# Patient Record
Sex: Female | Born: 1960 | Race: White | Hispanic: No | Marital: Married | Smoking: Former smoker
Health system: Southern US, Community
[De-identification: ages and names within clinical notes are randomized; demographics above are authoritative.]

## PROBLEM LIST (undated history)

## (undated) DIAGNOSIS — Z9989 Dependence on other enabling machines and devices: Secondary | ICD-10-CM

## (undated) DIAGNOSIS — Z83719 Family history of colon polyps, unspecified: Secondary | ICD-10-CM

## (undated) DIAGNOSIS — K589 Irritable bowel syndrome without diarrhea: Secondary | ICD-10-CM

## (undated) DIAGNOSIS — E785 Hyperlipidemia, unspecified: Secondary | ICD-10-CM

## (undated) DIAGNOSIS — K219 Gastro-esophageal reflux disease without esophagitis: Secondary | ICD-10-CM

## (undated) DIAGNOSIS — M549 Dorsalgia, unspecified: Secondary | ICD-10-CM

## (undated) DIAGNOSIS — Z8371 Family history of colonic polyps: Secondary | ICD-10-CM

## (undated) DIAGNOSIS — G4730 Sleep apnea, unspecified: Secondary | ICD-10-CM

## (undated) DIAGNOSIS — R7303 Prediabetes: Secondary | ICD-10-CM

## (undated) DIAGNOSIS — K5792 Diverticulitis of intestine, part unspecified, without perforation or abscess without bleeding: Secondary | ICD-10-CM

## (undated) DIAGNOSIS — M2550 Pain in unspecified joint: Secondary | ICD-10-CM

## (undated) DIAGNOSIS — G4733 Obstructive sleep apnea (adult) (pediatric): Secondary | ICD-10-CM

## (undated) DIAGNOSIS — T7840XA Allergy, unspecified, initial encounter: Secondary | ICD-10-CM

## (undated) DIAGNOSIS — K760 Fatty (change of) liver, not elsewhere classified: Secondary | ICD-10-CM

## (undated) HISTORY — DX: Irritable bowel syndrome, unspecified: K58.9

## (undated) HISTORY — DX: Dorsalgia, unspecified: M54.9

## (undated) HISTORY — DX: Obstructive sleep apnea (adult) (pediatric): G47.33

## (undated) HISTORY — DX: Diverticulitis of intestine, part unspecified, without perforation or abscess without bleeding: K57.92

## (undated) HISTORY — DX: Pain in unspecified joint: M25.50

## (undated) HISTORY — DX: Hyperlipidemia, unspecified: E78.5

## (undated) HISTORY — PX: BONE GRAFT HIP ILIAC CREST: SUR159

## (undated) HISTORY — PX: OTHER SURGICAL HISTORY: SHX169

## (undated) HISTORY — DX: Family history of colonic polyps: Z83.71

## (undated) HISTORY — DX: Allergy, unspecified, initial encounter: T78.40XA

## (undated) HISTORY — DX: Prediabetes: R73.03

## (undated) HISTORY — DX: Sleep apnea, unspecified: G47.30

## (undated) HISTORY — DX: Family history of colon polyps, unspecified: Z83.719

## (undated) HISTORY — DX: Fatty (change of) liver, not elsewhere classified: K76.0

## (undated) HISTORY — DX: Gastro-esophageal reflux disease without esophagitis: K21.9

## (undated) HISTORY — DX: Dependence on other enabling machines and devices: Z99.89

## (undated) HISTORY — PX: WRIST FRACTURE SURGERY: SHX121

---

## 2003-03-10 HISTORY — PX: CHOLECYSTECTOMY: SHX55

## 2003-03-10 HISTORY — PX: GALLBLADDER SURGERY: SHX652

## 2011-08-11 ENCOUNTER — Ambulatory Visit: Payer: Self-pay | Admitting: General Practice

## 2013-08-04 ENCOUNTER — Encounter: Payer: Self-pay | Admitting: Gastroenterology

## 2013-10-06 ENCOUNTER — Ambulatory Visit: Payer: Self-pay | Admitting: Gastroenterology

## 2014-01-02 ENCOUNTER — Ambulatory Visit: Payer: Self-pay | Admitting: General Practice

## 2014-09-28 DIAGNOSIS — M5430 Sciatica, unspecified side: Secondary | ICD-10-CM | POA: Insufficient documentation

## 2014-09-28 DIAGNOSIS — Z79890 Hormone replacement therapy (postmenopausal): Secondary | ICD-10-CM | POA: Insufficient documentation

## 2014-09-28 DIAGNOSIS — N951 Menopausal and female climacteric states: Secondary | ICD-10-CM | POA: Insufficient documentation

## 2014-09-28 DIAGNOSIS — M549 Dorsalgia, unspecified: Secondary | ICD-10-CM | POA: Insufficient documentation

## 2014-09-28 DIAGNOSIS — M7062 Trochanteric bursitis, left hip: Secondary | ICD-10-CM | POA: Insufficient documentation

## 2014-09-28 DIAGNOSIS — K219 Gastro-esophageal reflux disease without esophagitis: Secondary | ICD-10-CM | POA: Insufficient documentation

## 2014-09-28 DIAGNOSIS — M7751 Other enthesopathy of right foot: Secondary | ICD-10-CM | POA: Insufficient documentation

## 2014-10-29 ENCOUNTER — Ambulatory Visit: Payer: Self-pay

## 2014-11-15 ENCOUNTER — Ambulatory Visit: Payer: No Typology Code available for payment source | Attending: Family Medicine

## 2014-11-15 DIAGNOSIS — M25551 Pain in right hip: Secondary | ICD-10-CM | POA: Insufficient documentation

## 2014-11-15 DIAGNOSIS — M25659 Stiffness of unspecified hip, not elsewhere classified: Secondary | ICD-10-CM | POA: Insufficient documentation

## 2014-11-15 DIAGNOSIS — R29898 Other symptoms and signs involving the musculoskeletal system: Secondary | ICD-10-CM | POA: Insufficient documentation

## 2014-11-15 DIAGNOSIS — M25571 Pain in right ankle and joints of right foot: Secondary | ICD-10-CM | POA: Insufficient documentation

## 2014-11-15 NOTE — Patient Instructions (Signed)
HIP: Hamstrings - Short Sitting   Rest leg on raised surface. Keep knee straight. Lift chest. Hold _20__ seconds. _3__ reps per set, _3__ sets per day.  Copyright  VHI. All rights reserved.  Piriformis Stretch, Sitting   Sit, one ankle on opposite knee, same-side hand on crossed knee. Push down on knee, keeping spine straight. Lean torso forward, with flat back, until tension is felt in hamstrings and gluteals of crossed-leg side. Hold _20__ seconds.  Repeat 3___ times per session. Do 3___ sessions per day.  Copyright  VHI. All rights reserved.  Knee to Chest   Lying supine, bend involved knee to chest hold 20 seconds repeat _3__ times. Repeat with other leg. Do _3__ times per day.  Also, repeat with pulling toward opposite shoulder.    Copyright  VHI. All rights reserved.  IT Band: Leg Hang (Side-Lying)   Lie on side with right leg on top. Keep hip and knee straight. Move top leg behind and hang over edge. Hold _20__ seconds. Relax. Repeat _3__ times. Do _3__ times a day. Repeat on other side.    Copyright  VHI. All rights reserved.  [facility] [address], [place], [place] [address] Phone # [phone] Fax [phone]

## 2014-11-15 NOTE — Therapy (Signed)
[facility] [address] [place], [place], [address] Phone: [phone]   Fax:  [phone]  Physical Therapy Evaluation  Patient Details  Name: Sandra Lara MRN: [id number] Date of Birth: September 08, 1960 Referring Provider:  [name], MD  Encounter Date: 11/15/2014      PT End of Session - 11/15/14 1224    Visit Number 1   Date for PT Re-Evaluation 01/10/15   PT Start Time 1148   PT Stop Time 1224   PT Time Calculation (min) 36 min   Activity Tolerance Patient tolerated treatment well   Behavior During Therapy [facility] for tasks assessed/performed      Past Medical History  Diagnosis Date  . Diverticulitis     Past Surgical History  Procedure Laterality Date  . Cholecystectomy  2005  . Wrist fracture surgery Right     There were no vitals filed for this visit.  Visit Diagnosis:  Hip pain, right - Plan: PT plan of care cert/re-cert  Pain in joint, ankle and foot, right - Plan: PT plan of care cert/re-cert  Hip stiffness, unspecified laterality - Plan: PT plan of care cert/re-cert  Weakness of hip, unspecified laterality - Plan: PT plan of care cert/re-cert      Subjective Assessment - 11/15/14 1155    Subjective Pt presents to PT with complaints of Rt lateral hip pain with 7 year history.  Pt lifted her leg at that time and felt a pop.  Pt reports that pain has increaesed over the past 1 year.     Pertinent History injection into Rt bursa 2 weeks ago- this helped   Limitations Sitting   How long can you sit comfortably? 15 minutes    Diagnostic tests x-ray: hip and low back, foot.  Pt reports lumbar OA, Rt hip bursitis   Patient Stated Goals sit longer, less pain, climb steps with greater ease   Currently in Pain? Yes   Pain Score 4    Pain Location Hip   Pain Orientation Right   Pain Descriptors / Indicators Aching;Sore   Pain Type Chronic pain   Pain Onset More than a month ago   Pain  Frequency Intermittent   Aggravating Factors  sitting, climibing steps/ladder, laying on Rt side in bed   Pain Relieving Factors heat, stretching, tennis ball/theracane   Multiple Pain Sites No            [facility] PT Assessment - 11/15/14 0001    Assessment   Medical Diagnosis greater trochanteric bursitis of the Rt hip, postcalcaneal bursisit of Rt foot, back pain with sciatica   Onset Date/Surgical Date 11/14/13   Next MD Visit 12/2014   Precautions   Precautions None   Restrictions   Weight Bearing Restrictions No   Balance Screen   Has the patient fallen in the past 6 months Yes   How many times? 1  getting out of bath tub   Has the patient had a decrease in activity level because of a fear of falling?  No   Is the patient reluctant to leave their home because of a fear of falling?  No   Home Environment   Living Environment Private residence   Living Arrangements Spouse/significant other   Type of [place] One level   Prior Function   Level of Independence Independent   Vocation Full time employment   Development worker, community- desk work   Leisure none  Cognition   Overall Cognitive Status Within Functional Limits for tasks assessed   Observation/Other Assessments   Focus on Therapeutic Outcomes (FOTO)  46% limitation   Posture/Postural Control   Posture/Postural Control No significant limitations   ROM / Strength   AROM / PROM / Strength AROM;PROM;Strength   AROM   Overall AROM  Deficits   Overall AROM Comments Lumbar AROM limited by 10-20% in all directions without pain. Rt ankle AROM is full without pain.   PROM   Overall PROM  Deficits   Overall PROM Comments Bil hip flexibility limited by 25% into IR and hamstring flexibility   Strength   Overall Strength Deficits   Overall Strength Comments Bilateral knee strength 5/5, hip flexors 4/5, ankle strength 5/5 on the Rt   Palpation   Palpation comment Pt wtih palpable tenderness over Rt  ITB and greater trochanter.  No tenderness in deep gluteals.  No tenderness over Rt achilles.  Palpable bump present in posterior Rt ankle.     Ambulation/Gait   Ambulation/Gait Yes   Ambulation/Gait Assistance 7: Independent   Gait Pattern Within Functional Limits                           PT Education - 11/15/14 1224    Education provided Yes   Education Details HEP: single knee and diagonal knee to chest, seated piriformis, ITB stretch, hamstring stretchg   Person(s) Educated Patient   Methods Explanation;Demonstration;Handout   Comprehension Verbalized understanding;Returned demonstration          PT Short Term Goals - 11/15/14 1228    PT SHORT TERM GOAL #1   Title be independent in initial HEP   Time 4   Period Weeks   Status New   PT SHORT TERM GOAL #2   Title sit for 30 minutes without limitation for work   Time 4   Period Weeks   Status New   PT SHORT TERM GOAL #3   Title report a 25% reduction in Rt hip pain with activity   Time 4   Period Weeks   Status New           PT Long Term Goals - 11/15/14 1229    PT LONG TERM GOAL #1   Title be independent in advanced HEP   Time 8   Period Weeks   Status New   PT LONG TERM GOAL #2   Title reduce FOTO to < or = to 39% limitation   Time 8   Period Weeks   Status New   PT LONG TERM GOAL #3   Title sit for 45 minutes to 1 hour without limitation for work   Time 8   Period Weeks   Status New   PT LONG TERM GOAL #4   Title report a 60% reduction in Rt hip pain with activity   Time 8   Period Weeks   Status New   PT LONG TERM GOAL #5   Title ascend and descend steps with step-over-step gait at least 75% of the time   Time 8   Period Weeks   Status New   Additional Long Term Goals   Additional Long Term Goals Yes   PT LONG TERM GOAL #6   Title report a 50% reduction in Rt ankle pain with long periods of walking   Time 8   Period Weeks   Status New  Plan -  11/15/14 1225    Clinical Impression Statement Pt presents to PT with main complaint of chronic Rt hip pain and now diagnosed with trochanteric bursitis.  Pt  also with complatins of Rt ankle pain that she feels is due to altered gait secondary to hip pain. Pt demonstrates stiffness in bilateral hips, painful palpation over Rt lateral hip and thigh.  Pt is limited to sitting < 15 minutes.  FOTO score is 46% limitation.  Pt will benefit from skilled PT for hip flexibility, manual and modalities for pain, Rt ankle flexibility and strength and core stab.     Pt will benefit from skilled therapeutic intervention in order to improve on the following deficits Decreased range of motion;Pain;Decreased strength;Decreased activity tolerance;Impaired flexibility   Rehab Potential Good   PT Frequency 2x / week   PT Duration 8 weeks   PT Treatment/Interventions ADLs/Self Care Home Management;Cryotherapy;Electrical Stimulation;Moist Heat;Therapeutic exercise;Therapeutic activities;Iontophoresis 4mg /ml Dexamethasone;Functional mobility training;Stair training;Neuromuscular re-education;Patient/family education;Manual techniques;Passive range of motion   PT Next Visit Plan Rt ankle flexibility into DF, Rt hip flexibility, manual to Rt ITB, core strength.     Consulted and Agree with Plan of Care Patient         Problem List There are no active problems to display for this patient.   Terressa Evola, PT 11/15/2014, 12:32 PM   [facility] [address], [place], [place], [address] Phone: [phone]   Fax:  [phone]

## 2014-11-20 ENCOUNTER — Ambulatory Visit: Payer: No Typology Code available for payment source | Admitting: Physical Therapy

## 2014-11-20 DIAGNOSIS — M25571 Pain in right ankle and joints of right foot: Secondary | ICD-10-CM

## 2014-11-20 DIAGNOSIS — M25551 Pain in right hip: Secondary | ICD-10-CM | POA: Diagnosis not present

## 2014-11-20 DIAGNOSIS — R29898 Other symptoms and signs involving the musculoskeletal system: Secondary | ICD-10-CM

## 2014-11-20 DIAGNOSIS — M25659 Stiffness of unspecified hip, not elsewhere classified: Secondary | ICD-10-CM

## 2014-11-20 NOTE — Patient Instructions (Signed)
  Abduction: Clam (Eccentric) - Side-Lying   Lie on side with knees bent. Lift top knee, keeping feet together. Keep trunk steady. Slowly lower for 3-5 seconds. ___ reps per set, ___ sets per day, ___ days per week. Add ___ lbs when you achieve ___ repetitions.  Copyright  VHI. All rights reserved.  Abduction   Lift leg up toward ceiling. Return. Use ____ lbs on ankle. Repeat ____ times each leg. Do ____ sessions per day.  http://gt2.exer.us/386   Copyright  VHI. All rights reserved.  Bridge   Lie back, legs bent. Inhale, pressing hips up. Keeping ribs in, lengthen lower back. Exhale, rolling down along spine from top. Repeat ____ times. Do ____ sessions per day.  Copyright  VHI. All rights reserved.

## 2014-11-20 NOTE — Therapy (Signed)
[facility] [address] [place], [place], [address] Phone: [phone]   Fax:  [phone]  Physical Therapy Treatment  Patient Details  Name: Sandra Lara MRN: [id number] Date of Birth: [birth year] Referring Provider:  [name], MD  Encounter Date: 11/20/2014      PT End of Session - 11/20/14 0842    Visit Number 2   Date for PT Re-Evaluation 01/10/15   PT Start Time 0800   PT Stop Time 0850   PT Time Calculation (min) 50 min   Activity Tolerance Patient tolerated treatment well   Behavior During Therapy [facility] for tasks assessed/performed      Past Medical History  Diagnosis Date  . Diverticulitis     Past Surgical History  Procedure Laterality Date  . Cholecystectomy  2005  . Wrist fracture surgery Right     There were no vitals filed for this visit.  Visit Diagnosis:  Hip pain, right  Pain in joint, ankle and foot, right  Hip stiffness, unspecified laterality  Weakness of hip, unspecified laterality      Subjective Assessment - 11/20/14 0815    Subjective Stretches have helped, still with difficulty sleeping on Rt side, sitting for long periods  Pt states Rt ankle feels better since doing stretches                         [facility] Adult PT Treatment/Exercise - 11/20/14 0001    Lumbar Exercises: Stretches   Active Hamstring Stretch 2 reps;30 seconds   Single Knee to Chest Stretch 2 reps;30 seconds   ITB Stretch 2 reps;30 seconds   Piriformis Stretch 2 reps;30 seconds   Lumbar Exercises: Supine   Bridge 10 reps;3 seconds   Other Supine Lumbar Exercises bridge with ball squeeze 10x 3 sec   Other Supine Lumbar Exercises bridge with march 10 x   Knee/Hip Exercises: Aerobic   Stationary Bike nustep level 2 x 5 minutes   Knee/Hip Exercises: Sidelying   Hip ABduction Right;2 sets;10 reps   Clams 2 x 10   Moist Heat Therapy   Number Minutes Moist Heat 10 Minutes   Moist Heat Location Hip   Manual Therapy   Soft tissue mobilization Rt ITB, Rt piriformis                PT Education - 11/20/14 0842    Education provided Yes   Education Details HEP   Methods Explanation;Demonstration;Handout   Comprehension Returned demonstration;Verbalized understanding          PT Short Term Goals - 11/20/14 0844    PT SHORT TERM GOAL #1   Status On-going   PT SHORT TERM GOAL #2   Status On-going   PT SHORT TERM GOAL #3   Status On-going           PT Long Term Goals - 11/15/14 1229    PT LONG TERM GOAL #1   Title be independent in advanced HEP   Time 8   Period Weeks   Status New   PT LONG TERM GOAL #2   Title reduce FOTO to < or = to 39% limitation   Time 8   Period Weeks   Status New   PT LONG TERM GOAL #3   Title sit for 45 minutes to 1 hour without limitation for work   Time 8   Period Weeks   Status New   PT LONG TERM GOAL #  4   Title report a 60% reduction in Rt hip pain with activity   Time 8   Period Weeks   Status New   PT LONG TERM GOAL #5   Title ascend and descend steps with step-over-step gait at least 75% of the time   Time 8   Period Weeks   Status New   Additional Long Term Goals   Additional Long Term Goals Yes   PT LONG TERM GOAL #6   Title report a 50% reduction in Rt ankle pain with long periods of walking   Time 8   Period Weeks   Status New               Plan - 11/20/14 7341    Clinical Impression Statement Pt able to tolerate core and hip strengthening exercises easily.  Still with tightness in ITB and piriformis limited ability to sit for long periods and sleep on Rt side.   PT Frequency 2x / week   PT Duration 8 weeks   PT Treatment/Interventions ADLs/Self Care Home Management;Cryotherapy;Electrical Stimulation;Moist Heat;Therapeutic exercise;Therapeutic activities;Iontophoresis 4mg /ml Dexamethasone;Functional mobility training;Stair training;Neuromuscular re-education;Patient/family  education;Manual techniques;Passive range of motion   PT Next Visit Plan Continue to progress core and hip strengthening        Problem List There are no active problems to display for this patient.   Mehmet Scally 11/20/2014, 9:27 AM  [facility] [address] [place], [place], [address] Phone: [phone]   Fax:  [phone]

## 2014-11-22 ENCOUNTER — Ambulatory Visit: Payer: No Typology Code available for payment source

## 2014-11-22 DIAGNOSIS — R29898 Other symptoms and signs involving the musculoskeletal system: Secondary | ICD-10-CM

## 2014-11-22 DIAGNOSIS — M25571 Pain in right ankle and joints of right foot: Secondary | ICD-10-CM

## 2014-11-22 DIAGNOSIS — M25659 Stiffness of unspecified hip, not elsewhere classified: Secondary | ICD-10-CM

## 2014-11-22 DIAGNOSIS — M25551 Pain in right hip: Secondary | ICD-10-CM | POA: Diagnosis not present

## 2014-11-22 NOTE — Therapy (Signed)
[facility] [address] [place], [place], [address] Phone: [phone]   Fax:  [phone]  Physical Therapy Treatment  Patient Details  Name: Eufelia Veno MRN: [id number] Date of Birth: Jun 18, 1960 Referring Provider:  [name], MD  Encounter Date: 11/22/2014      PT End of Session - 11/22/14 0842    Visit Number 3   Date for PT Re-Evaluation 01/10/15   PT Start Time 0803   PT Stop Time 0856   PT Time Calculation (min) 53 min   Activity Tolerance Patient tolerated treatment well   Behavior During Therapy [facility] for tasks assessed/performed      Past Medical History  Diagnosis Date  . Diverticulitis     Past Surgical History  Procedure Laterality Date  . Cholecystectomy  2005  . Wrist fracture surgery Right     There were no vitals filed for this visit.  Visit Diagnosis:  Hip pain, right  Pain in joint, ankle and foot, right  Hip stiffness, unspecified laterality  Weakness of hip, unspecified laterality      Subjective Assessment - 11/22/14 0815    Subjective Pt feels that hip is feeling better.  No pain today.     Currently in Pain? No/denies                         [facility] Adult PT Treatment/Exercise - 11/22/14 0001    Exercises   Exercises Ankle   Lumbar Exercises: Stretches   Active Hamstring Stretch 2 reps;30 seconds   ITB Stretch 2 reps;30 seconds  with strap   Piriformis Stretch 2 reps;30 seconds   Lumbar Exercises: Aerobic   Stationary Bike Level 2 x 8 minutes   Lumbar Exercises: Supine   Bridge 10 reps;3 seconds   Other Supine Lumbar Exercises bridge with ball squeeze 10x 3 sec   Other Supine Lumbar Exercises bridge with march 10 x   Lumbar Exercises: Sidelying   Clam 20 reps   Knee/Hip Exercises: Standing   Rebounder weight shifting 3 ways x 1 minute each   Moist Heat Therapy   Number Minutes Moist Heat 14 Minutes   Moist Heat Location Hip   Manual Therapy   Soft tissue mobilization Rt ITB, Rt piriformis   Ankle Exercises: Stretches   Gastroc Stretch 5 reps;10 seconds  on edge of step                  PT Short Term Goals - 11/20/14 0844    PT SHORT TERM GOAL #1   Status On-going   PT SHORT TERM GOAL #2   Status On-going   PT SHORT TERM GOAL #3   Status On-going           PT Long Term Goals - 11/15/14 1229    PT LONG TERM GOAL #1   Title be independent in advanced HEP   Time 8   Period Weeks   Status New   PT LONG TERM GOAL #2   Title reduce FOTO to < or = to 39% limitation   Time 8   Period Weeks   Status New   PT LONG TERM GOAL #3   Title sit for 45 minutes to 1 hour without limitation for work   Time 8   Period Weeks   Status New   PT LONG TERM GOAL #4   Title report a 60% reduction in Rt hip pain with activity  Time 8   Period Weeks   Status New   PT LONG TERM GOAL #5   Title ascend and descend steps with step-over-step gait at least 75% of the time   Time 8   Period Weeks   Status New   Additional Long Term Goals   Additional Long Term Goals Yes   PT LONG TERM GOAL #6   Title report a 50% reduction in Rt ankle pain with long periods of walking   Time 8   Period Weeks   Status New               Plan - 11/22/14 5631    Clinical Impression Statement Pt without pain in hip or ankle today.  Pt able to demo all HEP correctly and is compliant with HEP.  Pt with continued tenderness in Rt ITB and piriformis that limits long periods and sleep on the Rt side.  Pt will benefit from skilled PT for flexibility, strength, manual and modalities.     Pt will benefit from skilled therapeutic intervention in order to improve on the following deficits Decreased range of motion;Pain;Decreased strength;Decreased activity tolerance;Impaired flexibility   PT Frequency 2x / week   PT Duration 8 weeks   PT Treatment/Interventions ADLs/Self Care Home Management;Cryotherapy;Electrical  Stimulation;Moist Heat;Therapeutic exercise;Therapeutic activities;Iontophoresis 4mg /ml Dexamethasone;Functional mobility training;Stair training;Neuromuscular re-education;Patient/family education;Manual techniques;Passive range of motion   PT Next Visit Plan Continue to progress core and hip strengthening.  Manual and modalites.   Consulted and Agree with Plan of Care Patient        Problem List There are no active problems to display for this patient.   TAKACS,KELLY, PT 11/22/2014, 8:43 AM  [facility] [address], [place], [place], [address] Phone: [phone]   Fax:  [phone]

## 2014-11-28 ENCOUNTER — Ambulatory Visit: Payer: No Typology Code available for payment source

## 2014-11-28 DIAGNOSIS — M25571 Pain in right ankle and joints of right foot: Secondary | ICD-10-CM

## 2014-11-28 DIAGNOSIS — M25659 Stiffness of unspecified hip, not elsewhere classified: Secondary | ICD-10-CM

## 2014-11-28 DIAGNOSIS — R29898 Other symptoms and signs involving the musculoskeletal system: Secondary | ICD-10-CM

## 2014-11-28 DIAGNOSIS — M25551 Pain in right hip: Secondary | ICD-10-CM | POA: Diagnosis not present

## 2014-11-28 NOTE — Patient Instructions (Signed)
  ABDUCTION: Standing (Active)   Stand, feet flat. Lift right leg out to side. Use _0__ lbs. Complete __10_ repetitions. Perform __2_ sessions per day.     EXTENSION: Standing (Active)  Stand, both feet flat. Draw right leg behind body as far as possible. Use 0___ lbs. Complete 10 repetitions. Perform __2_ sessions per day.  Copyright  VHI. All rights reserved.   [facility] [address], [address] [place], [place] [address] Phone # [phone] Fax [phone] 

## 2014-11-28 NOTE — Therapy (Signed)
[facility] [address], [place], [place], [address] Phone: [phone]   Fax:  [phone]  Physical Therapy Treatment  Patient Details  Name: Sandra Lara MRN: [id number] Date of Birth: [birth year] Referring Provider:  [name], MD  Encounter Date: 11/28/2014      PT End of Session - 11/28/14 0834    Visit Number 4   Date for PT Re-Evaluation 01/10/15   PT Start Time 0759   PT Stop Time 0852   PT Time Calculation (min) 53 min   Activity Tolerance Patient tolerated treatment well   Behavior During Therapy [facility] for tasks assessed/performed      Past Medical History  Diagnosis Date  . Diverticulitis     Past Surgical History  Procedure Laterality Date  . Cholecystectomy  2005  . Wrist fracture surgery Right     There were no vitals filed for this visit.  Visit Diagnosis:  Hip pain, right  Pain in joint, ankle and foot, right  Hip stiffness, unspecified laterality  Weakness of hip, unspecified laterality      Subjective Assessment - 11/28/14 0801    Subjective No pain.  Doing well with exercises.     Currently in Pain? No/denies                         [facility] Adult PT Treatment/Exercise - 11/28/14 0001    Lumbar Exercises: Stretches   Active Hamstring Stretch 2 reps;30 seconds  using step for stretch and strap in supine   Piriformis Stretch 2 reps;30 seconds  seated   Lumbar Exercises: Aerobic   Stationary Bike Level 2 x 8 minutes   Lumbar Exercises: Supine   Bridge 20 reps;5 seconds;Other (comment)  with ball squeeze   Knee/Hip Exercises: Standing   Heel Raises 2 sets;10 reps   Hip Abduction 2 sets;10 reps;Both   Hip Extension Both;2 sets;10 reps   Rebounder weight shifting 3 ways x 1 minute each   Knee/Hip Exercises: Sidelying   Clams 3x10 with yellow band.   issued yellow band for HEP   Moist Heat Therapy   Number Minutes Moist Heat 12 Minutes   Moist  Heat Location Hip   Manual Therapy   Soft tissue mobilization Rt ITB, Rt piriformis  using orange roller                PT Education - 11/28/14 0821    Education provided Yes   Education Details HEP: standing hip abduction and extension   Person(s) Educated Patient   Methods Explanation;Demonstration;Handout   Comprehension Verbalized understanding;Returned demonstration          PT Short Term Goals - 11/28/14 0801    PT SHORT TERM GOAL #1   Title be independent in initial HEP   Status Achieved   PT SHORT TERM GOAL #2   Title sit for 30 minutes without limitation for work   Status Achieved   PT SHORT TERM GOAL #3   Title report a 25% reduction in Rt hip pain with activity   Status Achieved           PT Long Term Goals - 11/15/14 1229    PT LONG TERM GOAL #1   Title be independent in advanced HEP   Time 8   Period Weeks   Status New   PT LONG TERM GOAL #2   Title reduce FOTO to < or = to 39% limitation  Time 8   Period Weeks   Status New   PT LONG TERM GOAL #3   Title sit for 45 minutes to 1 hour without limitation for work   Time 8   Period Weeks   Status New   PT LONG TERM GOAL #4   Title report a 60% reduction in Rt hip pain with activity   Time 8   Period Weeks   Status New   PT LONG TERM GOAL #5   Title ascend and descend steps with step-over-step gait at least 75% of the time   Time 8   Period Weeks   Status New   Additional Long Term Goals   Additional Long Term Goals Yes   PT LONG TERM GOAL #6   Title report a 50% reduction in Rt ankle pain with long periods of walking   Time 8   Period Weeks   Status New               Plan - 11/28/14 0802    Clinical Impression Statement Pt without pain x 1 week.  Pt is able to sit for 30 minutes at work without limitation.  Pt reports 30% reduciton in hip pain since the start of pain . Pain only with sidelying on the Rt.  Pt with continued tenderness in the Rt ITB and piriformis and  limites sitting for long periods and sleep on the Rt side.  Pt will benefit from skilled PT for flexibility, strength, manual and modalities.     Pt will benefit from skilled therapeutic intervention in order to improve on the following deficits Decreased range of motion;Pain;Decreased strength;Decreased activity tolerance;Impaired flexibility   Rehab Potential Good   PT Frequency 2x / week   PT Duration 8 weeks   PT Treatment/Interventions ADLs/Self Care Home Management;Cryotherapy;Electrical Stimulation;Moist Heat;Therapeutic exercise;Therapeutic activities;Iontophoresis 4mg /ml Dexamethasone;Functional mobility training;Stair training;Neuromuscular re-education;Patient/family education;Manual techniques;Passive range of motion   PT Next Visit Plan Continue to progress core and hip strengthening.  Manual and modalites.   Consulted and Agree with Plan of Care Patient        Problem List There are no active problems to display for this patient.   TAKACS,KELLY,PT 11/28/2014, 8:41 AM  [facility] [address] [place], [place], [address] Phone: [phone]   Fax:  [phone]

## 2014-12-11 ENCOUNTER — Ambulatory Visit: Payer: No Typology Code available for payment source | Attending: Family Medicine

## 2014-12-11 DIAGNOSIS — R29898 Other symptoms and signs involving the musculoskeletal system: Secondary | ICD-10-CM | POA: Insufficient documentation

## 2014-12-11 DIAGNOSIS — M25551 Pain in right hip: Secondary | ICD-10-CM | POA: Diagnosis present

## 2014-12-11 DIAGNOSIS — M25571 Pain in right ankle and joints of right foot: Secondary | ICD-10-CM

## 2014-12-11 DIAGNOSIS — M25659 Stiffness of unspecified hip, not elsewhere classified: Secondary | ICD-10-CM | POA: Diagnosis present

## 2014-12-11 NOTE — Therapy (Signed)
[facility] [address], [place], [place], [address] Phone: [phone]   Fax:  [phone]  Physical Therapy Treatment  Patient Details  Name: Sandra Lara MRN: [id number] Date of Birth: 02-Jun-1960 Referring Provider:  [name], MD  Encounter Date: 12/11/2014      PT End of Session - 12/11/14 0840    Visit Number 5   Date for PT Re-Evaluation 01/10/15   PT Start Time 0801   PT Stop Time 3888   PT Time Calculation (min) 54 min   Activity Tolerance Patient tolerated treatment well   Behavior During Therapy [facility] for tasks assessed/performed      Past Medical History  Diagnosis Date  . Diverticulitis     Past Surgical History  Procedure Laterality Date  . Cholecystectomy  2005  . Wrist fracture surgery Right     There were no vitals filed for this visit.  Visit Diagnosis:  Hip pain, right  Pain in joint, ankle and foot, right  Hip stiffness, unspecified laterality  Weakness of hip, unspecified laterality      Subjective Assessment - 12/11/14 0809    Subjective Pt was on vacation to the beach.  Walked a lot on unlevel surface at [employer].  Hasn't been stretching as much.     Currently in Pain? Yes   Pain Score 4    Pain Location Hip   Pain Orientation Right   Pain Descriptors / Indicators Aching;Sore   Pain Type Chronic pain   Pain Onset More than a month ago   Pain Frequency Intermittent   Aggravating Factors  sitting, walking on unlevel surface   Pain Relieving Factors heat, stretching                         [facility] Adult PT Treatment/Exercise - 12/11/14 0001    Lumbar Exercises: Stretches   Active Hamstring Stretch 2 reps;30 seconds  using step for stretch and strap in supine   Lumbar Exercises: Aerobic   Stationary Bike Level 2 x 8 minutes   Lumbar Exercises: Supine   Bridge 20 reps;5 seconds;Other (comment)  with ball squeeze   Other Supine Lumbar  Exercises bridge with ball squeeze 10x 3 sec   Lumbar Exercises: Sidelying   Clam 20 reps  yellow band   Knee/Hip Exercises: Standing   Heel Raises 2 sets;10 reps   Hip Abduction 2 sets;10 reps;Both   Hip Extension Both;2 sets;10 reps   Knee/Hip Exercises: Sidelying   Clams 3x10 with yellow band.   issued yellow band for HEP   Moist Heat Therapy   Number Minutes Moist Heat 12 Minutes   Moist Heat Location Hip   Manual Therapy   Soft tissue mobilization Rt ITB, Rt piriformis  using orange roller   Ankle Exercises: Stretches   Gastroc Stretch 5 reps;10 seconds  on edge of step                  PT Short Term Goals - 11/28/14 0801    PT SHORT TERM GOAL #1   Title be independent in initial HEP   Status Achieved   PT SHORT TERM GOAL #2   Title sit for 30 minutes without limitation for work   Status Achieved   PT SHORT TERM GOAL #3   Title report a 25% reduction in Rt hip pain with activity   Status Achieved  PT Long Term Goals - 12/11/14 0810    PT LONG TERM GOAL #1   Title be independent in advanced HEP   Time 8   Period Weeks   Status On-going  pt has current HEP and was not compliant while on vacation   PT LONG TERM GOAL #3   Title sit for 45 minutes to 1 hour without limitation for work   Time 8   Period Weeks   Status On-going  30 minutes with hip pain   PT LONG TERM GOAL #4   Title report a 60% reduction in Rt hip pain with activity   Time 8   Period Weeks   Status On-going  40% reduction   PT LONG TERM GOAL #5   Title ascend and descend steps with step-over-step gait at least 75% of the time   Status Achieved               Plan - 12/11/14 0813    Clinical Impression Statement Pt reports 40% reduction in Rt hip pain since the start of care.  Sitting remains limited to 30 minutes at work.  Pt has not been stretching as much due to being on vacation.  Pt with continued tenderness in the Rt ITB and piriformis and this limits  sitting for long periods of teim.  Pt will benefit from skilled PT for flexibiity and strength progression and manual for pain management.   Pt will benefit from skilled therapeutic intervention in order to improve on the following deficits Decreased range of motion;Pain;Decreased strength;Decreased activity tolerance;Impaired flexibility   Rehab Potential Good   PT Frequency 2x / week   PT Duration 8 weeks   PT Treatment/Interventions ADLs/Self Care Home Management;Cryotherapy;Electrical Stimulation;Moist Heat;Therapeutic exercise;Therapeutic activities;Iontophoresis 4mg /ml Dexamethasone;Functional mobility training;Stair training;Neuromuscular re-education;Patient/family education;Manual techniques;Passive range of motion   PT Next Visit Plan Continue to progress core and hip strengthening.  Manual and modalites.   Consulted and Agree with Plan of Care Patient        Problem List There are no active problems to display for this patient.   TAKACS,KELLY, PT 12/11/2014, 8:41 AM  [facility] [address] [place], [place], [address] Phone: [phone]   Fax:  [phone]

## 2014-12-13 ENCOUNTER — Encounter: Payer: No Typology Code available for payment source | Admitting: Physical Therapy

## 2014-12-18 ENCOUNTER — Ambulatory Visit: Payer: No Typology Code available for payment source

## 2014-12-18 DIAGNOSIS — M25551 Pain in right hip: Secondary | ICD-10-CM | POA: Diagnosis not present

## 2014-12-18 DIAGNOSIS — M25571 Pain in right ankle and joints of right foot: Secondary | ICD-10-CM

## 2014-12-18 DIAGNOSIS — M25659 Stiffness of unspecified hip, not elsewhere classified: Secondary | ICD-10-CM

## 2014-12-18 DIAGNOSIS — R29898 Other symptoms and signs involving the musculoskeletal system: Secondary | ICD-10-CM

## 2014-12-18 NOTE — Therapy (Signed)
[facility] [address] [place], [place], [address] Phone: [phone]   Fax:  [phone]  Physical Therapy Treatment  Patient Details  Name: Sandra Lara MRN: [id number] Date of Birth: September 08, 1960 Referring Provider:  [name], MD  Encounter Date: 12/18/2014      PT End of Session - 12/18/14 0828    Visit Number 6   Date for PT Re-Evaluation 01/10/15   PT Start Time 0759   PT Stop Time 0852   PT Time Calculation (min) 53 min   Activity Tolerance Patient tolerated treatment well   Behavior During Therapy [facility] for tasks assessed/performed      Past Medical History  Diagnosis Date  . Diverticulitis     Past Surgical History  Procedure Laterality Date  . Cholecystectomy  2005  . Wrist fracture surgery Right     There were no vitals filed for this visit.  Visit Diagnosis:  Hip pain, right  Hip stiffness, unspecified laterality  Pain in joint, ankle and foot, right  Weakness of hip, unspecified laterality      Subjective Assessment - 12/18/14 0808    Subjective Pt reports aching this morning due to cold weather   Currently in Pain? Yes   Pain Score 5    Pain Location Hip   Pain Orientation Right   Pain Descriptors / Indicators Aching;Sore   Pain Type Chronic pain   Pain Onset More than a month ago   Pain Frequency Intermittent   Aggravating Factors  sitting, walking on unlevel surface   Pain Relieving Factors heat, stretching                         [facility] Adult PT Treatment/Exercise - 12/18/14 0001    Lumbar Exercises: Stretches   Active Hamstring Stretch 2 reps;30 seconds  using step for stretch and strap in supine   Lumbar Exercises: Aerobic   Stationary Bike Level 2 x 8 minutes   Lumbar Exercises: Supine   Bridge 20 reps;5 seconds;Other (comment)  added hip abduction to bridge   Lumbar Exercises: Sidelying   Clam 20 reps  red band   Moist Heat Therapy    Number Minutes Moist Heat 12 Minutes   Moist Heat Location Hip   Manual Therapy   Soft tissue mobilization Rt ITB, Rt piriformis  using orange roller   Ankle Exercises: Stretches   Gastroc Stretch 5 reps;10 seconds  on edge of step                  PT Short Term Goals - 11/28/14 0801    PT SHORT TERM GOAL #1   Title be independent in initial HEP   Status Achieved   PT SHORT TERM GOAL #2   Title sit for 30 minutes without limitation for work   Status Achieved   PT SHORT TERM GOAL #3   Title report a 25% reduction in Rt hip pain with activity   Status Achieved           PT Long Term Goals - 12/18/14 0809    PT LONG TERM GOAL #1   Title be independent in advanced HEP   Time 8   Period Weeks   Status On-going   PT LONG TERM GOAL #2   Title reduce FOTO to < or = to 39% limitation   Time 8   Period Weeks   Status On-going  not tested yet  PT LONG TERM GOAL #3   Title sit for 45 minutes to 1 hour without limitation for work   Time 8   Period Weeks   Status On-going   PT LONG TERM GOAL #4   Title report a 60% reduction in Rt hip pain with activity   Time 8   Period Weeks   Status On-going  50% improvement   PT LONG TERM GOAL #5   Title ascend and descend steps with step-over-step gait at least 75% of the time   Status Achieved   PT LONG TERM GOAL #6   Title report a 50% reduction in Rt ankle pain with long periods of walking   Time 8   Period Weeks   Status Achieved  60% better               Plan - 12/18/14 0820    Clinical Impression Statement Pt reports 50% reduction in Rt hip pain and 60% reduction in Rt ankle pain since the start of care. Pt reports that sitting remains limited to 30 minutes at work.  Pt has resumed her stretching routine and HEP for strength.  PT with continued tenderness in the Rt ITB and piriformis and this limits sitting long periods of time.  Pt tolerated increased resistance with clam shells and abduction with  bridging.  Pt will benefit from skilled PT for flexibility and strength progression and manual therapy for pain management.     Pt will benefit from skilled therapeutic intervention in order to improve on the following deficits Decreased range of motion;Pain;Decreased strength;Decreased activity tolerance;Impaired flexibility   Rehab Potential Good   PT Frequency 2x / week   PT Duration 8 weeks   PT Treatment/Interventions ADLs/Self Care Home Management;Cryotherapy;Electrical Stimulation;Moist Heat;Therapeutic exercise;Therapeutic activities;Iontophoresis 4mg /ml Dexamethasone;Functional mobility training;Stair training;Neuromuscular re-education;Patient/family education;Manual techniques;Passive range of motion   PT Next Visit Plan Continue to progress core and hip strengthening.  Manual and modalites.   Consulted and Agree with Plan of Care Patient        Problem List There are no active problems to display for this patient.   TAKACS,KELLY, PT 12/18/2014, 8:38 AM  [facility] [address] [place], [place], [address] Phone: [phone]   Fax:  [phone]

## 2014-12-27 ENCOUNTER — Ambulatory Visit: Payer: No Typology Code available for payment source | Admitting: Physical Therapy

## 2014-12-27 DIAGNOSIS — M25551 Pain in right hip: Secondary | ICD-10-CM | POA: Diagnosis not present

## 2014-12-27 DIAGNOSIS — R29898 Other symptoms and signs involving the musculoskeletal system: Secondary | ICD-10-CM

## 2014-12-27 DIAGNOSIS — M25571 Pain in right ankle and joints of right foot: Secondary | ICD-10-CM

## 2014-12-27 DIAGNOSIS — M25659 Stiffness of unspecified hip, not elsewhere classified: Secondary | ICD-10-CM

## 2014-12-27 NOTE — Therapy (Addendum)
[facility] [address] [place], [place], [address] Phone: [phone]   Fax:  [phone]  Physical Therapy Treatment  Patient Details  Name: Sandra Lara MRN: [id number] Date of Birth: 1960-07-09 No Data Ernst Bowler, MD  Encounter Date: 12/27/2014      PT End of Session - 12/27/14 0842    Visit Number 7   Date for PT Re-Evaluation 01/10/15   PT Start Time 0759   PT Stop Time 0840   PT Time Calculation (min) 41 min   Activity Tolerance Patient tolerated treatment well   Behavior During Therapy [facility] for tasks assessed/performed      Past Medical History  Diagnosis Date  . Diverticulitis     Past Surgical History  Procedure Laterality Date  . Cholecystectomy  2005  . Wrist fracture surgery Right     There were no vitals filed for this visit.  Visit Diagnosis:  Hip pain, right  Hip stiffness, unspecified laterality  Pain in joint, ankle and foot, right  Weakness of hip, unspecified laterality      Subjective Assessment - 12/27/14 0804    Subjective Pt reports Lt knee hurt after last session, Rt hip feeling better   Currently in Pain? Yes   Pain Score 5    Pain Location Hip   Pain Orientation Right   Pain Descriptors / Indicators Aching   Pain Type Chronic pain   Pain Onset More than a month ago                         [facility] Adult PT Treatment/Exercise - 12/27/14 0001    Lumbar Exercises: Stretches   Active Hamstring Stretch 2 reps;30 seconds  using step to stretch   Lumbar Exercises: Aerobic   Stationary Bike level 2 x 8 minutes   Lumbar Exercises: Supine   Bridge 20 reps;5 seconds  10 reps with ball squeeze   Other Supine Lumbar Exercises bridge with march x 10   Lumbar Exercises: Sidelying   Clam 20 reps  red band, Lt sidelying only due to Rt pain   Knee/Hip Exercises: Standing   Hip ADduction Right;10 reps  red band   Hip Abduction Right;10 reps   red band   Hip Extension Right;10 reps  red band   Manual Therapy   Soft tissue mobilization R ITB and piriformis  orange roller   Ankle Exercises: Stretches   Gastroc Stretch 5 reps;10 seconds  on edge of step                  PT Short Term Goals - 12/27/14 0844    PT SHORT TERM GOAL #1   Title be independent in initial HEP   Status Achieved   PT SHORT TERM GOAL #2   Title sit for 30 minutes without limitation for work   Status Achieved   PT SHORT TERM GOAL #3   Title report a 25% reduction in Rt hip pain with activity   Status Achieved           PT Long Term Goals - 12/27/14 0844    PT LONG TERM GOAL #1   Title be independent in advanced HEP   Time 8   Period Weeks   Status On-going   PT LONG TERM GOAL #2   Title reduce FOTO to < or = to 39% limitation   Time 8   Period Weeks   Status On-going  PT LONG TERM GOAL #3   Title sit for 45 minutes to 1 hour without limitation for work   Time 8   Period Weeks   Status On-going   PT LONG TERM GOAL #4   Title report a 60% reduction in Rt hip pain with activity   Time 8   Period Weeks   Status On-going   PT LONG TERM GOAL #5   Title ascend and descend steps with step-over-step gait at least 75% of the time   PT LONG TERM GOAL #6   Title report a 50% reduction in Rt ankle pain with long periods of walking   Status Achieved               Plan - 12/27/14 0842    Clinical Impression Statement Exercises modified due to c/o Lt knee pain after last session, pt able to tolerate standing hip exercises with red T band without pain. Pt still reports being limited to 30 minutes of sitting at work.  Pt will continue to benefit from stretching, strengthening and manual therapy. Pt declined heat today due to needing to get to work   Pt will benefit from skilled therapeutic intervention in order to improve on the following deficits Decreased range of motion;Pain;Decreased strength;Decreased activity  tolerance;Impaired flexibility   Rehab Potential Good   PT Frequency 2x / week   PT Duration 8 weeks   PT Treatment/Interventions ADLs/Self Care Home Management;Cryotherapy;Electrical Stimulation;Moist Heat;Therapeutic exercise;Therapeutic activities;Iontophoresis 65m/ml Dexamethasone;Functional mobility training;Stair training;Neuromuscular re-education;Patient/family education;Manual techniques;Passive range of motion   PT Next Visit Plan assess standing hip exercises, progress core and hip strength as tolerated   Consulted and Agree with Plan of Care Patient        Problem List There are no active problems to display for this patient.   Sandra Lara PT, DPT  12/27/2014, 8:46 AM PHYSICAL THERAPY DISCHARGE SUMMARY  Visits from Start of Care: 7  Current functional level related to goals / functional outcomes: See above for most current goal status.  Pt has HEP in place for hip and ankle flexibility and strength.     Remaining deficits: Limited to 30 minutes of sitting at work.  Pt has HEP in place and will follow-up with MD as needed.     Education / Equipment: HEP Plan: Patient agrees to discharge.  Patient goals were partially met. Patient is being discharged due to being pleased with the current functional level.  ?????   Sandra Lara PT 01/10/2015 7:27 AM  [facility] [address] [place] [address]: [phone]  Fax:  [phone] Name: Sandra YarbroMRN: [id number] of Birth: [birth year]

## 2015-01-01 ENCOUNTER — Ambulatory Visit: Payer: No Typology Code available for payment source

## 2015-05-24 DIAGNOSIS — K5730 Diverticulosis of large intestine without perforation or abscess without bleeding: Secondary | ICD-10-CM | POA: Insufficient documentation

## 2015-11-22 LAB — HM COLONOSCOPY

## 2015-11-26 DIAGNOSIS — D126 Benign neoplasm of colon, unspecified: Secondary | ICD-10-CM | POA: Insufficient documentation

## 2015-12-13 ENCOUNTER — Other Ambulatory Visit: Payer: Self-pay | Admitting: Family Medicine

## 2015-12-13 DIAGNOSIS — Z1231 Encounter for screening mammogram for malignant neoplasm of breast: Secondary | ICD-10-CM

## 2015-12-30 ENCOUNTER — Encounter (HOSPITAL_COMMUNITY): Payer: Self-pay

## 2015-12-30 ENCOUNTER — Ambulatory Visit
Admission: RE | Admit: 2015-12-30 | Discharge: 2015-12-30 | Disposition: A | Discharge status: Home / Self Care | Payer: No Typology Code available for payment source | Source: Ambulatory Visit | Attending: Family Medicine | Admitting: Family Medicine

## 2015-12-30 DIAGNOSIS — Z1231 Encounter for screening mammogram for malignant neoplasm of breast: Secondary | ICD-10-CM | POA: Diagnosis not present

## 2016-01-03 ENCOUNTER — Encounter (HOSPITAL_COMMUNITY): Payer: Self-pay | Admitting: Emergency Medicine

## 2016-01-03 ENCOUNTER — Emergency Department (HOSPITAL_COMMUNITY)
Admission: EM | Admit: 2016-01-03 | Discharge: 2016-01-03 | Disposition: A | Discharge status: Home / Self Care | Payer: No Typology Code available for payment source | Attending: Emergency Medicine | Admitting: Emergency Medicine

## 2016-01-03 DIAGNOSIS — T7840XA Allergy, unspecified, initial encounter: Secondary | ICD-10-CM | POA: Insufficient documentation

## 2016-01-03 DIAGNOSIS — L03114 Cellulitis of left upper limb: Secondary | ICD-10-CM

## 2016-01-03 DIAGNOSIS — L03012 Cellulitis of left finger: Secondary | ICD-10-CM | POA: Insufficient documentation

## 2016-01-03 MED ORDER — DIPHENHYDRAMINE HCL 25 MG PO CAPS
25.0000 mg | ORAL_CAPSULE | Freq: Once | ORAL | Status: AC
  Administered 2016-01-03: 25 mg via ORAL
  Filled 2016-01-03: qty 1

## 2016-01-03 MED ORDER — DIPHENHYDRAMINE HCL 50 MG/ML IJ SOLN: 25.0000 mg | Freq: Once | INTRAMUSCULAR | Status: DC

## 2016-01-03 MED ORDER — DOXYCYCLINE HYCLATE 100 MG PO CAPS: 100.0000 mg | ORAL_CAPSULE | Freq: Two times a day (BID) | ORAL | 0 refills | Status: DC

## 2016-01-03 MED ORDER — FAMOTIDINE 20 MG PO TABS
20.0000 mg | ORAL_TABLET | Freq: Once | ORAL | Status: AC
  Administered 2016-01-03: 20 mg via ORAL
  Filled 2016-01-03: qty 1

## 2016-01-03 MED ORDER — PREDNISONE 50 MG PO TABS: ORAL_TABLET | ORAL | 0 refills | Status: DC

## 2016-01-03 MED ORDER — SODIUM CHLORIDE 0.9 % IV BOLUS (SEPSIS): 500.0000 mL | Freq: Once | INTRAVENOUS | Status: DC

## 2016-01-03 MED ORDER — FAMOTIDINE IN NACL 20-0.9 MG/50ML-% IV SOLN: 20.0000 mg | Freq: Once | INTRAVENOUS | Status: DC

## 2016-01-03 MED ORDER — METHYLPREDNISOLONE SODIUM SUCC 125 MG IJ SOLR
125.0000 mg | Freq: Once | INTRAMUSCULAR | Status: AC
  Administered 2016-01-03: 125 mg via INTRAMUSCULAR
  Filled 2016-01-03: qty 2

## 2016-01-03 MED ORDER — METHYLPREDNISOLONE SODIUM SUCC 125 MG IJ SOLR: 125.0000 mg | Freq: Once | INTRAMUSCULAR | Status: DC

## 2016-01-03 NOTE — Discharge Instructions (Signed)
Discontinue Bactrim. Start new antibiotic called doxycycline. Also prescription for prednisone. Follow-up your primary care doctor.

## 2016-01-03 NOTE — ED Provider Notes (Signed)
[facility] DEPT Provider Note   CSN: [id number] Arrival date & time: 01/03/16  0840     History   Chief Complaint Chief Complaint  Patient presents with  . Allergic Reaction    HPI Sandra Lara is a 55 y.o. female.  Swollen face and tongue, difficulty swallowing earlier this morning. Patient was recently started on Bactrim for a cellulitis of the left index finger. She is breathing normally. No obvious rash. She is taking nothing at home for the reaction.      Past Medical History:  Diagnosis Date  . Diverticulitis     There are no active problems to display for this patient.   Past Surgical History:  Procedure Laterality Date  . CHOLECYSTECTOMY  2005  . WRIST FRACTURE SURGERY Right     OB History    No data available       Home Medications    Prior to Admission medications   Medication Sig Start Date End Date Taking? Authorizing Provider  doxycycline (VIBRAMYCIN) 100 MG capsule Take 1 capsule (100 mg total) by mouth 2 (two) times daily. 01/03/16   Nat Christen, MD  omega-3 acid ethyl esters (LOVAZA) 1 G capsule Take by mouth 2 (two) times daily.    Historical Provider, MD  omeprazole (PRILOSEC) 10 MG capsule Take 10 mg by mouth daily.    Historical Provider, MD  predniSONE (DELTASONE) 50 MG tablet One tab for 4 days; one half tab for 4 days 01/03/16   Nat Christen, MD  saccharomyces boulardii (FLORASTOR) 250 MG capsule Take 250 mg by mouth 2 (two) times daily.    Historical Provider, MD  Vitamin D, Ergocalciferol, (DRISDOL) 50000 UNITS CAPS capsule Take 50,000 Units by mouth every 7 (seven) days.    Historical Provider, MD    Family History Family History  Problem Relation Age of Onset  . Breast cancer Mother 19    Social History Social History  Substance Use Topics  . Smoking status: Never Smoker  . Smokeless tobacco: Never Used  . Alcohol use Not on file     Allergies   Erythromycin   Review of Systems Review of Systems  All other  systems reviewed and are negative.    Physical Exam Updated Vital Signs BP 140/81   Pulse 68   Ht 4\' 10"  (1.473 m)   Wt 191 lb (86.6 kg)   SpO2 100%   BMI 39.92 kg/m   Physical Exam  Constitutional: She is oriented to person, place, and time. She appears well-developed and well-nourished.  HENT:  Head: Normocephalic and atraumatic.  Normal oropharyngeal area  Eyes: Conjunctivae are normal.  Neck: Neck supple.  Cardiovascular: Normal rate and regular rhythm.   Pulmonary/Chest: Effort normal and breath sounds normal.  Abdominal: Soft. Bowel sounds are normal.  Musculoskeletal: Normal range of motion.  Neurological: She is alert and oriented to person, place, and time.  Skin: Skin is warm and dry.  Left hand: Area of erythema of the left index finger proximal phalanx dorsally  Psychiatric: She has a normal mood and affect. Her behavior is normal.  Nursing note and vitals reviewed.    ED Treatments / Results  Labs (all labs ordered are listed, but only abnormal results are displayed) Labs Reviewed - No data to display  EKG  EKG Interpretation None       Radiology No results found.  Procedures Procedures (including critical care time)  Medications Ordered in ED Medications  famotidine (PEPCID) tablet 20 mg (20 mg  Oral Given 01/03/16 0950)  diphenhydrAMINE (BENADRYL) capsule 25 mg (25 mg Oral Given 01/03/16 0948)  methylPREDNISolone sodium succinate (SOLU-MEDROL) 125 mg/2 mL injection 125 mg (125 mg Intramuscular Given 01/03/16 1012)     Initial Impression / Assessment and Plan / ED Course  I have reviewed the triage vital signs and the nursing notes.  Pertinent labs & imaging results that were available during my care of the patient were reviewed by me and considered in my medical decision making (see chart for details).  Clinical Course    Suspect allergic reaction to Bactrim. Oral Benadryl, oral Pepcid, intramuscular site Medrol given. Patient feels  better. Will discontinue Bactrim and start doxycycline 100 mg bid. Also prescription for prednisone.  Final Clinical Impressions(s) / ED Diagnoses   Final diagnoses:  Allergic reaction, initial encounter  Cellulitis of left hand    New Prescriptions New Prescriptions   DOXYCYCLINE (VIBRAMYCIN) 100 MG CAPSULE    Take 1 capsule (100 mg total) by mouth 2 (two) times daily.   PREDNISONE (DELTASONE) 50 MG TABLET    One tab for 4 days; one half tab for 4 days     Nat Christen, MD 01/03/16 1103

## 2016-01-03 NOTE — ED Triage Notes (Signed)
Pt states she started taking Bactrim last night. Pt reports waking up this morning to swollen face and tongue, feels throat is a little swollen. Pt denies any itching.

## 2016-01-03 NOTE — ED Notes (Signed)
Pt hard IV stick, multiple attempts by nursing staff. IV meds changed to PO route by Dr. Lacinda Axon.

## 2016-03-19 ENCOUNTER — Ambulatory Visit: Payer: Self-pay | Admitting: *Deleted

## 2016-04-20 ENCOUNTER — Ambulatory Visit: Payer: Self-pay | Admitting: *Deleted

## 2016-04-20 VITALS — HR 87 | Temp 99.3°F

## 2016-04-20 DIAGNOSIS — R6889 Other general symptoms and signs: Secondary | ICD-10-CM

## 2016-04-20 NOTE — Progress Notes (Signed)
09:15am: Pt with generalized body aches, mild. Infrequent productive cough. General fatigue. Just not feeling well. Requests temp check. 99.29F. Has not hydrated well this am or last night. Encouraged hydration. Offered ibuprofen for body aches, pt declines at this time. Instructed to RTC if symptoms worsen today.   11:30am: Pt RTC feeling worse. Sts body aches more severe, feels more tired. Chills beginning. Has already spoke with supervisor and made decision to go home for day. Temp slightly increased. Accepted Ibuprofen at this time. Push fluids. Rest. Primary care or [facility] if symptoms worsen at home. ER precautions given. NP appt available when pt returns to work if she feels it is necessary. No further questions/concerns.

## 2016-05-21 ENCOUNTER — Encounter: Payer: Self-pay | Admitting: Registered Nurse

## 2016-05-21 ENCOUNTER — Telehealth: Payer: Self-pay | Admitting: Registered Nurse

## 2016-05-21 DIAGNOSIS — J301 Allergic rhinitis due to pollen: Secondary | ICD-10-CM

## 2016-05-21 MED ORDER — FLUTICASONE PROPIONATE 50 MCG/ACT NA SUSP: 1.0000 | Freq: Two times a day (BID) | NASAL | 0 refills | Status: DC

## 2016-05-21 NOTE — Telephone Encounter (Signed)
Patient requested refill of flonase for seasonal allergic rhinitis.  Dispensed from [facility] for patient from occupational medicine clinic onsite.

## 2016-10-08 ENCOUNTER — Ambulatory Visit: Payer: Self-pay | Admitting: Registered Nurse

## 2016-10-08 VITALS — BP 134/100 | HR 64 | Temp 97.8°F

## 2016-10-08 DIAGNOSIS — J0101 Acute recurrent maxillary sinusitis: Secondary | ICD-10-CM

## 2016-10-08 MED ORDER — SALINE SPRAY 0.65 % NA SOLN: 2.0000 | NASAL | 0 refills | Status: DC

## 2016-10-08 MED ORDER — AMOXICILLIN 875 MG PO TABS: 875.0000 mg | ORAL_TABLET | Freq: Two times a day (BID) | ORAL | 0 refills | Status: DC

## 2016-10-08 NOTE — Progress Notes (Signed)
Subjective:    Patient ID: Sandra Lara, female    DOB: [birth year], 56 y.o.   MRN: [id number]  56y/o caucasian female established patient with sinus headache, congestion, rhinitis, body aches, cough.  Patient has tried flonase and nasal saline for 9 days without any relief of symptoms.  Last sinus infection this spring resolved with amoxicillin.  Doxycycline stomach upset/rash.  Thinks she needs antibiotics.        Review of Systems  Constitutional: Negative for activity change, appetite change, chills, diaphoresis, fatigue, fever and unexpected weight change.  HENT: Positive for congestion, postnasal drip, rhinorrhea, sinus pain, sinus pressure, sneezing and sore throat. Negative for dental problem, drooling, ear discharge, ear pain, facial swelling, hearing loss, mouth sores, nosebleeds, tinnitus, trouble swallowing and voice change.   Eyes: Negative for photophobia, pain, discharge, redness, itching and visual disturbance.  Respiratory: Positive for cough. Negative for choking, chest tightness, shortness of breath, wheezing and stridor.   Cardiovascular: Negative for chest pain, palpitations and leg swelling.  Gastrointestinal: Negative for abdominal distention, abdominal pain, blood in stool, constipation, diarrhea, nausea and vomiting.  Endocrine: Negative for cold intolerance and heat intolerance.  Genitourinary: Negative for difficulty urinating, dysuria and hematuria.  Musculoskeletal: Negative for arthralgias, back pain, gait problem, joint swelling, myalgias, neck pain and neck stiffness.  Skin: Negative for color change, pallor, rash and wound.  Allergic/Immunologic: Positive for environmental allergies. Negative for food allergies.  Neurological: Positive for headaches. Negative for dizziness, tremors, seizures, syncope, facial asymmetry, speech difficulty, weakness, light-headedness and numbness.  Hematological: Negative for adenopathy. Does not bruise/bleed easily.   Psychiatric/Behavioral: Negative for agitation, behavioral problems, confusion and sleep disturbance.       Objective:   Physical Exam  Constitutional: She is oriented to person, place, and time. She appears well-developed and well-nourished. She is active and cooperative.  Non-toxic appearance. She does not have a sickly appearance. She appears ill. No distress.  HENT:  Head: Normocephalic and atraumatic.  Right Ear: Hearing, external ear and ear canal normal. A middle ear effusion is present.  Left Ear: Hearing, external ear and ear canal normal. A middle ear effusion is present.  Nose: Mucosal edema and rhinorrhea present. No nose lacerations, sinus tenderness, nasal deformity, septal deviation or nasal septal hematoma. No epistaxis.  No foreign bodies. Right sinus exhibits maxillary sinus tenderness. Right sinus exhibits no frontal sinus tenderness. Left sinus exhibits maxillary sinus tenderness. Left sinus exhibits no frontal sinus tenderness.  Mouth/Throat: Uvula is midline and mucous membranes are normal. Mucous membranes are not pale, not dry and not cyanotic. She does not have dentures. No oral lesions. No trismus in the jaw. Normal dentition. No dental abscesses, uvula swelling, lacerations or dental caries. Posterior oropharyngeal edema and posterior oropharyngeal erythema present. No oropharyngeal exudate or tonsillar abscesses.  Cobblestoning posterior pharynx; bilateral TMs air fluid level clear; bilateral allergic shiners; bilateral nasal turbinates edema/erythema clear discharge;   Eyes: Pupils are equal, round, and reactive to light. Conjunctivae, EOM and lids are normal. Right eye exhibits no chemosis, no discharge, no exudate and no hordeolum. No foreign body present in the right eye. Left eye exhibits no chemosis, no discharge, no exudate and no hordeolum. No foreign body present in the left eye. Right conjunctiva is not injected. Right conjunctiva has no hemorrhage. Left  conjunctiva is not injected. Left conjunctiva has no hemorrhage. No scleral icterus. Right eye exhibits normal extraocular motion and no nystagmus. Left eye exhibits normal extraocular motion and no nystagmus. Right pupil  is round and reactive. Left pupil is round and reactive. Pupils are equal.  Neck: Trachea normal, normal range of motion and phonation normal. Neck supple. No tracheal tenderness, no spinous process tenderness and no muscular tenderness present. No neck rigidity. No tracheal deviation, no edema, no erythema and normal range of motion present. No thyroid mass and no thyromegaly present.  Cardiovascular: Normal rate, regular rhythm, S1 normal, S2 normal, normal heart sounds and intact distal pulses.  PMI is not displaced.  Exam reveals no gallop and no friction rub.   No murmur heard. Pulmonary/Chest: Effort normal and breath sounds normal. No accessory muscle usage or stridor. No respiratory distress. She has no decreased breath sounds. She has no wheezes. She has no rhonchi. She has no rales. She exhibits no tenderness.  Rare nonproductive cough in exam room; speaks full sentences without difficulty  Abdominal: Soft. Normal appearance. She exhibits no distension, no fluid wave and no ascites. There is no rigidity and no guarding.  Musculoskeletal: Normal range of motion. She exhibits no edema or tenderness.       Right shoulder: Normal.       Left shoulder: Normal.       Right hip: Normal.       Left hip: Normal.       Right knee: Normal.       Left knee: Normal.       Cervical back: Normal.       Right hand: Normal.       Left hand: Normal.  Lymphadenopathy:       Head (right side): No submental, no submandibular, no tonsillar, no preauricular, no posterior auricular and no occipital adenopathy present.       Head (left side): No submental, no submandibular, no tonsillar, no preauricular, no posterior auricular and no occipital adenopathy present.    She has no cervical  adenopathy.       Right cervical: No superficial cervical, no deep cervical and no posterior cervical adenopathy present.      Left cervical: No superficial cervical, no deep cervical and no posterior cervical adenopathy present.  Neurological: She is alert and oriented to person, place, and time. She has normal strength. She is not disoriented. She displays no atrophy and no tremor. No cranial nerve deficit or sensory deficit. She exhibits normal muscle tone. She displays no seizure activity. Coordination and gait normal. GCS eye subscore is 4. GCS verbal subscore is 5. GCS motor subscore is 6.  On/off exam table; in/out of chair without difficulty; gait sure and steady in hall  Skin: Skin is warm, dry and intact. No abrasion, no bruising, no burn, no ecchymosis, no laceration, no lesion, no petechiae and no rash noted. She is not diaphoretic. No cyanosis or erythema. No pallor. Nails show no clubbing.  Psychiatric: She has a normal mood and affect. Her speech is normal and behavior is normal. Judgment and thought content normal. Cognition and memory are normal.  Nursing note and vitals reviewed.         Assessment & Plan:  A-sinusitis maxillary acute recurrent  P- amoxicillin 875mg  po BID x 10 days #20 RF0 dispensed from [facility].  Nasal saline 2 sprays each nostril q2hwa dispensed from clinic stock.  Cough lozenges 1 po q2h prn cough 8 UD dispensed from clinic stock.  No evidence of systemic bacterial infection, non toxic and well hydrated.  I do not see where any further testing or imaging is necessary at this time.   I  will suggest supportive care, rest, good hygiene and encourage the patient to take adequate fluids.  The patient is to return to clinic or EMERGENCY ROOM if symptoms worsen or change significantly.  Exitcare handout on sinusitis and sinus rinse given to patient.  Patient verbalized agreement and understanding of treatment plan and had no further questions at this time.   P2:  Hand  washing and cover cough   flonase 1 spray each nostril BID at home.  Patient may use normal saline nasal spray as needed.  Avoid triggers if possible.  Shower prior to bedtime if exposed to triggers.  If allergic dust/dust mites recommend mattress/pillow covers/encasements; washing linens, vacuuming, sweeping, dusting weekly.  Call or return to clinic as needed if these symptoms worsen or fail to improve as anticipated.   Exitcare handout on allergic rhinitis given to patient.  Patient verbalized understanding of instructions, agreed with plan of care and had no further questions at this time.  P2:  Avoidance and hand washing.    No evidence of invasive bacterial infection, non toxic and well hydrated.  This is most likely self limiting viral infection.  I do not see where any further testing or imaging is necessary at this time.   I will suggest supportive care, rest, good hygiene and encourage the patient to take adequate fluids.  The patient is to return to clinic or EMERGENCY ROOM if symptoms worsen or change significantly e.g. ear pain, fever, purulent discharge from ears or bleeding.  Exitcare handout on otitis media with effusion given to patient.  Patient verbalized agreement and understanding of treatment plan.

## 2016-10-08 NOTE — Patient Instructions (Signed)
Sinus Rinse What is a sinus rinse? A sinus rinse is a simple home treatment that is used to rinse your sinuses with a sterile mixture of salt and water (saline solution). Sinuses are air-filled spaces in your skull behind the bones of your face and forehead that open into your nasal cavity. You will use the following:  Saline solution.  Neti pot or spray bottle. This releases the saline solution into your nose and through your sinuses. Neti pots and spray bottles can be purchased at your local pharmacy, a health food store, or online.  When would I do a sinus rinse? A sinus rinse can help to clear mucus, dirt, dust, or pollen from the nasal cavity. You may do a sinus rinse when you have a cold, a virus, nasal allergy symptoms, a sinus infection, or stuffiness in the nose or sinuses. If you are considering a sinus rinse:  Ask your child's health care provider before performing a sinus rinse on your child.  Do not do a sinus rinse if you have had ear or nasal surgery, ear infection, or blocked ears.  How do I do a sinus rinse?  Wash your hands.  Disinfect your device according to the directions provided and then dry it.  Use the solution that comes with your device or one that is sold separately in stores. Follow the mixing directions on the package.  Fill your device with the amount of saline solution as directed by the device instructions.  Stand over a sink and tilt your head sideways over the sink.  Place the spout of the device in your upper nostril (the one closer to the ceiling).  Gently pour or squeeze the saline solution into the nasal cavity. The liquid should drain to the lower nostril if you are not overly congested.  Gently blow your nose. Blowing too hard may cause ear pain.  Repeat in the other nostril.  Clean and rinse your device with clean water and then air-dry it. Are there risks of a sinus rinse? Sinus rinse is generally very safe and effective. However,  there are a few risks, which include:  A burning sensation in the sinuses. This may happen if you do not make the saline solution as directed. Make sure to follow all directions when making the saline solution.  Infection from contaminated water. This is rare, but possible.  Nasal irritation.  This information is not intended to replace advice given to you by your health care provider. Make sure you discuss any questions you have with your health care provider. Document Released: 09/20/2013 Document Revised: 01/21/2016 Document Reviewed: 07/11/2013 Elsevier Interactive Patient Education  2017 [employer] Sinusitis, Adult Sinusitis is soreness and inflammation of your sinuses. Sinuses are hollow spaces in the bones around your face. Your sinuses are located:  Around your eyes.  In the middle of your forehead.  Behind your nose.  In your cheekbones.  Your sinuses and nasal passages are lined with a stringy fluid (mucus). Mucus normally drains out of your sinuses. When your nasal tissues become inflamed or swollen, the mucus can become trapped or blocked so air cannot flow through your sinuses. This allows bacteria, viruses, and funguses to grow, which leads to infection. Sinusitis can develop quickly and last for 7?10 days (acute) or for more than 12 weeks (chronic). Sinusitis often develops after a cold. What are the causes? This condition is caused by anything that creates swelling in the sinuses or stops mucus from draining, including:  Allergies.    Asthma.  Bacterial or viral infection.  Abnormally shaped bones between the nasal passages.  Nasal growths that contain mucus (nasal polyps).  Narrow sinus openings.  Pollutants, such as chemicals or irritants in the air.  A foreign object stuck in the nose.  A fungal infection. This is rare.  What increases the risk? The following factors may make you more likely to develop this condition:  Having allergies or  asthma.  Having had a recent cold or respiratory tract infection.  Having structural deformities or blockages in your nose or sinuses.  Having a weak immune system.  Doing a lot of swimming or diving.  Overusing nasal sprays.  Smoking.  What are the signs or symptoms? The main symptoms of this condition are pain and a feeling of pressure around the affected sinuses. Other symptoms include:  Upper toothache.  Earache.  Headache.  Bad breath.  Decreased sense of smell and taste.  A cough that may get worse at night.  Fatigue.  Fever.  Thick drainage from your nose. The drainage is often green and it may contain pus (purulent).  Stuffy nose or congestion.  Postnasal drip. This is when extra mucus collects in the throat or back of the nose.  Swelling and warmth over the affected sinuses.  Sore throat.  Sensitivity to light.  How is this diagnosed? This condition is diagnosed based on symptoms, a medical history, and a physical exam. To find out if your condition is acute or chronic, your health care provider may:  Look in your nose for signs of nasal polyps.  Tap over the affected sinus to check for signs of infection.  View the inside of your sinuses using an imaging device that has a light attached (endoscope).  If your health care provider suspects that you have chronic sinusitis, you may also:  Be tested for allergies.  Have a sample of mucus taken from your nose (nasal culture) and checked for bacteria.  Have a mucus sample examined to see if your sinusitis is related to an allergy.  If your sinusitis does not respond to treatment and it lasts longer than 8 weeks, you may have an MRI or CT scan to check your sinuses. These scans also help to determine how severe your infection is. In rare cases, a bone biopsy may be done to rule out more serious types of fungal sinus disease. How is this treated? Treatment for sinusitis depends on the cause and  whether your condition is chronic or acute. If a virus is causing your sinusitis, your symptoms will go away on their own within 10 days. You may be given medicines to relieve your symptoms, including:  Topical nasal decongestants. They shrink swollen nasal passages and let mucus drain from your sinuses.  Antihistamines. These drugs block inflammation that is triggered by allergies. This can help to ease swelling in your nose and sinuses.  Topical nasal corticosteroids. These are nasal sprays that ease inflammation and swelling in your nose and sinuses.  Nasal saline washes. These rinses can help to get rid of thick mucus in your nose.  If your condition is caused by bacteria, you will be given an antibiotic medicine. If your condition is caused by a fungus, you will be given an antifungal medicine. Surgery may be needed to correct underlying conditions, such as narrow nasal passages. Surgery may also be needed to remove polyps. Follow these instructions at home: Medicines  Take, use, or apply over-the-counter and prescription medicines only as told by   your health care provider. These may include nasal sprays.  If you were prescribed an antibiotic medicine, take it as told by your health care provider. Do not stop taking the antibiotic even if you start to feel better. Hydrate and Humidify  Drink enough water to keep your urine clear or pale yellow. Staying hydrated will help to thin your mucus.  Use a cool mist humidifier to keep the humidity level in your home above 50%.  Inhale steam for 10-15 minutes, 3-4 times a day or as told by your health care provider. You can do this in the bathroom while a hot shower is running.  Limit your exposure to cool or dry air. Rest  Rest as much as possible.  Sleep with your head raised (elevated).  Make sure to get enough sleep each night. General instructions  Apply a warm, moist washcloth to your face 3-4 times a day or as told by your  health care provider. This will help with discomfort.  Wash your hands often with soap and water to reduce your exposure to viruses and other germs. If soap and water are not available, use hand sanitizer.  Do not smoke. Avoid being around people who are smoking (secondhand smoke).  Keep all follow-up visits as told by your health care provider. This is important. Contact a health care provider if:  You have a fever.  Your symptoms get worse.  Your symptoms do not improve within 10 days. Get help right away if:  You have a severe headache.  You have persistent vomiting.  You have pain or swelling around your face or eyes.  You have vision problems.  You develop confusion.  Your neck is stiff.  You have trouble breathing. This information is not intended to replace advice given to you by your health care provider. Make sure you discuss any questions you have with your health care provider. Document Released: 02/23/2005 Document Revised: 10/20/2015 Document Reviewed: 12/19/2014 Elsevier Interactive Patient Education  2017 [employer]  

## 2016-10-29 DIAGNOSIS — Z6841 Body Mass Index (BMI) 40.0 and over, adult: Secondary | ICD-10-CM

## 2017-01-12 ENCOUNTER — Encounter: Payer: Self-pay | Admitting: Registered Nurse

## 2017-01-12 ENCOUNTER — Ambulatory Visit: Payer: Self-pay | Admitting: Registered Nurse

## 2017-01-12 VITALS — BP 117/94 | HR 83 | Temp 98.4°F

## 2017-01-12 DIAGNOSIS — J209 Acute bronchitis, unspecified: Secondary | ICD-10-CM

## 2017-01-12 DIAGNOSIS — J0101 Acute recurrent maxillary sinusitis: Secondary | ICD-10-CM

## 2017-01-12 MED ORDER — AMOXICILLIN 875 MG PO TABS: 875.0000 mg | ORAL_TABLET | Freq: Two times a day (BID) | ORAL | 0 refills | Status: AC

## 2017-01-12 NOTE — Progress Notes (Signed)
Subjective:    Patient ID: Sandra Lara, female    DOB: 04-20-1960, 56 y.o.   MRN: [id number]  56y/o caucasian female established Pt reports productive sure cough and frontal and maxillary sinus pain/pressure. Using Sudafed, Flonase and Ibuprofen at home.  Was on vacation last week of October forgot her flonase at home so bought rhinocort OTC and used until bottle ran out then restarted flonase 2 sprays once a day.  Upper teeth intermittent pain, and stabbing frontal headache intermittent.  Cough barky hasn't required her to use inhaler albuterol sometimes wheezing.  Hasn't restarted but has tessalon pearles at home.      Review of Systems  Constitutional: Negative for activity change, appetite change, chills, diaphoresis, fatigue, fever and unexpected weight change.  HENT: Positive for congestion, sinus pressure, sinus pain and sore throat. Negative for dental problem, drooling, ear discharge, ear pain, facial swelling, hearing loss, mouth sores, nosebleeds, postnasal drip, rhinorrhea, sneezing, tinnitus, trouble swallowing and voice change.   Eyes: Negative for photophobia, pain, discharge, redness, itching and visual disturbance.  Respiratory: Positive for cough and wheezing. Negative for choking, chest tightness, shortness of breath and stridor.   Cardiovascular: Negative for chest pain, palpitations and leg swelling.  Gastrointestinal: Negative for abdominal distention, abdominal pain, blood in stool, constipation, diarrhea, nausea and vomiting.  Endocrine: Negative for cold intolerance and heat intolerance.  Genitourinary: Negative for difficulty urinating, dysuria and hematuria.  Musculoskeletal: Negative for arthralgias, back pain, gait problem, joint swelling, myalgias, neck pain and neck stiffness.  Skin: Negative for color change, pallor, rash and wound.  Allergic/Immunologic: Positive for environmental allergies. Negative for food allergies.  Neurological: Positive for  headaches. Negative for dizziness, tremors, seizures, syncope, facial asymmetry, speech difficulty, weakness, light-headedness and numbness.  Hematological: Negative for adenopathy. Does not bruise/bleed easily.  Psychiatric/Behavioral: Negative for agitation, behavioral problems, confusion and sleep disturbance.       Objective:   Physical Exam  Constitutional: She is oriented to person, place, and time. She appears well-developed and well-nourished. She is active and cooperative.  Non-toxic appearance. She does not have a sickly appearance. She appears ill. No distress.  HENT:  Head: Normocephalic and atraumatic.  Right Ear: Hearing, external ear and ear canal normal. A middle ear effusion is present.  Left Ear: Hearing, external ear and ear canal normal. A middle ear effusion is present.  Nose: Mucosal edema and rhinorrhea present. No nose lacerations, sinus tenderness, nasal deformity, septal deviation or nasal septal hematoma. No epistaxis.  No foreign bodies. Right sinus exhibits maxillary sinus tenderness and frontal sinus tenderness. Left sinus exhibits maxillary sinus tenderness and frontal sinus tenderness.  Mouth/Throat: Uvula is midline and mucous membranes are normal. Mucous membranes are not pale, not dry and not cyanotic. She does not have dentures. No oral lesions. No trismus in the jaw. Normal dentition. No dental abscesses, uvula swelling, lacerations or dental caries. Posterior oropharyngeal edema and posterior oropharyngeal erythema present. No oropharyngeal exudate or tonsillar abscesses.  Cobblestoning posterior pharynx; bilateral TMs air fluid level clear; bilateral nasal turbinates with edema/erythema/clear discharge; bilateral allergic shiners; maxillary greater than frontal sinuses TTP  Eyes: Conjunctivae, EOM and lids are normal. Pupils are equal, round, and reactive to light. Right eye exhibits no chemosis, no discharge, no exudate and no hordeolum. No foreign body present  in the right eye. Left eye exhibits no chemosis, no discharge, no exudate and no hordeolum. No foreign body present in the left eye. Right conjunctiva is not injected. Right conjunctiva has  no hemorrhage. Left conjunctiva is not injected. Left conjunctiva has no hemorrhage. No scleral icterus. Right eye exhibits normal extraocular motion and no nystagmus. Left eye exhibits normal extraocular motion and no nystagmus. Right pupil is round and reactive. Left pupil is round and reactive. Pupils are equal.  Neck: Trachea normal, normal range of motion and phonation normal. Neck supple. No tracheal tenderness and no muscular tenderness present. No neck rigidity. No tracheal deviation, no edema, no erythema and normal range of motion present. No thyroid mass and no thyromegaly present.  Cardiovascular: Normal rate, regular rhythm, S1 normal, S2 normal, normal heart sounds and intact distal pulses. PMI is not displaced. Exam reveals no gallop and no friction rub.  No murmur heard. Pulses:      Radial pulses are 2+ on the right side, and 2+ on the left side.  Pulmonary/Chest: Effort normal and breath sounds normal. No accessory muscle usage or stridor. No respiratory distress. She has no decreased breath sounds. She has no wheezes. She has no rhonchi. She has no rales. She exhibits no tenderness.  Spoke full sentences without difficulty; intermittent nonproductive cough in exam room  Abdominal: Soft. Normal appearance. She exhibits no distension, no fluid wave and no ascites. There is no rigidity and no guarding.  Musculoskeletal: Normal range of motion. She exhibits no edema or tenderness.       Right shoulder: Normal.       Left shoulder: Normal.       Right elbow: Normal.      Left elbow: Normal.       Right hip: Normal.       Left hip: Normal.       Right knee: Normal.       Left knee: Normal.       Cervical back: Normal.       Thoracic back: Normal.       Lumbar back: Normal.       Right hand:  Normal.       Left hand: Normal.  Lymphadenopathy:       Head (right side): No submental, no submandibular, no tonsillar, no preauricular, no posterior auricular and no occipital adenopathy present.       Head (left side): No submental, no submandibular, no tonsillar, no preauricular, no posterior auricular and no occipital adenopathy present.    She has no cervical adenopathy.       Right cervical: No superficial cervical, no deep cervical and no posterior cervical adenopathy present.      Left cervical: No superficial cervical, no deep cervical and no posterior cervical adenopathy present.  Neurological: She is alert and oriented to person, place, and time. She has normal strength. She is not disoriented. She displays no atrophy and no tremor. No cranial nerve deficit or sensory deficit. She exhibits normal muscle tone. She displays no seizure activity. Coordination and gait normal. GCS eye subscore is 4. GCS verbal subscore is 5. GCS motor subscore is 6.  On/off exam table/in/out of chair without difficulty ;gait sure and steady in hallway  Skin: Skin is warm, dry and intact. No abrasion, no bruising, no burn, no ecchymosis, no laceration, no lesion, no petechiae and no rash noted. She is not diaphoretic. No cyanosis or erythema. No pallor. Nails show no clubbing.  Psychiatric: She has a normal mood and affect. Her speech is normal and behavior is normal. Judgment and thought content normal. Cognition and memory are normal.  Nursing note and vitals reviewed.  Assessment & Plan:  A-acute recurrent maxillary sinusitis; acute bronchitis; elevated blood pressure  P- Change flonase 1 spray each nostril BID at home, saline 2 sprays each nostril q2h wa prn congestion given 1 bottle from clinic stock.  If no improvement with 48 hours of saline and flonase use start amoxicillin 875mg  po BID x 10 days #20 RF0 dispensed from [facility] to patient.  Patient refused augmentin and allergy  doxycycline/bactrim ds and erythromycin.   Denied personal or family history of ENT cancer.  Shower BID especially prior to bed. No evidence of systemic bacterial infection, non toxic and well hydrated.  I do not see where any further testing or imaging is necessary at this time.   I will suggest supportive care, rest, good hygiene and encourage the patient to take adequate fluids.  The patient is to return to clinic or EMERGENCY ROOM if symptoms worsen or change significantly.  Exitcare handout on sinusitis and sinus rinse given to patient.  Patient verbalized agreement and understanding of treatment plan and had no further questions at this time.   P2:  Hand washing and cover cough   Has tessalon pearles at home restart 1 gel cap po TID prn cough.  Has albuterol MDI at home 1-2 puffs po q4-6h prn protracted cough/wheezing.  Cough lozenges po q2h prn cough given 8 UD from clinic stock. Consider course of prednisone oral if no improvement or worsening within next week.  Bronchitis simple, community acquired, may have started as viral (probably respiratory syncytial, parainfluenza, influenza, or adenovirus), but now evidence of acute purulent bronchitis with resultant bronchial edema and mucus formation.  Viruses are the most common cause of bronchial inflammation in otherwise healthy adults with acute bronchitis.  The appearance of sputum is not predictive of whether a bacterial infection is present.  Purulent sputum is most often caused by viral infections.  There are a small portion of those caused by non-viral agents being Mycoplama pneumonia.  Microscopic examination or C&S of sputum in the healthy adult with acute bronchitis is generally not helpful (usually negative or normal respiratory flora) other considerations being cough from upper respiratory tract infections, sinusitis or allergic syndromes (mild asthma or viral pneumonia).  Differential Diagnoses:  reactive airway disease (asthma, allergic  aspergillosis (eosinophilia), chronic bronchitis, respiratory infection (sinusitis, common cold, pneumonia), congestive heart failure, reflux esophagitis, bronchogenic tumor, aspiration syndromes and/or exposure to pulmonary irritants/smoke.  Without high fever, severe dyspnea, lack of physical findings or other risk factors, I will hold on a chest radiograph and CBC at this time.  I discussed that approximately 50% of patients with acute bronchitis have a cough that lasts up to three weeks, and 25% for over a month.  Tylenol 500mg  one to two tablets every four to six hours as needed for fever or myalgias.  No aspirin. Exitcare handout on bronchitis and inhaler use given to patient.  ER if hemopthysis, SOB, worst chest pain of life.   Patient instructed to follow up in one week or sooner if symptoms worsen.  Patient verbalized agreement and understanding of treatment plan.  P2:  hand washing and cover cough  Acute illness, elevated blood pressure and BMI.  Under care with PCM seen within last 3 months.  Encouraged weight loss.  Patient to routine follow up with PCM sooner if recurrent headaches when not ill, acute visual changes, chest pain.  Patient verbalized understanding information/instructions, agreed with plan of care and had no further questions at this time.

## 2017-01-12 NOTE — Patient Instructions (Signed)
Sinus Rinse What is a sinus rinse? A sinus rinse is a simple home treatment that is used to rinse your sinuses with a sterile mixture of salt and water (saline solution). Sinuses are air-filled spaces in your skull behind the bones of your face and forehead that open into your nasal cavity. You will use the following:  Saline solution.  Neti pot or spray bottle. This releases the saline solution into your nose and through your sinuses. Neti pots and spray bottles can be purchased at [employer], a health food store, or online.  When would I do a sinus rinse? A sinus rinse can help to clear mucus, dirt, dust, or pollen from the nasal cavity. You may do a sinus rinse when you have a cold, a virus, nasal allergy symptoms, a sinus infection, or stuffiness in the nose or sinuses. If you are considering a sinus rinse:  Ask your child's health care provider before performing a sinus rinse on your child.  Do not do a sinus rinse if you have had ear or nasal surgery, ear infection, or blocked ears.  How do I do a sinus rinse?  Wash your hands.  Disinfect your device according to the directions provided and then dry it.  Use the solution that comes with your device or one that is sold separately in stores. Follow the mixing directions on the package.  Fill your device with the amount of saline solution as directed by the device instructions.  Stand over a sink and tilt your head sideways over the sink.  Place the spout of the device in your upper nostril (the one closer to the ceiling).  Gently pour or squeeze the saline solution into the nasal cavity. The liquid should drain to the lower nostril if you are not overly congested.  Gently blow your nose. Blowing too hard may cause ear pain.  Repeat in the other nostril.  Clean and rinse your device with clean water and then air-dry it. Are there risks of a sinus rinse? Sinus rinse is generally very safe and effective. However,  there are a few risks, which include:  A burning sensation in the sinuses. This may happen if you do not make the saline solution as directed. Make sure to follow all directions when making the saline solution.  Infection from contaminated water. This is rare, but possible.  Nasal irritation.  This information is not intended to replace advice given to you by your health care provider. Make sure you discuss any questions you have with your health care provider. Document Released: 09/20/2013 Document Revised: 01/21/2016 Document Reviewed: 07/11/2013 Elsevier Interactive Patient Education  2017 [employer] Sinusitis, Adult Sinusitis is soreness and inflammation of your sinuses. Sinuses are hollow spaces in the bones around your face. They are located:  Around your eyes.  In the middle of your forehead.  Behind your nose.  In your cheekbones.  Your sinuses and nasal passages are lined with a stringy fluid (mucus). Mucus normally drains out of your sinuses. When your nasal tissues get inflamed or swollen, the mucus can get trapped or blocked so air cannot flow through your sinuses. This lets bacteria, viruses, and funguses grow, and that leads to infection. Follow these instructions at home: Medicines  Take, use, or apply over-the-counter and prescription medicines only as told by your doctor. These may include nasal sprays.  If you were prescribed an antibiotic medicine, take it as told by your doctor. Do not stop taking the antibiotic even  if you start to feel better. Hydrate and Humidify  Drink enough water to keep your pee (urine) clear or pale yellow.  Use a cool mist humidifier to keep the humidity level in your home above 50%.  Breathe in steam for 10-15 minutes, 3-4 times a day or as told by your doctor. You can do this in the bathroom while a hot shower is running.  Try not to spend time in cool or dry air. Rest  Rest as much as possible.  Sleep with your head raised  (elevated).  Make sure to get enough sleep each night. General instructions  Put a warm, moist washcloth on your face 3-4 times a day or as told by your doctor. This will help with discomfort.  Wash your hands often with soap and water. If there is no soap and water, use hand sanitizer.  Do not smoke. Avoid being around people who are smoking (secondhand smoke).  Keep all follow-up visits as told by your doctor. This is important. Contact a doctor if:  You have a fever.  Your symptoms get worse.  Your symptoms do not get better within 10 days. Get help right away if:  You have a very bad headache.  You cannot stop throwing up (vomiting).  You have pain or swelling around your face or eyes.  You have trouble seeing.  You feel confused.  Your neck is stiff.  You have trouble breathing. This information is not intended to replace advice given to you by your health care provider. Make sure you discuss any questions you have with your health care provider. Document Released: 08/12/2007 Document Revised: 10/20/2015 Document Reviewed: 12/19/2014 Elsevier Interactive Patient Education  [employer].

## 2017-03-09 LAB — HM PAP SMEAR

## 2017-03-16 ENCOUNTER — Ambulatory Visit: Payer: Self-pay | Admitting: Registered Nurse

## 2017-03-16 VITALS — BP 120/93 | HR 68 | Temp 98.2°F

## 2017-03-16 DIAGNOSIS — J209 Acute bronchitis, unspecified: Secondary | ICD-10-CM

## 2017-03-16 DIAGNOSIS — J0101 Acute recurrent maxillary sinusitis: Secondary | ICD-10-CM

## 2017-03-16 DIAGNOSIS — L2082 Flexural eczema: Secondary | ICD-10-CM

## 2017-03-16 MED ORDER — PREDNISONE 10 MG PO TABS: 20.0000 mg | ORAL_TABLET | Freq: Every day | ORAL | 0 refills | Status: AC

## 2017-03-16 MED ORDER — AMOXICILLIN 875 MG PO TABS
875.0000 mg | ORAL_TABLET | Freq: Two times a day (BID) | ORAL | 0 refills | Status: DC
Start: 2017-03-16 — End: 2017-04-22

## 2017-03-16 MED ORDER — TRIAMCINOLONE ACETONIDE 0.1 % EX CREA: 1.0000 "application " | TOPICAL_CREAM | Freq: Two times a day (BID) | CUTANEOUS | 0 refills | Status: AC

## 2017-03-16 MED ORDER — SALINE SPRAY 0.65 % NA SOLN: 2.0000 | NASAL | 0 refills | Status: DC

## 2017-03-16 NOTE — Progress Notes (Signed)
Subjective:    Patient ID: Sandra Lara, female    DOB: [birth year], 57 y.o.   MRN: [id number]  56y/o caucasian female established Pt c/o sinus pain and pressure, productive cough, sneezing off and on 3 months, this episode for past 2.5 weeks. Also points to eustachian tube area as painful. Ear pain, denies drainage. Taking Coricidan Max Strength Flu HBP, Ibuprofen, Afrin every night for couple weeks.  Last amoxicillin Sep 2018.  Still has inhaler at home but not using.  Still has flonase, tessalon pearles at home needs refill on nasal saline.  Sick contacts in past month at work.  Would like refill of her triamcinolone cream ran out that she applies to elbows when eczema flares; gold bond exzema application daily not helping and skin cracking.      Review of Systems  Constitutional: Positive for fatigue. Negative for activity change, appetite change, chills, diaphoresis, fever and unexpected weight change.  HENT: Positive for congestion, ear pain, postnasal drip, rhinorrhea, sinus pressure, sinus pain and sneezing. Negative for dental problem, drooling, ear discharge, facial swelling, hearing loss, mouth sores, nosebleeds, sore throat, tinnitus, trouble swallowing and voice change.   Eyes: Negative for photophobia, pain, discharge, redness, itching and visual disturbance.  Respiratory: Positive for cough. Negative for choking, chest tightness, shortness of breath, wheezing and stridor.   Cardiovascular: Negative for chest pain, palpitations and leg swelling.  Gastrointestinal: Negative for abdominal distention, abdominal pain, blood in stool, constipation, diarrhea, nausea and vomiting.  Endocrine: Negative for cold intolerance and heat intolerance.  Genitourinary: Negative for difficulty urinating, dysuria and hematuria.  Musculoskeletal: Negative for arthralgias, back pain, gait problem, joint swelling, myalgias, neck pain and neck stiffness.  Skin: Negative for color change, pallor,  rash and wound.  Allergic/Immunologic: Positive for environmental allergies. Negative for food allergies.  Neurological: Positive for headaches. Negative for dizziness, tremors, seizures, syncope, facial asymmetry, speech difficulty, weakness, light-headedness and numbness.  Hematological: Negative for adenopathy. Does not bruise/bleed easily.  Psychiatric/Behavioral: Positive for sleep disturbance. Negative for agitation, behavioral problems and confusion.       Objective:   Physical Exam  Constitutional: She is oriented to person, place, and time. She appears well-developed and well-nourished. She is active and cooperative.  Non-toxic appearance. She does not have a sickly appearance. She appears ill. No distress.  HENT:  Head: Normocephalic and atraumatic.  Right Ear: Hearing, external ear and ear canal normal. A middle ear effusion is present.  Left Ear: Hearing, external ear and ear canal normal. A middle ear effusion is present.  Nose: Mucosal edema and rhinorrhea present. No nose lacerations, sinus tenderness, nasal deformity, septal deviation or nasal septal hematoma. No epistaxis.  No foreign bodies. Right sinus exhibits maxillary sinus tenderness. Right sinus exhibits no frontal sinus tenderness. Left sinus exhibits maxillary sinus tenderness. Left sinus exhibits no frontal sinus tenderness.  Mouth/Throat: Uvula is midline and mucous membranes are normal. Mucous membranes are not pale, not dry and not cyanotic. She does not have dentures. No oral lesions. No trismus in the jaw. Normal dentition. No dental abscesses, uvula swelling, lacerations or dental caries. Posterior oropharyngeal edema and posterior oropharyngeal erythema present. No oropharyngeal exudate or tonsillar abscesses.  Cobblestoning posterior pharynx; bilateral TMs air fluid level clear; bilateral nasal turbinates edema/erythema/clear discharge; bilateral allergic shiners; nasally voice  Eyes: Conjunctivae, EOM and lids  are normal. Pupils are equal, round, and reactive to light. Right eye exhibits no chemosis, no discharge, no exudate and no hordeolum. No foreign body present  in the right eye. Left eye exhibits no chemosis, no discharge, no exudate and no hordeolum. No foreign body present in the left eye. Right conjunctiva is not injected. Right conjunctiva has no hemorrhage. Left conjunctiva is not injected. Left conjunctiva has no hemorrhage. No scleral icterus. Right eye exhibits normal extraocular motion and no nystagmus. Left eye exhibits normal extraocular motion and no nystagmus. Right pupil is round and reactive. Left pupil is round and reactive. Pupils are equal.  Neck: Trachea normal, normal range of motion and phonation normal. Neck supple. No tracheal tenderness and no muscular tenderness present. No neck rigidity. No tracheal deviation, no edema, no erythema and normal range of motion present. No thyroid mass and no thyromegaly present.  Cardiovascular: Normal rate, regular rhythm, S1 normal, S2 normal, normal heart sounds and intact distal pulses. PMI is not displaced. Exam reveals no gallop and no friction rub.  No murmur heard. Pulmonary/Chest: Effort normal and breath sounds normal. No accessory muscle usage or stridor. No respiratory distress. She has no decreased breath sounds. She has no wheezes. She has no rhonchi. She has no rales. She exhibits no tenderness.  Frequent nonproductive cough in exam room; spoke full sentences without difficulty  Abdominal: Soft. Normal appearance. She exhibits no distension, no fluid wave and no ascites. There is no rigidity and no guarding.  Musculoskeletal: Normal range of motion. She exhibits no edema or tenderness.       Right shoulder: Normal.       Left shoulder: Normal.       Right elbow: Normal.      Left elbow: Normal.       Right hip: Normal.       Left hip: Normal.       Right knee: Normal.       Left knee: Normal.       Cervical back: Normal.        Thoracic back: Normal.       Lumbar back: Normal.       Right hand: Normal.       Left hand: Normal.  Lymphadenopathy:       Head (right side): No submental, no submandibular, no tonsillar, no preauricular, no posterior auricular and no occipital adenopathy present.       Head (left side): No submental, no submandibular, no tonsillar, no preauricular, no posterior auricular and no occipital adenopathy present.    She has no cervical adenopathy.       Right cervical: No superficial cervical, no deep cervical and no posterior cervical adenopathy present.      Left cervical: No superficial cervical, no deep cervical and no posterior cervical adenopathy present.  Neurological: She is alert and oriented to person, place, and time. She has normal strength. She is not disoriented. She displays no atrophy and no tremor. No cranial nerve deficit or sensory deficit. She exhibits normal muscle tone. She displays no seizure activity. Coordination and gait normal. GCS eye subscore is 4. GCS verbal subscore is 5. GCS motor subscore is 6.  On/off exam table; in/out of chair without difficulty; gait sure and steady in hallway  Skin: Skin is warm, dry and intact. Rash noted. No abrasion, no bruising, no burn, no ecchymosis, no laceration, no lesion, no petechiae and no purpura noted. Rash is papular. Rash is not macular, not maculopapular, not nodular, not pustular, not vesicular and not urticarial. She is not diaphoretic. No cyanosis or erythema. No pallor. Nails show no clubbing.     Grouped  papules with scaling nonerythematous bilateral elbows 2cm diameter lichenified; and bilateral forearms scattered lesions less than 1cm diameter not symmetrical with scaling no hyper/hypopigmentation/erythema/discharge nontender to palpation  Psychiatric: She has a normal mood and affect. Her speech is normal and behavior is normal. Judgment and thought content normal. Cognition and memory are normal.  Nursing note and vitals  reviewed.         Assessment & Plan:  A-acute recurrent maxillary sinusitis, acute bronchitis, eczema  P-Continue flonase 1 spray each nostril BID, saline 2 sprays each nostril q2h wa prn congestion.  If no improvement with 48 hours of saline and flonase use start amoxicillin 875mg  po BID x 10 days #20 RF0. Filled [facility] and given to patient  Stop afrin be more aggressive with nasal saline use.  Denied personal or family history of ENT cancer.  Shower BID especially prior to bed. No evidence of systemic bacterial infection, non toxic and well hydrated.  I do not see where any further testing or imaging is necessary at this time.   I will suggest supportive care, rest, good hygiene and encourage the patient to take adequate fluids.  The patient is to return to clinic or EMERGENCY ROOM if symptoms worsen or change significantly.  Exitcare handout on sinusitis and sinus rinse given to patient.  Patient verbalized agreement and understanding of treatment plan and had no further questions at this time.   P2:  Hand washing and cover cough   Prednisone 20mg  po daily x 7 days #21 RF0 with breakfast #21 RF0 dispensed from [facility].  Discussed possible side effects increased/decreased appetite, difficulty sleeping, increased blood sugar, increased blood pressure and heart rate. Has tessalon pearles 200mg  po TID prn cough at home.   Albuterol MDI 56mcg 1-2 puffs po q4-6h prn protracted cough/wheeze #1 RF0 side effect increased heart rate. Bronchitis simple, community acquired, may have started as viral (probably respiratory syncytial, parainfluenza, influenza, or adenovirus), but now evidence of acute purulent bronchitis with resultant bronchial edema and mucus formation.  Viruses are the most common cause of bronchial inflammation in otherwise healthy adults with acute bronchitis.  The appearance of sputum is not predictive of whether a bacterial infection is present.  Purulent sputum is most often caused by viral  infections.  There are a small portion of those caused by non-viral agents being Mycoplama pneumonia.  Microscopic examination or C&S of sputum in the healthy adult with acute bronchitis is generally not helpful (usually negative or normal respiratory flora) other considerations being cough from upper respiratory tract infections, sinusitis or allergic syndromes (mild asthma or viral pneumonia).  Differential Diagnoses:  reactive airway disease (asthma, allergic aspergillosis (eosinophilia), chronic bronchitis, respiratory infection (sinusitis, common cold, pneumonia), congestive heart failure, reflux esophagitis, bronchogenic tumor, aspiration syndromes and/or exposure to pulmonary irritants/smoke. Without high fever, severe dyspnea, lack of physical findings or other risk factors, I will hold on a chest radiograph and CBC at this time.  I discussed that approximately 50% of patients with acute bronchitis have a cough that lasts up to three weeks, and 25% for over a month.  Tylenol 500mg  one to two tablets every four to six hours as needed for fever or myalgias.  No aspirin. Exitcare handout on bronchitis and inhaler use given to patient.  ER if hemopthysis, SOB, worst chest pain of life.   Patient instructed to follow up in one week or sooner if symptoms worsen.  Patient verbalized agreement and understanding of treatment plan.  P2:  hand washing and  cover cough  Consider stopping coricidin as increasing BP and starting corticosteroids oral.  ER if chest pain, dyspnea, visual changes, worst headache of life.  Patient verbalized understanding information/instructions, agreed with plan of care and had no further questions at this time.  Medication as directed. Refilled triamcinolone 0.1% apply BID x 7 days #1 RF0 then cover with emollient BID. Symptomatic therapy suggested. Warm to cool water soaks and/or oatmeal baths. Call or return to clinic as needed if these symptoms worsen or fail to improve as  anticipated. Exitcare handout on atopic dermatitis given to patient. Patient verbalized agreement and understanding of treatment plan and had no further questions at this time.  P2: Avoidance and hand washing.

## 2017-03-16 NOTE — Patient Instructions (Signed)
Sinus Rinse What is a sinus rinse? A sinus rinse is a simple home treatment that is used to rinse your sinuses with a sterile mixture of salt and water (saline solution). Sinuses are air-filled spaces in your skull behind the bones of your face and forehead that open into your nasal cavity. You will use the following:  Saline solution.  Neti pot or spray bottle. This releases the saline solution into your nose and through your sinuses. Neti pots and spray bottles can be purchased at [employer], a health food store, or online.  When would I do a sinus rinse? A sinus rinse can help to clear mucus, dirt, dust, or pollen from the nasal cavity. You may do a sinus rinse when you have a cold, a virus, nasal allergy symptoms, a sinus infection, or stuffiness in the nose or sinuses. If you are considering a sinus rinse:  Ask your child's health care provider before performing a sinus rinse on your child.  Do not do a sinus rinse if you have had ear or nasal surgery, ear infection, or blocked ears.  How do I do a sinus rinse?  Wash your hands.  Disinfect your device according to the directions provided and then dry it.  Use the solution that comes with your device or one that is sold separately in stores. Follow the mixing directions on the package.  Fill your device with the amount of saline solution as directed by the device instructions.  Stand over a sink and tilt your head sideways over the sink.  Place the spout of the device in your upper nostril (the one closer to the ceiling).  Gently pour or squeeze the saline solution into the nasal cavity. The liquid should drain to the lower nostril if you are not overly congested.  Gently blow your nose. Blowing too hard may cause ear pain.  Repeat in the other nostril.  Clean and rinse your device with clean water and then air-dry it. Are there risks of a sinus rinse? Sinus rinse is generally very safe and effective. However,  there are a few risks, which include:  A burning sensation in the sinuses. This may happen if you do not make the saline solution as directed. Make sure to follow all directions when making the saline solution.  Infection from contaminated water. This is rare, but possible.  Nasal irritation.  This information is not intended to replace advice given to you by your health care provider. Make sure you discuss any questions you have with your health care provider. Document Released: 09/20/2013 Document Revised: 01/21/2016 Document Reviewed: 07/11/2013 Elsevier Interactive Patient Education  2017 [employer] Sinusitis, Adult Sinusitis is soreness and inflammation of your sinuses. Sinuses are hollow spaces in the bones around your face. Your sinuses are located:  Around your eyes.  In the middle of your forehead.  Behind your nose.  In your cheekbones.  Your sinuses and nasal passages are lined with a stringy fluid (mucus). Mucus normally drains out of your sinuses. When your nasal tissues become inflamed or swollen, the mucus can become trapped or blocked so air cannot flow through your sinuses. This allows bacteria, viruses, and funguses to grow, which leads to infection. Sinusitis can develop quickly and last for 7?10 days (acute) or for more than 12 weeks (chronic). Sinusitis often develops after a cold. What are the causes? This condition is caused by anything that creates swelling in the sinuses or stops mucus from draining, including:  Allergies.  Asthma.  Bacterial or viral infection.  Abnormally shaped bones between the nasal passages.  Nasal growths that contain mucus (nasal polyps).  Narrow sinus openings.  Pollutants, such as chemicals or irritants in the air.  A foreign object stuck in the nose.  A fungal infection. This is rare.  What increases the risk? The following factors may make you more likely to develop this condition:  Having allergies or  asthma.  Having had a recent cold or respiratory tract infection.  Having structural deformities or blockages in your nose or sinuses.  Having a weak immune system.  Doing a lot of swimming or diving.  Overusing nasal sprays.  Smoking.  What are the signs or symptoms? The main symptoms of this condition are pain and a feeling of pressure around the affected sinuses. Other symptoms include:  Upper toothache.  Earache.  Headache.  Bad breath.  Decreased sense of smell and taste.  A cough that may get worse at night.  Fatigue.  Fever.  Thick drainage from your nose. The drainage is often green and it may contain pus (purulent).  Stuffy nose or congestion.  Postnasal drip. This is when extra mucus collects in the throat or back of the nose.  Swelling and warmth over the affected sinuses.  Sore throat.  Sensitivity to light.  How is this diagnosed? This condition is diagnosed based on symptoms, a medical history, and a physical exam. To find out if your condition is acute or chronic, your health care provider may:  Look in your nose for signs of nasal polyps.  Tap over the affected sinus to check for signs of infection.  View the inside of your sinuses using an imaging device that has a light attached (endoscope).  If your health care provider suspects that you have chronic sinusitis, you may also:  Be tested for allergies.  Have a sample of mucus taken from your nose (nasal culture) and checked for bacteria.  Have a mucus sample examined to see if your sinusitis is related to an allergy.  If your sinusitis does not respond to treatment and it lasts longer than 8 weeks, you may have an MRI or CT scan to check your sinuses. These scans also help to determine how severe your infection is. In rare cases, a bone biopsy may be done to rule out more serious types of fungal sinus disease. How is this treated? Treatment for sinusitis depends on the cause and  whether your condition is chronic or acute. If a virus is causing your sinusitis, your symptoms will go away on their own within 10 days. You may be given medicines to relieve your symptoms, including:  Topical nasal decongestants. They shrink swollen nasal passages and let mucus drain from your sinuses.  Antihistamines. These drugs block inflammation that is triggered by allergies. This can help to ease swelling in your nose and sinuses.  Topical nasal corticosteroids. These are nasal sprays that ease inflammation and swelling in your nose and sinuses.  Nasal saline washes. These rinses can help to get rid of thick mucus in your nose.  If your condition is caused by bacteria, you will be given an antibiotic medicine. If your condition is caused by a fungus, you will be given an antifungal medicine. Surgery may be needed to correct underlying conditions, such as narrow nasal passages. Surgery may also be needed to remove polyps. Follow these instructions at home: Medicines  Take, use, or apply over-the-counter and prescription medicines only as told by  your health care provider. These may include nasal sprays.  If you were prescribed an antibiotic medicine, take it as told by your health care provider. Do not stop taking the antibiotic even if you start to feel better. Hydrate and Humidify  Drink enough water to keep your urine clear or pale yellow. Staying hydrated will help to thin your mucus.  Use a cool mist humidifier to keep the humidity level in your home above 50%.  Inhale steam for 10-15 minutes, 3-4 times a day or as told by your health care provider. You can do this in the bathroom while a hot shower is running.  Limit your exposure to cool or dry air. Rest  Rest as much as possible.  Sleep with your head raised (elevated).  Make sure to get enough sleep each night. General instructions  Apply a warm, moist washcloth to your face 3-4 times a day or as told by your  health care provider. This will help with discomfort.  Wash your hands often with soap and water to reduce your exposure to viruses and other germs. If soap and water are not available, use hand sanitizer.  Do not smoke. Avoid being around people who are smoking (secondhand smoke).  Keep all follow-up visits as told by your health care provider. This is important. Contact a health care provider if:  You have a fever.  Your symptoms get worse.  Your symptoms do not improve within 10 days. Get help right away if:  You have a severe headache.  You have persistent vomiting.  You have pain or swelling around your face or eyes.  You have vision problems.  You develop confusion.  Your neck is stiff.  You have trouble breathing. This information is not intended to replace advice given to you by your health care provider. Make sure you discuss any questions you have with your health care provider. Document Released: 02/23/2005 Document Revised: 10/20/2015 Document Reviewed: 12/19/2014 Elsevier Interactive Patient Education  2018 [employer]. How to Use a Metered Dose Inhaler A metered dose inhaler is a handheld device for taking medicine that must be breathed into the lungs (inhaled). The device can be used to deliver a variety of inhaled medicines, including:  Quick relief or rescue medicines, such as bronchodilators.  Controller medicines, such as corticosteroids.  The medicine is delivered by pushing down on a metal canister to release a preset amount of spray and medicine. Each device contains the amount of medicine that is needed for a preset number of uses (inhalations). Your health care provider may recommend that you use a spacer with your inhaler to help you take the medicine more effectively. A spacer is a plastic tube with a mouthpiece on one end and an opening that connects to the inhaler on the other end. A spacer holds the medicine in a tube for a short time, which  allows you to inhale more medicine. What are the risks? If you do not use your inhaler correctly, medicine might not reach your lungs to help you breathe. Inhaler medicine can cause side effects, such as:  Mouth or throat infection.  Cough.  Hoarseness.  Headache.  Nausea and vomiting.  Lung infection (pneumonia) in people who have a lung condition called COPD.  How to use a metered dose inhaler without a spacer 1. Remove the cap from the inhaler. 2. If you are using the inhaler for the first time, shake it for 5 seconds, turn it away from your face, then  release 4 puffs into the air. This is called priming. 3. Shake the inhaler for 5 seconds. 4. Position the inhaler so the top of the canister faces up. 5. Put your index finger on the top of the medicine canister. Support the bottom of the inhaler with your thumb. 6. Breathe out normally and as completely as possible, away from the inhaler. 7. Either place the inhaler between your teeth and close your lips tightly around the mouthpiece, or hold the inhaler 1-2 inches (2.5-5 cm) away from your open mouth. Keep your tongue down out of the way. If you are unsure which technique to use, ask your health care provider. 8. Press the canister down with your index finger to release the medicine, then inhale deeply and slowly through your mouth (not your nose) until your lungs are completely filled. Inhaling should take 4-6 seconds. 9. Hold the medicine in your lungs for 5-10 seconds (10 seconds is best). This helps the medicine get into the small airways of your lungs. 10. With your lips in a tight circle (pursed), breathe out slowly. 11. Repeat steps 3-10 until you have taken the number of puffs that your health care provider directed. Wait about 1 minute between puffs or as directed. 12. Put the cap on the inhaler. 13. If you are using a steroid inhaler, rinse your mouth with water, gargle, and spit out the water. Do not swallow the  water. How to use a metered dose inhaler with a spacer 1. Remove the cap from the inhaler. 2. If you are using the inhaler for the first time, shake it for 5 seconds, turn it away from your face, then release 4 puffs into the air. This is called priming. 3. Shake the inhaler for 5 seconds. 4. Place the open end of the spacer onto the inhaler mouthpiece. 5. Position the inhaler so the top of the canister faces up and the spacer mouthpiece faces you. 6. Put your index finger on the top of the medicine canister. Support the bottom of the inhaler and the spacer with your thumb. 7. Breathe out normally and as completely as possible, away from the spacer. 8. Place the spacer between your teeth and close your lips tightly around it. Keep your tongue down out of the way. 9. Press the canister down with your index finger to release the medicine, then inhale deeply and slowly through your mouth (not your nose) until your lungs are completely filled. Inhaling should take 4-6 seconds. 10. Hold the medicine in your lungs for 5-10 seconds (10 seconds is best). This helps the medicine get into the small airways of your lungs. 11. With your lips in a tight circle (pursed), breathe out slowly. 12. Repeat steps 3-11 until you have taken the number of puffs that your health care provider directed. Wait about 1 minute between puffs or as directed. 13. Remove the spacer from the inhaler and put the cap on the inhaler. 14. If you are using a steroid inhaler, rinse your mouth with water, gargle, and spit out the water. Do not swallow the water. Follow these instructions at home:  Take your inhaled medicine only as told by your health care provider. Do not use the inhaler more than directed by your health care provider.  Keep all follow-up visits as told by your health care provider. This is important.  If your inhaler has a counter, you can check it to determine how full your inhaler is. If your inhaler does not have  a counter, ask your health care provider when you will need to refill your inhaler and write the refill date on a calendar or on your inhaler canister. Note that you cannot know when an inhaler is empty by shaking it.  Follow directions on the package insert for care and cleaning of your inhaler and spacer. Contact a health care provider if:  Symptoms are only partially relieved with your inhaler.  You are having trouble using your inhaler.  You have an increase in phlegm.  You have headaches. Get help right away if:  You feel little or no relief after using your inhaler.  You have dizziness.  You have a fast heart rate.  You have chills or a fever.  You have night sweats.  There is blood in your phlegm. Summary  A metered dose inhaler is a handheld device for taking medicine that must be breathed into the lungs (inhaled).  The medicine is delivered by pushing down on a metal canister to release a preset amount of spray and medicine.  Each device contains the amount of medicine that is needed for a preset number of uses (inhalations). This information is not intended to replace advice given to you by your health care provider. Make sure you discuss any questions you have with your health care provider. Document Released: 02/23/2005 Document Revised: 01/14/2016 Document Reviewed: 01/14/2016 Elsevier Interactive Patient Education  2017 [employer]. Acute Bronchitis, Adult Acute bronchitis is sudden (acute) swelling of the air tubes (bronchi) in the lungs. Acute bronchitis causes these tubes to fill with mucus, which can make it hard to breathe. It can also cause coughing or wheezing. In adults, acute bronchitis usually goes away within 2 weeks. A cough caused by bronchitis may last up to 3 weeks. Smoking, allergies, and asthma can make the condition worse. Repeated episodes of bronchitis may cause further lung problems, such as chronic obstructive pulmonary disease (COPD). What  are the causes? This condition can be caused by germs and by substances that irritate the lungs, including:  Cold and flu viruses. This condition is most often caused by the same virus that causes a cold.  Bacteria.  Exposure to tobacco smoke, dust, fumes, and air pollution.  What increases the risk? This condition is more likely to develop in people who:  Have close contact with someone with acute bronchitis.  Are exposed to lung irritants, such as tobacco smoke, dust, fumes, and vapors.  Have a weak immune system.  Have a respiratory condition such as asthma.  What are the signs or symptoms? Symptoms of this condition include:  A cough.  Coughing up clear, yellow, or green mucus.  Wheezing.  Chest congestion.  Shortness of breath.  A fever.  Body aches.  Chills.  A sore throat.  How is this diagnosed? This condition is usually diagnosed with a physical exam. During the exam, your health care provider may order tests, such as chest X-rays, to rule out other conditions. He or she may also:  Test a sample of your mucus for bacterial infection.  Check the level of oxygen in your blood. This is done to check for pneumonia.  Do a chest X-ray or lung function testing to rule out pneumonia and other conditions.  Perform blood tests.  Your health care provider will also ask about your symptoms and medical history. How is this treated? Most cases of acute bronchitis clear up over time without treatment. Your health care provider may recommend:  Drinking more fluids. Drinking  more makes your mucus thinner, which may make it easier to breathe.  Taking a medicine for a fever or cough.  Taking an antibiotic medicine.  Using an inhaler to help improve shortness of breath and to control a cough.  Using a cool mist vaporizer or humidifier to make it easier to breathe.  Follow these instructions at home: Medicines  Take over-the-counter and prescription medicines  only as told by your health care provider.  If you were prescribed an antibiotic, take it as told by your health care provider. Do not stop taking the antibiotic even if you start to feel better. General instructions  Get plenty of rest.  Drink enough fluids to keep your urine clear or pale yellow.  Avoid smoking and secondhand smoke. Exposure to cigarette smoke or irritating chemicals will make bronchitis worse. If you smoke and you need help quitting, ask your health care provider. Quitting smoking will help your lungs heal faster.  Use an inhaler, cool mist vaporizer, or humidifier as told by your health care provider.  Keep all follow-up visits as told by your health care provider. This is important. How is this prevented? To lower your risk of getting this condition again:  Wash your hands often with soap and water. If soap and water are not available, use hand sanitizer.  Avoid contact with people who have cold symptoms.  Try not to touch your hands to your mouth, nose, or eyes.  Make sure to get the flu shot every year.  Contact a health care provider if:  Your symptoms do not improve in 2 weeks of treatment. Get help right away if:  You cough up blood.  You have chest pain.  You have severe shortness of breath.  You become dehydrated.  You faint or keep feeling like you are going to faint.  You keep vomiting.  You have a severe headache.  Your fever or chills gets worse. This information is not intended to replace advice given to you by your health care provider. Make sure you discuss any questions you have with your health care provider. Document Released: 04/02/2004 Document Revised: 09/18/2015 Document Reviewed: 08/14/2015 Elsevier Interactive Patient Education  2018 [employer]. Eczema Eczema is a broad term for a group of skin conditions that cause skin to become rough and inflamed. Each type of eczema has different triggers, symptoms, and treatments.  Eczema of any type is usually itchy and symptoms range from mild to severe. Eczema and its symptoms are not spread from person to person (are not contagious). It can appear on different parts of the body at different times. Your eczema may not look the same as someone else's eczema. What are the types of eczema? Atopic dermatitis This is a long-term (chronic) skin disease that keeps coming back (recurring). Usual symptoms are dry skin and small, solid pimples that may swell and leak fluid (weep). Contact dermatitis This happens when something irritates the skin and causes a rash. The irritation can come from substances that you are allergic to (allergens), such as poison ivy, chemicals, or medicines that were applied to your skin. Dyshidrotic eczema This is a form of eczema on the hands and feet. It shows up as very itchy, fluid-filled blisters. It can affect people of any age, but is more common before age 64. Hand eczema This causes very itchy areas of skin on the palms and sides of the hands and fingers. This type of eczema is common in industrial jobs where you may be  exposed to many different types of irritants. Lichen simplex chronicus This type of eczema occurs when a person constantly scratches one area of the body. Repeated scratching of the area leads to thickened skin (lichenification). Lichen simplex chronicus can occur along with other types of eczema. It is more common in adults, but may be seen in children as well. Nummular eczema This is a common type of eczema. It has no known cause. It typically causes a red, circular, crusty lesion (plaque) that may be itchy. Scratching may become a habit and can cause bleeding. Nummular eczema occurs most often in people of middle-age or older. It most often affects the hands. Seborrheic dermatitis This is a common skin disease that mainly affects the scalp. It may also affect any oily areas of the body, such as the face, sides of nose, eyebrows,  ears, eyelids, and chest. It is marked by small scaling and redness of the skin (erythema). This can affect people of all ages. In infants, this condition is known as [employer] certified [employer]." Stasis dermatitis This is a common skin disease that usually appears on the legs and feet. It most often occurs in people who have a condition that prevents blood from being pumped through the veins in the legs (chronic venous insufficiency). Stasis dermatitis is a chronic condition that needs long-term management. How is eczema diagnosed? Your health care provider will examine your skin and review your medical history. He or she may also give you skin patch tests. These tests involve taking patches that contain possible allergens and placing them on your back. He or she will then check in a few days to see if an allergic reaction occurred. What are the common treatments? Treatment for eczema is based on the type of eczema you have. Hydrocortisone steroid medicine can relieve itching quickly and help reduce inflammation. This medicine may be prescribed or obtained over-the-counter, depending on the strength of the medicine that is needed. Follow these instructions at home:  Take over-the-counter and prescription medicines only as told by your health care provider.  Use creams or ointments to moisturize your skin. Do not use lotions.  Learn what triggers or irritates your symptoms. Avoid these things.  Treat symptom flare-ups quickly.  Do not itch your skin. This can make your rash worse.  Keep all follow-up visits as told by your health care provider. This is important. Where to find more information:  The American Academy of Dermatology: http://jones-macias.info/  The National Eczema Association: www.nationaleczema.org Contact a health care provider if:  You have serious itching, even with treatment.  You regularly scratch your skin until it bleeds.  Your rash looks different than usual.  Your skin is painful, swollen,  or more red than usual.  You have a fever. Summary  There are eight general types of eczema. Each type has different triggers.  Eczema of any type causes itching that may range from mild to severe.  Treatment varies based on the type of eczema you have. Hydrocortisone steroid medicine can help with itching and inflammation.  Protecting your skin is the best way to prevent eczema. Use moisturizers and lotions. Avoid triggers and irritants, and treat flare-ups quickly. This information is not intended to replace advice given to you by your health care provider. Make sure you discuss any questions you have with your health care provider. Document Released: 07/09/2016 Document Revised: 07/09/2016 Document Reviewed: 07/09/2016 Elsevier Interactive Patient Education  2018 [employer].

## 2017-03-25 ENCOUNTER — Encounter: Payer: Self-pay | Admitting: *Deleted

## 2017-03-25 ENCOUNTER — Telehealth: Payer: Self-pay | Admitting: *Deleted

## 2017-03-25 MED ORDER — FLUTICASONE PROPIONATE 50 MCG/ACT NA SUSP: 1.0000 | Freq: Two times a day (BID) | NASAL | 6 refills | Status: DC

## 2017-03-25 NOTE — Telephone Encounter (Signed)
Pt requesting Flonase Rx as no longer carried in on site dispensary. Last filled in clinic in March 2018. Reports she uses daily but has been purchasing OTC. Pharmacy confirmed and order placed with refills.

## 2017-03-25 NOTE — Telephone Encounter (Signed)
Noted and signed for patient.

## 2017-04-20 ENCOUNTER — Telehealth: Payer: Self-pay | Admitting: *Deleted

## 2017-04-20 NOTE — Telephone Encounter (Signed)
RN saw pt in clinic yesterday. She c/o URI sx. Saw her Pcp over the weekend, Dx'd with viral URI and given Prednisone, Promethazine cough syrup and Azelastine nasal spray. She reported being concerned with 60mg  daily prednisone dosing, had not started it, and wanted to know if Otila Kluver, NP suggested other dosing possibilities to avoid side effects of high dose that for her would be "crawling on the walls." Note made to discuss with NP in clinic tomorrow. Encouraged pt to continue with nasal sprays, albuterol inhaler if needed, and cough syrup. Pt emailed from home reporting her condition had worsened, was running low grade fever, stayed home from work today. Requested Amoxicillin Rx be called in to her pharmacy. Discussed with NP. Prednisone taper of 30mg , 20mg , 10mg  over 9 days suggested and relayed to pt with instructions to continue other medications and f/u in clinic if no improvement in 48 hours. Pt responded with understanding and agreement. No further questions/concerns.

## 2017-04-21 NOTE — Telephone Encounter (Signed)
Telephone message had been left for patient to call me at home this am.  She returned my call.  Stated she took 40mg  prednisone yesterday and this am.  Tolerating azelastine nasal spray and still using nasal saline but had stopped flonase.  Using the promethazine cough syrup and taking OTC elderberry liquid for cough also.  Discussed restarting her flonase 1 spray each nostril BID in addition to azelastine and nasal saline.  She can stagger dosing of each.  Continue zyrtec 10mg  po daily.  Discussed spring allergy season is starting for flowers/trees as buds on plants and bulbs blooming.   Her biggest symptoms runny nose/post nasal drip/ congestion and cough.  Ears popping and feeling better.  Denied sinus problems.  Patient spoke full sentences in between coughing.  Stated wheezing stopped after starting prednisone.  She is resting at home and staying hydrating, eating homemade chicken noodle soup.  Denied body aches, chills, fever today, nausea/vomiting.  Patient reported she needs refill on her omeprazole from [facility] formulary tomorrow.  She will follow up for re-evaluation tomorrow if worsening of symptoms, fever or wheezing returning.  Patient verbalized understanding information/instructions, agreed with plan of care and had no further questions at this time.

## 2017-04-21 NOTE — Telephone Encounter (Signed)
Noted and agreed with plan of care prednisone 30mg  x 3 days; 20mg  x 3 days and 10mg  x 3 days.  Patient has 20mg  tabs at home so 1 1/2 tabs then 1 tab then 1/2 tab take with food at breakfast.  Patient to follow up with me in 48 hours if still running a fever and not feeling better with adding azelastine, promethazine and prednisone to her plan of care and will consider antibiotic if signs of bacterial infection.  There has been adenovirus and flu activity in our community that could also be causing fever consider tamiflu.

## 2017-04-22 ENCOUNTER — Ambulatory Visit: Payer: Self-pay | Admitting: Registered Nurse

## 2017-04-22 VITALS — BP 137/82 | HR 76 | Temp 98.5°F

## 2017-04-22 DIAGNOSIS — J0101 Acute recurrent maxillary sinusitis: Secondary | ICD-10-CM

## 2017-04-22 DIAGNOSIS — J209 Acute bronchitis, unspecified: Secondary | ICD-10-CM

## 2017-04-22 MED ORDER — ALBUTEROL SULFATE HFA 108 (90 BASE) MCG/ACT IN AERS: 2.0000 | INHALATION_SPRAY | RESPIRATORY_TRACT | 0 refills | Status: DC | PRN

## 2017-04-22 MED ORDER — PROMETHAZINE-DM 6.25-15 MG/5ML PO SYRP: ORAL_SOLUTION | ORAL | 0 refills | Status: DC

## 2017-04-22 NOTE — Progress Notes (Signed)
Subjective:    Patient ID: Sandra Lara, female    DOB: July 05, 1960, 57 y.o.   MRN: [id number]  56y/o caucasian female established Pt presents for f/u from 04/20/17 clinic. Reports sinus pain/pressure improving but cough continues with chest congestion. Has taken 20mg  Prednisone on Monday, then 40mg  past 3 days. Also using Promethazine-DM syrup QID, Flonase, Azelastine. Was not using Albuterol inh as she was concerned with combining it with all of the other medications. RN Encouraged her to restart prn use patient has at home.  She is planning to go home and rest after clinic appt worked this am.  Does feel like steroids are helping.      Review of Systems Review of Systems  Constitutional: Positive for fatigue. Negative for activity change, appetite change, chills, diaphoresis, fever and unexpected weight change.  HENT: Positive for congestion, ear pain, postnasal drip, rhinorrhea, sinus pressure, sinus pain and sneezing. Negative for dental problem, drooling, ear discharge, facial swelling, hearing loss, mouth sores, nosebleeds, sore throat, tinnitus, trouble swallowing and voice change.   Eyes: Negative for photophobia, pain, discharge, redness, itching and visual disturbance.  Respiratory: Positive for cough. Negative for choking, chest tightness, shortness of breath, wheezing and stridor.   Cardiovascular: Negative for chest pain, palpitations and leg swelling.  Gastrointestinal: Negative for abdominal distention, abdominal pain, blood in stool, constipation, diarrhea, nausea and vomiting.  Endocrine: Negative for cold intolerance and heat intolerance.  Genitourinary: Negative for difficulty urinating, dysuria and hematuria.  Musculoskeletal: Negative for arthralgias, back pain, gait problem, joint swelling, myalgias, neck pain and neck stiffness.  Skin: Negative for color change, pallor, rash and wound.  Allergic/Immunologic: Positive for environmental allergies. Negative for food  allergies.  Neurological: Positive for headaches. Negative for dizziness, tremors, seizures, syncope, facial asymmetry, speech difficulty, weakness, light-headedness and numbness.  Hematological: Negative for adenopathy. Does not bruise/bleed easily.  Psychiatric/Behavioral: Positive for sleep disturbance. Negative for agitation, behavioral problems and confusion.     Objective:   Physical Exam  Constitutional: She is oriented to person, place, and time. She appears well-developed and well-nourished. She is active and cooperative.  Non-toxic appearance. She does not have a sickly appearance. She appears ill. No distress.  HENT:  Head: Normocephalic and atraumatic.  Right Ear: Hearing, external ear and ear canal normal. A middle ear effusion is present.  Left Ear: Hearing, external ear and ear canal normal. A middle ear effusion is present.  Nose: Mucosal edema and rhinorrhea present. No nose lacerations, sinus tenderness, nasal deformity, septal deviation or nasal septal hematoma. No epistaxis.  No foreign bodies. Right sinus exhibits maxillary sinus tenderness. Right sinus exhibits no frontal sinus tenderness. Left sinus exhibits maxillary sinus tenderness. Left sinus exhibits no frontal sinus tenderness.  Mouth/Throat: Uvula is midline and mucous membranes are normal. Mucous membranes are not pale, not dry and not cyanotic. She does not have dentures. No oral lesions. No trismus in the jaw. Normal dentition. No dental abscesses, uvula swelling, lacerations or dental caries. Posterior oropharyngeal edema and posterior oropharyngeal erythema present. No oropharyngeal exudate or tonsillar abscesses.  Mild maxillary pressure with palpation bilaterally; bilateral TMs air fluid level clear; cobblestoning less pronounced oropharynx; bilateral allergic shiners; bilateral nasal turbinates scant discharge clear patient blowing nose frequently carrying tissues in hand  Eyes: Conjunctivae, EOM and lids are  normal. Pupils are equal, round, and reactive to light. Right eye exhibits no chemosis, no discharge, no exudate and no hordeolum. No foreign body present in the right eye. Left eye exhibits  no chemosis, no discharge, no exudate and no hordeolum. No foreign body present in the left eye. Right conjunctiva is not injected. Right conjunctiva has no hemorrhage. Left conjunctiva is not injected. Left conjunctiva has no hemorrhage. No scleral icterus. Right eye exhibits normal extraocular motion and no nystagmus. Left eye exhibits normal extraocular motion and no nystagmus. Right pupil is round and reactive. Left pupil is round and reactive. Pupils are equal.  Neck: Trachea normal and normal range of motion. Neck supple. No tracheal tenderness, no spinous process tenderness and no muscular tenderness present. No neck rigidity. No tracheal deviation, no edema, no erythema and normal range of motion present. No thyroid mass and no thyromegaly present.  Cardiovascular: Normal rate, regular rhythm, S1 normal, S2 normal, normal heart sounds and intact distal pulses. PMI is not displaced. Exam reveals no gallop and no friction rub.  No murmur heard. Pulmonary/Chest: Effort normal. No accessory muscle usage or stridor. No respiratory distress. She has no decreased breath sounds. She has wheezes in the right upper field and the right middle field. She has no rhonchi. She has no rales. She exhibits no tenderness.  End expiratory wheeze after coughing spo2 96% with cough; spoke full sentences without difficulty  Abdominal: Soft. She exhibits no distension.  Musculoskeletal: Normal range of motion. She exhibits no edema or tenderness.       Right shoulder: Normal.       Left shoulder: Normal.       Right hip: Normal.       Left hip: Normal.       Right knee: Normal.       Left knee: Normal.       Cervical back: Normal.       Right hand: Normal.       Left hand: Normal.  Lymphadenopathy:       Head (right side): No  submental, no submandibular, no tonsillar, no preauricular, no posterior auricular and no occipital adenopathy present.       Head (left side): No submental, no submandibular, no tonsillar, no preauricular, no posterior auricular and no occipital adenopathy present.    She has no cervical adenopathy.       Right cervical: No superficial cervical, no deep cervical and no posterior cervical adenopathy present.      Left cervical: No superficial cervical, no deep cervical and no posterior cervical adenopathy present.  Neurological: She is alert and oriented to person, place, and time. She has normal strength. She is not disoriented. She displays no atrophy and no tremor. No cranial nerve deficit or sensory deficit. She exhibits normal muscle tone. She displays no seizure activity. Coordination and gait normal. GCS eye subscore is 4. GCS verbal subscore is 5. GCS motor subscore is 6.  Skin: Skin is warm, dry and intact. No abrasion, no bruising, no burn, no ecchymosis, no laceration, no lesion, no petechiae and no rash noted. She is not diaphoretic. No cyanosis or erythema. No pallor. Nails show no clubbing.  Psychiatric: She has a normal mood and affect. Her speech is normal and behavior is normal. Judgment and thought content normal. Cognition and memory are normal.  Nursing note and vitals reviewed.         Assessment & Plan:  A-BRONCHITIS ACUTE AND ACUTE MAXILLARY SINUSITIS  P-refilled promethazine DM 61ml po QID prn cough #118 RF0 and albuterol inhaler electronic Rx.  She has not used albuterol inhaler discussed with patient .  Finish all other medications as prescribed.  Agree  with patient prednisone taper plans for this weekend 20mg /10mg  po with  Breakfast. Cough lozenges po q2h prn cough Prednisone Discussed possible side effects increased/decreased appetite, difficulty sleeping, increased blood sugar, increased blood pressure and heart rate. refilled Albuterol MDI 65mcg 1-2 puffs po q4-6h prn  protracted cough/wheeze #1 RF0 side effect increased heart rate. Bronchitis simple, community acquired, may have started as viral (probably respiratory syncytial, parainfluenza, influenza, or adenovirus), but now evidence of acute purulent bronchitis with resultant bronchial edema and mucus formation.  Viruses are the most common cause of bronchial inflammation in otherwise healthy adults with acute bronchitis.  The appearance of sputum is not predictive of whether a bacterial infection is present.  Purulent sputum is most often caused by viral infections.  There are a small portion of those caused by non-viral agents being Mycoplama pneumonia.  Microscopic examination or C&S of sputum in the healthy adult with acute bronchitis is generally not helpful (usually negative or normal respiratory flora) other considerations being cough from upper respiratory tract infections, sinusitis or allergic syndromes (mild asthma or viral pneumonia).  Differential Diagnoses:  reactive airway disease (asthma, allergic aspergillosis (eosinophilia), chronic bronchitis, respiratory infection (sinusitis, common cold, pneumonia), congestive heart failure, reflux esophagitis, bronchogenic tumor, aspiration syndromes and/or exposure to pulmonary irritants/smoke.Without high fever, severe dyspnea, lack of physical findings or other risk factors, I will hold on a chest radiograph and CBC at this time.  I discussed that approximately 50% of patients with acute bronchitis have a cough that lasts up to three weeks, and 25% for over a month.  Tylenol 500mg  one to two tablets every four to six hours as needed for fever or myalgias.  No aspirin. Exitcare handout on bronchitis and inhaler use given to patient.  ER if hemopthysis, SOB, worst chest pain of life.   Patient instructed to follow up in one week or sooner if symptoms worsen.  Patient verbalized agreement and understanding of treatment plan.  P2:  hand washing and cover cough   flonase  1 spray each nostril BID, saline 2 sprays each nostril q2h wa prn congestion. Continue steroid taper.  Recently completed amoxicillin previously pressure and pain now only with palpation (improving)  Continue azelastine nasal spray. Denied personal or family history of ENT cancer.  Shower BID especially prior to bed. No evidence of systemic bacterial infection, non toxic and well hydrated.  I do not see where any further testing or imaging is necessary at this time.   I will suggest supportive care, rest, good hygiene and encourage the patient to take adequate fluids.  The patient is to return to clinic or EMERGENCY ROOM if symptoms worsen or change significantly.  Exitcare handout on sinusitis and sinus rinse given to patient.  Patient verbalized agreement and understanding of treatment plan and had no further questions at this time.   P2:  Hand washing and cover cough  Patient may use normal saline nasal spray 2 sprays each nostril q2h wa as needed given bottle from clinic stock and has at home flonase 10mcg 1 spray each nostril BID.  Patient denied personal or family history of ENT cancer. Continue azelastine nasal spray, stop afrin.  consider OTC antihistamine of choice claritin/zyrtec 10mg  po daily.  Avoid triggers if possible.  Shower prior to bedtime if exposed to triggers.  If allergic dust/dust mites recommend mattress/pillow covers/encasements; washing linens, vacuuming, sweeping, dusting weekly.  Call or return to clinic as needed if these symptoms worsen or fail to improve as anticipated.  Exitcare handout on allergic rhinitis and sinus rinse given to patient.  Patient verbalized understanding of instructions, agreed with plan of care and had no further questions at this time.  P2:  Avoidance and hand washing.

## 2017-04-23 MED ORDER — SALINE SPRAY 0.65 % NA SOLN: 2.0000 | NASAL | 0 refills | Status: DC

## 2017-04-23 NOTE — Patient Instructions (Addendum)
Acute Bronchitis, Adult Acute bronchitis is sudden (acute) swelling of the air tubes (bronchi) in the lungs. Acute bronchitis causes these tubes to fill with mucus, which can make it hard to breathe. It can also cause coughing or wheezing. In adults, acute bronchitis usually goes away within 2 weeks. A cough caused by bronchitis may last up to 3 weeks. Smoking, allergies, and asthma can make the condition worse. Repeated episodes of bronchitis may cause further lung problems, such as chronic obstructive pulmonary disease (COPD). What are the causes? This condition can be caused by germs and by substances that irritate the lungs, including:  Cold and flu viruses. This condition is most often caused by the same virus that causes a cold.  Bacteria.  Exposure to tobacco smoke, dust, fumes, and air pollution.  What increases the risk? This condition is more likely to develop in people who:  Have close contact with someone with acute bronchitis.  Are exposed to lung irritants, such as tobacco smoke, dust, fumes, and vapors.  Have a weak immune system.  Have a respiratory condition such as asthma.  What are the signs or symptoms? Symptoms of this condition include:  A cough.  Coughing up clear, yellow, or green mucus.  Wheezing.  Chest congestion.  Shortness of breath.  A fever.  Body aches.  Chills.  A sore throat.  How is this diagnosed? This condition is usually diagnosed with a physical exam. During the exam, your health care provider may order tests, such as chest X-rays, to rule out other conditions. He or she may also:  Test a sample of your mucus for bacterial infection.  Check the level of oxygen in your blood. This is done to check for pneumonia.  Do a chest X-ray or lung function testing to rule out pneumonia and other conditions.  Perform blood tests.  Your health care provider will also ask about your symptoms and medical history. How is this  treated? Most cases of acute bronchitis clear up over time without treatment. Your health care provider may recommend:  Drinking more fluids. Drinking more makes your mucus thinner, which may make it easier to breathe.  Taking a medicine for a fever or cough.  Taking an antibiotic medicine.  Using an inhaler to help improve shortness of breath and to control a cough.  Using a cool mist vaporizer or humidifier to make it easier to breathe.  Follow these instructions at home: Medicines  Take over-the-counter and prescription medicines only as told by your health care provider.  If you were prescribed an antibiotic, take it as told by your health care provider. Do not stop taking the antibiotic even if you start to feel better. General instructions  Get plenty of rest.  Drink enough fluids to keep your urine clear or pale yellow.  Avoid smoking and secondhand smoke. Exposure to cigarette smoke or irritating chemicals will make bronchitis worse. If you smoke and you need help quitting, ask your health care provider. Quitting smoking will help your lungs heal faster.  Use an inhaler, cool mist vaporizer, or humidifier as told by your health care provider.  Keep all follow-up visits as told by your health care provider. This is important. How is this prevented? To lower your risk of getting this condition again:  Wash your hands often with soap and water. If soap and water are not available, use hand sanitizer.  Avoid contact with people who have cold symptoms.  Try not to touch your hands to your   mouth, nose, or eyes.  Make sure to get the flu shot every year.  Contact a health care provider if:  Your symptoms do not improve in 2 weeks of treatment. Get help right away if:  You cough up blood.  You have chest pain.  You have severe shortness of breath.  You become dehydrated.  You faint or keep feeling like you are going to faint.  You keep vomiting.  You have a  severe headache.  Your fever or chills gets worse. This information is not intended to replace advice given to you by your health care provider. Make sure you discuss any questions you have with your health care provider. Document Released: 04/02/2004 Document Revised: 09/18/2015 Document Reviewed: 08/14/2015 Elsevier Interactive Patient Education  2018 [employer]. Sinus Rinse What is a sinus rinse? A sinus rinse is a simple home treatment that is used to rinse your sinuses with a sterile mixture of salt and water (saline solution). Sinuses are air-filled spaces in your skull behind the bones of your face and forehead that open into your nasal cavity. You will use the following:  Saline solution.  Neti pot or spray bottle. This releases the saline solution into your nose and through your sinuses. Neti pots and spray bottles can be purchased at [employer], a health food store, or online.  When would I do a sinus rinse? A sinus rinse can help to clear mucus, dirt, dust, or pollen from the nasal cavity. You may do a sinus rinse when you have a cold, a virus, nasal allergy symptoms, a sinus infection, or stuffiness in the nose or sinuses. If you are considering a sinus rinse:  Ask your child's health care provider before performing a sinus rinse on your child.  Do not do a sinus rinse if you have had ear or nasal surgery, ear infection, or blocked ears.  How do I do a sinus rinse?  Wash your hands.  Disinfect your device according to the directions provided and then dry it.  Use the solution that comes with your device or one that is sold separately in stores. Follow the mixing directions on the package.  Fill your device with the amount of saline solution as directed by the device instructions.  Stand over a sink and tilt your head sideways over the sink.  Place the spout of the device in your upper nostril (the one closer to the ceiling).  Gently pour or squeeze the  saline solution into the nasal cavity. The liquid should drain to the lower nostril if you are not overly congested.  Gently blow your nose. Blowing too hard may cause ear pain.  Repeat in the other nostril.  Clean and rinse your device with clean water and then air-dry it. Are there risks of a sinus rinse? Sinus rinse is generally very safe and effective. However, there are a few risks, which include:  A burning sensation in the sinuses. This may happen if you do not make the saline solution as directed. Make sure to follow all directions when making the saline solution.  Infection from contaminated water. This is rare, but possible.  Nasal irritation.  This information is not intended to replace advice given to you by your health care provider. Make sure you discuss any questions you have with your health care provider. Document Released: 09/20/2013 Document Revised: 01/21/2016 Document Reviewed: 07/11/2013 Elsevier Interactive Patient Education  2017 [employer].  How to Use a Metered Dose Inhaler A metered  dose inhaler is a handheld device for taking medicine that must be breathed into the lungs (inhaled). The device can be used to deliver a variety of inhaled medicines, including:  Quick relief or rescue medicines, such as bronchodilators.  Controller medicines, such as corticosteroids.  The medicine is delivered by pushing down on a metal canister to release a preset amount of spray and medicine. Each device contains the amount of medicine that is needed for a preset number of uses (inhalations). Your health care provider may recommend that you use a spacer with your inhaler to help you take the medicine more effectively. A spacer is a plastic tube with a mouthpiece on one end and an opening that connects to the inhaler on the other end. A spacer holds the medicine in a tube for a short time, which allows you to inhale more medicine. What are the risks? If you do not use your  inhaler correctly, medicine might not reach your lungs to help you breathe. Inhaler medicine can cause side effects, such as:  Mouth or throat infection.  Cough.  Hoarseness.  Headache.  Nausea and vomiting.  Lung infection (pneumonia) in people who have a lung condition called COPD.  How to use a metered dose inhaler without a spacer 1. Remove the cap from the inhaler. 2. If you are using the inhaler for the first time, shake it for 5 seconds, turn it away from your face, then release 4 puffs into the air. This is called priming. 3. Shake the inhaler for 5 seconds. 4. Position the inhaler so the top of the canister faces up. 5. Put your index finger on the top of the medicine canister. Support the bottom of the inhaler with your thumb. 6. Breathe out normally and as completely as possible, away from the inhaler. 7. Either place the inhaler between your teeth and close your lips tightly around the mouthpiece, or hold the inhaler 1-2 inches (2.5-5 cm) away from your open mouth. Keep your tongue down out of the way. If you are unsure which technique to use, ask your health care provider. 8. Press the canister down with your index finger to release the medicine, then inhale deeply and slowly through your mouth (not your nose) until your lungs are completely filled. Inhaling should take 4-6 seconds. 9. Hold the medicine in your lungs for 5-10 seconds (10 seconds is best). This helps the medicine get into the small airways of your lungs. 10. With your lips in a tight circle (pursed), breathe out slowly. 11. Repeat steps 3-10 until you have taken the number of puffs that your health care provider directed. Wait about 1 minute between puffs or as directed. 12. Put the cap on the inhaler. 13. If you are using a steroid inhaler, rinse your mouth with water, gargle, and spit out the water. Do not swallow the water. How to use a metered dose inhaler with a spacer 1. Remove the cap from the  inhaler. 2. If you are using the inhaler for the first time, shake it for 5 seconds, turn it away from your face, then release 4 puffs into the air. This is called priming. 3. Shake the inhaler for 5 seconds. 4. Place the open end of the spacer onto the inhaler mouthpiece. 5. Position the inhaler so the top of the canister faces up and the spacer mouthpiece faces you. 6. Put your index finger on the top of the medicine canister. Support the bottom of the inhaler and the  spacer with your thumb. 7. Breathe out normally and as completely as possible, away from the spacer. 8. Place the spacer between your teeth and close your lips tightly around it. Keep your tongue down out of the way. 9. Press the canister down with your index finger to release the medicine, then inhale deeply and slowly through your mouth (not your nose) until your lungs are completely filled. Inhaling should take 4-6 seconds. 10. Hold the medicine in your lungs for 5-10 seconds (10 seconds is best). This helps the medicine get into the small airways of your lungs. 11. With your lips in a tight circle (pursed), breathe out slowly. 12. Repeat steps 3-11 until you have taken the number of puffs that your health care provider directed. Wait about 1 minute between puffs or as directed. 13. Remove the spacer from the inhaler and put the cap on the inhaler. 14. If you are using a steroid inhaler, rinse your mouth with water, gargle, and spit out the water. Do not swallow the water. Follow these instructions at home:  Take your inhaled medicine only as told by your health care provider. Do not use the inhaler more than directed by your health care provider.  Keep all follow-up visits as told by your health care provider. This is important.  If your inhaler has a counter, you can check it to determine how full your inhaler is. If your inhaler does not have a counter, ask your health care provider when you will need to refill your inhaler  and write the refill date on a calendar or on your inhaler canister. Note that you cannot know when an inhaler is empty by shaking it.  Follow directions on the package insert for care and cleaning of your inhaler and spacer. Contact a health care provider if:  Symptoms are only partially relieved with your inhaler.  You are having trouble using your inhaler.  You have an increase in phlegm.  You have headaches. Get help right away if:  You feel little or no relief after using your inhaler.  You have dizziness.  You have a fast heart rate.  You have chills or a fever.  You have night sweats.  There is blood in your phlegm. Summary  A metered dose inhaler is a handheld device for taking medicine that must be breathed into the lungs (inhaled).  The medicine is delivered by pushing down on a metal canister to release a preset amount of spray and medicine.  Each device contains the amount of medicine that is needed for a preset number of uses (inhalations). This information is not intended to replace advice given to you by your health care provider. Make sure you discuss any questions you have with your health care provider. Document Released: 02/23/2005 Document Revised: 01/14/2016 Document Reviewed: 01/14/2016 Elsevier Interactive Patient Education  2017 [employer]  Sinusitis, Adult Sinusitis is soreness and inflammation of your sinuses. Sinuses are hollow spaces in the bones around your face. Your sinuses are located:  Around your eyes.  In the middle of your forehead.  Behind your nose.  In your cheekbones.  Your sinuses and nasal passages are lined with a stringy fluid (mucus). Mucus normally drains out of your sinuses. When your nasal tissues become inflamed or swollen, the mucus can become trapped or blocked so air cannot flow through your sinuses. This allows bacteria, viruses, and funguses to grow, which leads to infection. Sinusitis can develop quickly and  last for 7?10 days (acute)  or for more than 12 weeks (chronic). Sinusitis often develops after a cold. What are the causes? This condition is caused by anything that creates swelling in the sinuses or stops mucus from draining, including:  Allergies.  Asthma.  Bacterial or viral infection.  Abnormally shaped bones between the nasal passages.  Nasal growths that contain mucus (nasal polyps).  Narrow sinus openings.  Pollutants, such as chemicals or irritants in the air.  A foreign object stuck in the nose.  A fungal infection. This is rare.  What increases the risk? The following factors may make you more likely to develop this condition:  Having allergies or asthma.  Having had a recent cold or respiratory tract infection.  Having structural deformities or blockages in your nose or sinuses.  Having a weak immune system.  Doing a lot of swimming or diving.  Overusing nasal sprays.  Smoking.  What are the signs or symptoms? The main symptoms of this condition are pain and a feeling of pressure around the affected sinuses. Other symptoms include:  Upper toothache.  Earache.  Headache.  Bad breath.  Decreased sense of smell and taste.  A cough that may get worse at night.  Fatigue.  Fever.  Thick drainage from your nose. The drainage is often green and it may contain pus (purulent).  Stuffy nose or congestion.  Postnasal drip. This is when extra mucus collects in the throat or back of the nose.  Swelling and warmth over the affected sinuses.  Sore throat.  Sensitivity to light.  How is this diagnosed? This condition is diagnosed based on symptoms, a medical history, and a physical exam. To find out if your condition is acute or chronic, your health care provider may:  Look in your nose for signs of nasal polyps.  Tap over the affected sinus to check for signs of infection.  View the inside of your sinuses using an imaging device that has a  light attached (endoscope).  If your health care provider suspects that you have chronic sinusitis, you may also:  Be tested for allergies.  Have a sample of mucus taken from your nose (nasal culture) and checked for bacteria.  Have a mucus sample examined to see if your sinusitis is related to an allergy.  If your sinusitis does not respond to treatment and it lasts longer than 8 weeks, you may have an MRI or CT scan to check your sinuses. These scans also help to determine how severe your infection is. In rare cases, a bone biopsy may be done to rule out more serious types of fungal sinus disease. How is this treated? Treatment for sinusitis depends on the cause and whether your condition is chronic or acute. If a virus is causing your sinusitis, your symptoms will go away on their own within 10 days. You may be given medicines to relieve your symptoms, including:  Topical nasal decongestants. They shrink swollen nasal passages and let mucus drain from your sinuses.  Antihistamines. These drugs block inflammation that is triggered by allergies. This can help to ease swelling in your nose and sinuses.  Topical nasal corticosteroids. These are nasal sprays that ease inflammation and swelling in your nose and sinuses.  Nasal saline washes. These rinses can help to get rid of thick mucus in your nose.  If your condition is caused by bacteria, you will be given an antibiotic medicine. If your condition is caused by a fungus, you will be given an antifungal medicine. Surgery may be  needed to correct underlying conditions, such as narrow nasal passages. Surgery may also be needed to remove polyps. Follow these instructions at home: Medicines  Take, use, or apply over-the-counter and prescription medicines only as told by your health care provider. These may include nasal sprays.  If you were prescribed an antibiotic medicine, take it as told by your health care provider. Do not stop taking  the antibiotic even if you start to feel better. Hydrate and Humidify  Drink enough water to keep your urine clear or pale yellow. Staying hydrated will help to thin your mucus.  Use a cool mist humidifier to keep the humidity level in your home above 50%.  Inhale steam for 10-15 minutes, 3-4 times a day or as told by your health care provider. You can do this in the bathroom while a hot shower is running.  Limit your exposure to cool or dry air. Rest  Rest as much as possible.  Sleep with your head raised (elevated).  Make sure to get enough sleep each night. General instructions  Apply a warm, moist washcloth to your face 3-4 times a day or as told by your health care provider. This will help with discomfort.  Wash your hands often with soap and water to reduce your exposure to viruses and other germs. If soap and water are not available, use hand sanitizer.  Do not smoke. Avoid being around people who are smoking (secondhand smoke).  Keep all follow-up visits as told by your health care provider. This is important. Contact a health care provider if:  You have a fever.  Your symptoms get worse.  Your symptoms do not improve within 10 days. Get help right away if:  You have a severe headache.  You have persistent vomiting.  You have pain or swelling around your face or eyes.  You have vision problems.  You develop confusion.  Your neck is stiff.  You have trouble breathing. This information is not intended to replace advice given to you by your health care provider. Make sure you discuss any questions you have with your health care provider. Document Released: 02/23/2005 Document Revised: 10/20/2015 Document Reviewed: 12/19/2014 Elsevier Interactive Patient Education  [employer].

## 2017-08-26 ENCOUNTER — Ambulatory Visit: Payer: PRIVATE HEALTH INSURANCE | Admitting: Family Medicine

## 2017-08-26 ENCOUNTER — Other Ambulatory Visit: Payer: Self-pay

## 2017-08-26 ENCOUNTER — Encounter: Payer: Self-pay | Admitting: Family Medicine

## 2017-08-26 VITALS — BP 130/86 | HR 78 | Temp 98.0°F | Resp 16 | Ht <= 58 in | Wt 203.0 lb

## 2017-08-26 DIAGNOSIS — M4316 Spondylolisthesis, lumbar region: Secondary | ICD-10-CM

## 2017-08-26 DIAGNOSIS — M5136 Other intervertebral disc degeneration, lumbar region: Secondary | ICD-10-CM | POA: Diagnosis not present

## 2017-08-26 DIAGNOSIS — R29898 Other symptoms and signs involving the musculoskeletal system: Secondary | ICD-10-CM

## 2017-08-26 DIAGNOSIS — M4726 Other spondylosis with radiculopathy, lumbar region: Secondary | ICD-10-CM

## 2017-08-26 DIAGNOSIS — M47816 Spondylosis without myelopathy or radiculopathy, lumbar region: Secondary | ICD-10-CM | POA: Insufficient documentation

## 2017-08-26 NOTE — Patient Instructions (Signed)
It was so good seeing you again! Thank you for establishing with my new practice and allowing me to continue caring for you. It means a lot to me.   Please schedule a follow up appointment with me in November for your annual complete physical; please come fasting.  We will call you with information regarding your referral appointment. I'd like to get an MRI of your lower back.  If you do not hear from [place] within the next 2 weeks, please let me know. It can take 1-2 weeks to get appointments set up with the specialists.    Go to ER for worsening symptoms: worse weakness, back pain, or bowel/bladder dysfunction.

## 2017-08-26 NOTE — Progress Notes (Signed)
Subjective  CC:  Chief Complaint  Patient presents with  . Establish Care    Former [facility] pt, noticed right quad has lost strength    HPI: Sandra Lara is a 57 y.o. female is a former [facility] patient and is here to reestablish care with me today.  Last CPE nove 2018  She has the following concerns or needs:  57 year old former patient of mine presents for lower extremity weakness.  I reviewed recent notes from care everywhere.  She has long history of right-sided low back pain or muscle tenderness with history of bursitis.  Former work-up revealed diffuse DDD and lumbar spine.  She typically will manage the symptoms with massage therapy.  About a week ago she noted some increase in muscular tightness in that area again, then when trying to step up her garage stairs she fell due to inability to bear her full weight on her right lower extremity.  She was evaluated at her prior PCPs office.  She was started on a prednisone taper for presumed nerve impingement symptoms due to quad weakness noticed on exam.  She believes that things are improving however she has persistent weakness.  She has no bowel or bladder incontinence.  She denies low back pain at this time.  She has no knee pain or groin pain.  No history of arthritis of the knees.  She denies decreased sensation.  Chronic GERD on omeprazole, needing refill.  Well-controlled  Reviewed and updated problem list.  Health maintenance: Due for mammogram.  Assessment  1. Right leg weakness   2. Other intervertebral disc degeneration, lumbar region   3. Spondylolisthesis of lumbar region   4. Osteoarthritis of spine with radiculopathy, lumbar region      Plan   Right leg weakness: Concerning for radiculopathy.  MRI of lumbar spine ordered.  Discussed red flag symptoms.  Further management once results are back.  Refilled omeprazole   Follow up:  No follow-ups on file.  Orders Placed This Encounter  Procedures  . MR Lumbar  Spine Wo Contrast   No orders of the defined types were placed in this encounter.     We updated and reviewed the patient's past history in detail and it is documented below.  Patient Active Problem List   Diagnosis Date Noted  . DJD (degenerative joint disease), lumbar 08/26/2017    Xray 2017   . Class 3 severe obesity due to excess calories without serious comorbidity with body mass index (BMI) of 40.0 to 44.9 in adult ([place]) 10/29/2016  . Seasonal allergic rhinitis due to pollen 05/21/2016  . Tubular adenoma of colon 11/26/2015    Overview:  Repeat q 5 years. Next 2022   . Diverticulosis of large intestine 05/24/2015    Overview:  Colonoscopy - has had recurrent diverticulitis x 3 in 2016   . Gastroesophageal reflux disease without esophagitis 09/28/2014  . Menopause syndrome 09/28/2014    Overview:  Lyn Henri MD - [facility]    Health Maintenance  Topic Date Due  . Hepatitis C Screening  [birth year]  . MAMMOGRAM  12/29/2016  . HIV Screening  08/27/2018 (Originally 09/02/1975)  . INFLUENZA VACCINE  10/07/2017  . PAP SMEAR  05/23/2020  . TETANUS/TDAP  09/27/2024  . COLONOSCOPY  11/21/2025   Immunization History  Administered Date(s) Administered  . Influenza,inj,Quad PF,6+ Mos 03/19/2016  . Tdap 09/28/2014   Current Meds  Medication Sig  . albuterol (PROVENTIL HFA;VENTOLIN HFA) 108 (90 Base) MCG/ACT inhaler Inhale  2 puffs into the lungs every 4 (four) hours as needed.  . Ascorbic Acid (VITAMIN C) 100 MG tablet Take 100 mg by mouth daily.  . Azelastine HCl 137 MCG/SPRAY SOLN USE 1 SPRAY IN EACH NOSTRIL 2 TIMES DAILY AS DIRECTED  . Cholecalciferol (VITAMIN D3) 5000 units TABS Take by mouth daily.  . cyclobenzaprine (FLEXERIL) 5 MG tablet Take one tablet (5 mg dose) by mouth 3 (three) times a day as needed for Muscle spasms for up to 10 days.  Marland Kitchen omega-3 acid ethyl esters (LOVAZA) 1 G capsule Take by mouth 2 (two) times daily.  Marland Kitchen omeprazole (PRILOSEC) 20  MG capsule Take 1 capsule by mouth daily.  . predniSONE (DELTASONE) 20 MG tablet Take 60 mg by mouth daily.  . sodium chloride (OCEAN) 0.65 % SOLN nasal spray Place 2 sprays into both nostrils every 2 (two) hours while awake.    Allergies: Patient is allergic to sulfamethoxazole-trimethoprim; doxycycline; erythromycin; achromycin [tetracycline]; and macrolides and ketolides. Past Medical History Patient  has a past medical history of Allergy, Diverticulitis, Diverticulitis, Family history of polyps in the colon, GERD (gastroesophageal reflux disease), and Hyperlipidemia. Past Surgical History Patient  has a past surgical history that includes Cholecystectomy (2005); Wrist fracture surgery (Right); Gallbladder surgery (2005); Bone graft hip iliac crest; colon polyps ; and prolapse. Family History: Patient family history includes Arthritis in her mother; Breast cancer (age of onset: 38) in her mother; COPD in her mother; Cancer in her mother; Diabetes in her maternal grandfather; Hearing loss in her mother; Heart attack in her brother and maternal grandfather; Hyperlipidemia in her mother; Stroke in her maternal grandmother. Social History:  Patient  reports that she has never smoked. She has never used smokeless tobacco. She reports that she does not use drugs.  Review of Systems: Constitutional: negative for fever or malaise Ophthalmic: negative for photophobia, double vision or loss of vision Cardiovascular: negative for chest pain, dyspnea on exertion, or new LE swelling Respiratory: negative for SOB or persistent cough Gastrointestinal: negative for abdominal pain, change in bowel habits or melena Genitourinary: negative for dysuria or gross hematuria Musculoskeletal: POSITIVE for muscular weakness of right leg Integumentary: negative for new or persistent rashes Neurological: negative for TIA or stroke symptoms Psychiatric: negative for SI or delusions Allergic/Immunologic: negative  for hives  Patient Care Team    Relationship Specialty Notifications Start End  [name], MD PCP - General Family Medicine  08/26/17   [name], MD Referring Physician Gastroenterology  08/26/17   [name], MD Consulting Physician Ophthalmology  08/26/17   Dr. Juleen Starr    08/26/17    Comment: Dentist, [phone]    Objective  Vitals: BP 130/86   Pulse 78   Temp 98 F (36.7 C) (Oral)   Resp 16   Ht 4' 9.5" (1.461 m)   Wt 203 lb (92.1 kg)   SpO2 98%   BMI 43.17 kg/m  General:  Well developed, well nourished, no acute distress  Psych:  Alert and oriented,normal mood and affect Cardiovascular:  RRR without gallop, rub or murmur, nondisplaced PMI Respiratory:  Good breath sounds bilaterally, CTAB with normal respiratory effort MSK: no deformities, contusions. Joints are without erythema or swelling Skin:  Warm, no rashes or suspicious lesions noted  Neurologic:    Mental status is normal. Right LE: 4+/5 right quad and hamstring, 5/5 iliopsoas, 5/5 lower leg/foot. Left leg 5/5 throughout, +2+ bilateral symmetric LE reflexes, nl sensation, Neg SLR bilateral.   Back:  no ttp of lumbar spine, paravertebral mm, SI joint or sciatic notches, no hip ttp; pt can't step up onto table with right leg alone.    Commons side effects, risks, benefits, and alternatives for medications and treatment plan prescribed today were discussed, and the patient expressed understanding of the given instructions. Patient is instructed to call or message via MyChart if he/she has any questions or concerns regarding our treatment plan. No barriers to understanding were identified. We discussed Red Flag symptoms and signs in detail. Patient expressed understanding regarding what to do in case of urgent or emergency type symptoms.   Medication list was reconciled, printed and provided to the patient in AVS. Patient instructions and summary information was reviewed with the patient as documented  in the AVS. This note was prepared with assistance of Dragon voice recognition software. Occasional wrong-word or sound-a-like substitutions may have occurred due to the inherent limitations of voice recognition software

## 2017-08-31 ENCOUNTER — Ambulatory Visit
Admission: RE | Admit: 2017-08-31 | Discharge: 2017-08-31 | Disposition: A | Discharge status: Home / Self Care | Payer: PRIVATE HEALTH INSURANCE | Source: Ambulatory Visit | Attending: Family Medicine | Admitting: Family Medicine

## 2017-08-31 DIAGNOSIS — M47816 Spondylosis without myelopathy or radiculopathy, lumbar region: Secondary | ICD-10-CM

## 2017-08-31 DIAGNOSIS — M5136 Other intervertebral disc degeneration, lumbar region: Secondary | ICD-10-CM

## 2017-08-31 DIAGNOSIS — R29898 Other symptoms and signs involving the musculoskeletal system: Secondary | ICD-10-CM

## 2017-08-31 DIAGNOSIS — M4316 Spondylolisthesis, lumbar region: Secondary | ICD-10-CM

## 2017-08-31 NOTE — Progress Notes (Signed)
Please call patient: I have reviewed his/her lab results. MRI report shows DJD / arthritic changes but no nerve compression to explain weakness. I recommend referral to neurology for further evaluation and testing. Referral placed.

## 2017-09-07 ENCOUNTER — Ambulatory Visit: Payer: PRIVATE HEALTH INSURANCE | Admitting: Diagnostic Neuroimaging

## 2017-09-07 ENCOUNTER — Encounter: Payer: Self-pay | Admitting: Diagnostic Neuroimaging

## 2017-09-07 VITALS — BP 136/100 | HR 104 | Ht <= 58 in | Wt 204.8 lb

## 2017-09-07 DIAGNOSIS — R29898 Other symptoms and signs involving the musculoskeletal system: Secondary | ICD-10-CM

## 2017-09-07 DIAGNOSIS — M6281 Muscle weakness (generalized): Secondary | ICD-10-CM | POA: Diagnosis not present

## 2017-09-07 NOTE — Progress Notes (Signed)
[facility]  PATIENT: Sandra Lara DOB: Nov 16, 1960  REFERRING CLINICIAN: Cathlean Marseilles HISTORY FROM: patient  REASON FOR VISIT: new consult    HISTORICAL  CHIEF COMPLAINT:  Chief Complaint  Patient presents with  . NP  Dr. Billey Chang    started end of may   . R leg weakness    R thigh weakness (hard to climb stairs).    HISTORY OF PRESENT ILLNESS:   57 year old female here for evaluation of right leg weakness.  End of May 2019 patient had sudden onset of right thigh and hip weakness.  She noticed difficulty standing up onto steps with her right leg.  No significant pain or numbness.  Patient was evaluated by PCP, had MRI of the lumbar spine and was treated with prednisone Dosepak.  Since that time symptoms have improved although not returned yet to baseline.  Patient has some chronic right IT band, hip and thigh pain and tightness problems.  No problems with her left leg.  No problems with her hands or arms.  No neck pain.  No numbness in face.   REVIEW OF SYSTEMS: Full 14 system review of systems performed and negative with exception of: Snoring CPAP ringing in ears.  ALLERGIES: Allergies  Allergen Reactions  . Sulfamethoxazole-Trimethoprim Anaphylaxis  . Doxycycline Rash    sunburn type rash, sensitivity not allergy  . Erythromycin Other (See Comments)    Oral blisters Oral blisters  . Achromycin [Tetracycline]   . Macrolides And Ketolides     HOME MEDICATIONS: Outpatient Medications Prior to Visit  Medication Sig Dispense Refill  . albuterol (PROVENTIL HFA;VENTOLIN HFA) 108 (90 Base) MCG/ACT inhaler Inhale 2 puffs into the lungs every 4 (four) hours as needed. 1 Inhaler 0  . Ascorbic Acid (VITAMIN C) 100 MG tablet Take 100 mg by mouth daily.    . Azelastine HCl 137 MCG/SPRAY SOLN USE 1 SPRAY IN EACH NOSTRIL 2 TIMES DAILY AS DIRECTED  12  . Cholecalciferol (VITAMIN D3) 5000 units TABS Take by mouth daily.    . cyclobenzaprine (FLEXERIL) 5  MG tablet Take one tablet (5 mg dose) by mouth 3 (three) times a day as needed for Muscle spasms for up to 10 days.  0  . Multiple Vitamins-Minerals (THERA-M) TABS Take 1 tablet by mouth daily.    Marland Kitchen omega-3 acid ethyl esters (LOVAZA) 1 G capsule Take by mouth 2 (two) times daily.    Marland Kitchen omeprazole (PRILOSEC) 20 MG capsule Take 1 capsule by mouth daily.    Marland Kitchen SACCHAROMYCES BOULARDII PO Take by mouth.    . sodium chloride (OCEAN) 0.65 % SOLN nasal spray Place 2 sprays into both nostrils every 2 (two) hours while awake.  0  . fluticasone (FLONASE) 50 MCG/ACT nasal spray Place 1 spray into both nostrils 2 (two) times daily. (Patient not taking: Reported on 08/26/2017) 16 g 6  . oxymetazoline (AFRIN) 0.05 % nasal spray Place 1 spray into both nostrils 2 (two) times daily.    . predniSONE (DELTASONE) 20 MG tablet Take 60 mg by mouth daily.  0   No facility-administered medications prior to visit.     PAST MEDICAL HISTORY: Past Medical History:  Diagnosis Date  . Allergy   . Diverticulitis   . Diverticulitis   . Family history of polyps in the colon   . GERD (gastroesophageal reflux disease)    Heartburn  . Hyperlipidemia   . OSA on CPAP     PAST SURGICAL HISTORY: Past Surgical History:  Procedure Laterality Date  . BONE GRAFT HIP ILIAC CREST     Hip to Right ulna  . CHOLECYSTECTOMY  2005  . colon polyps      Removed in 2018  . GALLBLADDER SURGERY  2005  . prolapse     Mitral Valve diagnosed in 1981  . WRIST FRACTURE SURGERY Right     FAMILY HISTORY: Family History  Problem Relation Age of Onset  . Breast cancer Mother 14  . Arthritis Mother   . Cancer Mother   . COPD Mother        IPF  . Hearing loss Mother   . Hyperlipidemia Mother   . Heart attack Brother   . Stroke Maternal Grandmother   . Diabetes Maternal Grandfather   . Heart attack Maternal Grandfather     SOCIAL HISTORY:  Social History   Socioeconomic History  . Marital status: Married    Spouse name: Not  on file  . Number of children: Not on file  . Years of education: Not on file  . Highest education level: Not on file  Occupational History  . Not on file  Social Needs  . Financial resource strain: Not on file  . Food insecurity:    Worry: Not on file    Inability: Not on file  . Transportation needs:    Medical: Not on file    Non-medical: Not on file  Tobacco Use  . Smoking status: Former Smoker    Last attempt to quit: 03/09/2012    Years since quitting: 5.5  . Smokeless tobacco: Never Used  Substance and Sexual Activity  . Alcohol use: Yes    Alcohol/week: 4.2 oz    Types: 7 Standard drinks or equivalent per week    Comment: one daily  . Drug use: No  . Sexual activity: Not Currently  Lifestyle  . Physical activity:    Days per week: Not on file    Minutes per session: Not on file  . Stress: Not on file  Relationships  . Social connections:    Talks on phone: Not on file    Gets together: Not on file    Attends religious service: Not on file    Active member of club or organization: Not on file    Attends meetings of clubs or organizations: Not on file    Relationship status: Not on file  . Intimate partner violence:    Fear of current or ex partner: Not on file    Emotionally abused: Not on file    Physically abused: Not on file    Forced sexual activity: Not on file  Other Topics Concern  . Not on file  Social History Narrative   Lives home with spouse, Forde Radon, and 2 dosgs.  Education AD.  [employer]  No children.  Caffeine 2 cups daily.     PHYSICAL EXAM  GENERAL EXAM/CONSTITUTIONAL: Vitals:  Vitals:   09/07/17 1528 09/07/17 1536  BP: (!) 144/102 (!) 136/100  Pulse: (!) 104   SpO2: 98%   Weight: 204 lb 12.8 oz (92.9 kg)   Height: 4' 9.5" (1.461 m)      Body mass index is 43.55 kg/m.  Visual Acuity Screening   Right eye Left eye Both eyes  Without correction:     With correction: 20/20 20/30      Patient is in no distress; well  developed, nourished and groomed; neck is supple  CARDIOVASCULAR:  Examination of carotid arteries is normal;  no carotid bruits  Regular rate and rhythm, no murmurs  Examination of peripheral vascular system by observation and palpation is normal  EYES:  Ophthalmoscopic exam of optic discs and posterior segments is normal; no papilledema or hemorrhages  MUSCULOSKELETAL:  Gait, strength, tone, movements noted in Neurologic exam below  NEUROLOGIC: MENTAL STATUS:  No flowsheet data found.  awake, alert, oriented to person, place and time  recent and remote memory intact  normal attention and concentration  language fluent, comprehension intact, naming intact,   fund of knowledge appropriate  CRANIAL NERVE:   2nd - no papilledema on fundoscopic exam  2nd, 3rd, 4th, 6th - pupils equal and reactive to light, visual fields full to confrontation, extraocular muscles intact, no nystagmus  5th - facial sensation symmetric  7th - facial strength symmetric  8th - hearing intact  9th - palate elevates symmetrically, uvula midline  11th - shoulder shrug symmetric  12th - tongue protrusion midline  MOTOR:   normal bulk and tone, full strength in the BUE, BLE; EXCEPT RIGHT HIP FLEXION 4 AND RIGHT KNEE EXT 4  SENSORY:   normal and symmetric to light touch, pinprick, temperature, vibration  COORDINATION:   finger-nose-finger, fine finger movements normal  REFLEXES:   deep tendon reflexes present and symmetric; SLIGHTLY BRISK IN KNEES (BUT INCONSISTENT)  GAIT/STATION:   narrow based gait; able to walk on toes, heels    DIAGNOSTIC DATA (LABS, IMAGING, TESTING) - I reviewed patient records, labs, notes, testing and imaging myself where available.  No results found for: WBC, HGB, HCT, MCV, PLT No results found for: NA, K, CL, CO2, GLUCOSE, BUN, CREATININE, CALCIUM, PROT, ALBUMIN, AST, ALT, ALKPHOS, BILITOT, GFRNONAA, GFRAA No results found for: CHOL, HDL,  LDLCALC, LDLDIRECT, TRIG, CHOLHDL No results found for: HGBA1C No results found for: VITAMINB12 No results found for: TSH   08/31/17 MRI lumbar spine [I reviewed images myself and agree with interpretation. There is mild spinal stenosis at L5-S1. -VRP]  - S1 is described as a transitional vertebra. - Degenerative disc disease at L3-4, L4-5 and L5-S1. No compressive central canal stenosis. Mild lateral recess and foraminal narrowing more notable on the left at L3-4 and L5-S1. At no level is definite neural compression is stab list. In particular, in this patient with right leg weakness, I do not see a clear explanation for right-sided symptoms. - Shallow disc herniation at T12-L1 indents the thecal sac but does not appear to cause neural compression     ASSESSMENT AND PLAN  57 y.o. year old female here with sudden onset right thigh and leg weakness in May 2019, gradually improving.  Could represent peripheral neuropathy, lumbar radiculopathy, now improving.  However sudden onset of symptoms raises possibility of vascular event such as stroke.  Will check MRI of the brain to rule out other causes.   Ddx: stroke vs peripheral neuropathy vs myopathy  1. Right leg weakness   2. Muscle weakness (generalized)      PLAN:  - MRI brain (rule out stroke) - optimize nutrition and exercise / flexibility - follow up BP  Orders Placed This Encounter  Procedures  . MR BRAIN WO CONTRAST   Return if symptoms worsen or fail to improve. pending test results     Penni Bombard, MD 08/10/8754, 4:33 PM Certified in Neurology, Neurophysiology and Neuroimaging  [facility] [address], [place], [place] [address] [phone]

## 2017-09-07 NOTE — Patient Instructions (Signed)
-   MRI brain (rule out stroke)  - optimize nutrition and exercise / flexibility  - follow up BP at home

## 2017-09-13 ENCOUNTER — Telehealth: Payer: Self-pay | Admitting: Diagnostic Neuroimaging

## 2017-09-13 NOTE — Telephone Encounter (Signed)
[employer] order sent to GI they obtain the auth and will reach out to the pt to schedule.

## 2017-09-16 ENCOUNTER — Ambulatory Visit
Admission: RE | Admit: 2017-09-16 | Discharge: 2017-09-16 | Disposition: A | Discharge status: Home / Self Care | Payer: PRIVATE HEALTH INSURANCE | Source: Ambulatory Visit | Attending: Diagnostic Neuroimaging | Admitting: Diagnostic Neuroimaging

## 2017-09-16 DIAGNOSIS — M6281 Muscle weakness (generalized): Secondary | ICD-10-CM | POA: Diagnosis not present

## 2017-09-22 ENCOUNTER — Telehealth: Payer: Self-pay | Admitting: *Deleted

## 2017-09-22 NOTE — Telephone Encounter (Signed)
Pt returning RN's call.

## 2017-09-22 NOTE — Telephone Encounter (Signed)
-----   Message from Penni Bombard, MD sent at 09/20/2017  3:08 PM EDT ----- Unremarkable imaging results. Few non-specific spots of scar tissue. No major findings. Please call patient. Continue current plan. -VRP

## 2017-09-22 NOTE — Telephone Encounter (Signed)
I spoke to pt and relayed that MRI brain results were unremarkable.  No major findings.  Few non specific spots of scar tissue per Dr. Leta Baptist.  Pt verbalized understanding. Continue current plan.  Leg weakness improving per pt.  She will call us prn.

## 2017-09-22 NOTE — Telephone Encounter (Signed)
LMVM for pt to return call for her MRI results.

## 2017-09-28 ENCOUNTER — Ambulatory Visit: Payer: Self-pay | Admitting: Registered Nurse

## 2017-09-28 ENCOUNTER — Encounter: Payer: Self-pay | Admitting: Registered Nurse

## 2017-09-28 VITALS — BP 146/82 | HR 75 | Temp 97.8°F

## 2017-09-28 DIAGNOSIS — J0101 Acute recurrent maxillary sinusitis: Secondary | ICD-10-CM

## 2017-09-28 MED ORDER — ACETAMINOPHEN 500 MG PO TABS: 1000.0000 mg | ORAL_TABLET | Freq: Four times a day (QID) | ORAL | 0 refills | Status: AC | PRN

## 2017-09-28 MED ORDER — AMOXICILLIN 875 MG PO TABS: 875.0000 mg | ORAL_TABLET | Freq: Two times a day (BID) | ORAL | 0 refills | Status: DC

## 2017-09-28 MED ORDER — FLUTICASONE PROPIONATE 50 MCG/ACT NA SUSP: 1.0000 | Freq: Two times a day (BID) | NASAL | 6 refills | Status: DC

## 2017-09-28 NOTE — Progress Notes (Signed)
Subjective:    Patient ID: Sandra Lara, female    DOB: May 07, 1960, 57 y.o.   MRN: [id number]  57y/o Caucasian established female pt c/o R sided sore throat that she awoke with this morning. She noted a white spot on R tonsil and "holes" in it this morning. Productive cough with clear phlegm for past few days. Frequent throat clearing noted in clinic. +rhinorrhea, +R maxillary sinus pain/pressure.  Right maxillary sinus/cheek most pain pressure  Only taking Azelastine nasal spray, no OTCs. Reports only remote Hx of strep throat as a child, none as adult. Sts bronchitis usually once yearly. Feb 2019 bronchitis required prednisone, flonase, azelastine, saline and promethazine liquid.  Hasn't been using flonase and rare use nasal saline. + sick contacts at work  Doesn't tolerate sudafed or phenylephrine feels like ants crawling on skin     Review of Systems  Constitutional: Negative for activity change, appetite change, chills, diaphoresis, fatigue, fever and unexpected weight change.  HENT: Positive for congestion, postnasal drip, rhinorrhea, sinus pressure, sinus pain, sneezing and sore throat. Negative for dental problem, drooling, ear discharge, ear pain, facial swelling, hearing loss, mouth sores, nosebleeds, tinnitus, trouble swallowing and voice change.   Eyes: Negative for photophobia, pain, discharge, redness, itching and visual disturbance.  Respiratory: Positive for cough. Negative for choking, chest tightness, shortness of breath, wheezing and stridor.   Cardiovascular: Negative for chest pain, palpitations and leg swelling.  Gastrointestinal: Negative for abdominal distention, abdominal pain, blood in stool, constipation, diarrhea, nausea and vomiting.  Endocrine: Negative for cold intolerance and heat intolerance.  Genitourinary: Negative for difficulty urinating, dysuria and hematuria.  Musculoskeletal: Negative for arthralgias, back pain, gait problem, joint swelling, myalgias,  neck pain and neck stiffness.  Skin: Negative for color change, pallor, rash and wound.  Allergic/Immunologic: Positive for environmental allergies. Negative for food allergies.  Neurological: Negative for dizziness, tremors, seizures, syncope, facial asymmetry, speech difficulty, weakness, light-headedness, numbness and headaches.  Hematological: Negative for adenopathy. Does not bruise/bleed easily.  Psychiatric/Behavioral: Negative for agitation, behavioral problems, confusion and sleep disturbance.       Objective:   Physical Exam  Constitutional: She is oriented to person, place, and time. Vital signs are normal. She appears well-developed and well-nourished. She is active and cooperative.  Non-toxic appearance. She does not have a sickly appearance. She appears ill. No distress.  HENT:  Head: Normocephalic and atraumatic.  Right Ear: Hearing, external ear and ear canal normal. No drainage, swelling or tenderness. A middle ear effusion is present.  Left Ear: Hearing, external ear and ear canal normal. No drainage, swelling or tenderness. A middle ear effusion is present.  Nose: Mucosal edema and rhinorrhea present. No nose lacerations, sinus tenderness, nasal deformity, septal deviation or nasal septal hematoma. No epistaxis.  No foreign bodies. Right sinus exhibits maxillary sinus tenderness and frontal sinus tenderness. Left sinus exhibits maxillary sinus tenderness and frontal sinus tenderness.  Mouth/Throat: Uvula is midline and mucous membranes are normal. Mucous membranes are not pale, not dry and not cyanotic. She does not have dentures. No oral lesions. No trismus in the jaw. Normal dentition. No dental abscesses, uvula swelling, lacerations or dental caries. Posterior oropharyngeal edema and posterior oropharyngeal erythema present. No oropharyngeal exudate or tonsillar abscesses. No tonsillar exudate.  Bilateral allergic shiners; nasal turbinates edema/erythema clear discharge;  bilateral TMs air fluid level clear; frequent nose/throat clearing in exam room/clinic; macular erythema oropharynx; cobblestoning posterior pharynx  Eyes: Pupils are equal, round, and reactive to light. Conjunctivae, EOM  and lids are normal. Right eye exhibits no chemosis, no discharge, no exudate and no hordeolum. No foreign body present in the right eye. Left eye exhibits no chemosis, no discharge, no exudate and no hordeolum. No foreign body present in the left eye. Right conjunctiva is not injected. Right conjunctiva has no hemorrhage. Left conjunctiva is not injected. Left conjunctiva has no hemorrhage. No scleral icterus. Right eye exhibits normal extraocular motion and no nystagmus. Left eye exhibits normal extraocular motion and no nystagmus. Right pupil is round and reactive. Left pupil is round and reactive. Pupils are equal.  Neck: Trachea normal, normal range of motion and phonation normal. Neck supple. No tracheal tenderness, no spinous process tenderness and no muscular tenderness present. No neck rigidity. No tracheal deviation, no edema, no erythema and normal range of motion present. No thyroid mass and no thyromegaly present.  Cardiovascular: Normal rate, regular rhythm, S1 normal, S2 normal, normal heart sounds and intact distal pulses. PMI is not displaced. Exam reveals no gallop and no friction rub.  No murmur heard. Pulmonary/Chest: Effort normal and breath sounds normal. No accessory muscle usage or stridor. No respiratory distress. She has no decreased breath sounds. She has no wheezes. She has no rhonchi. She has no rales. She exhibits no tenderness.  Mild nonproductive cough in clinic; spoke full sentences without difficulty  Abdominal: Soft. Normal appearance. She exhibits no distension and no fluid wave. There is no rigidity and no guarding.  Musculoskeletal: Normal range of motion. She exhibits no edema or tenderness.       Right shoulder: Normal.       Left shoulder: Normal.        Right elbow: Normal.      Left elbow: Normal.       Right hip: Normal.       Left hip: Normal.       Right knee: Normal.       Left knee: Normal.       Cervical back: Normal.       Thoracic back: Normal.       Lumbar back: Normal.       Right hand: Normal.       Left hand: Normal.  Lymphadenopathy:       Head (right side): No submental, no submandibular, no tonsillar, no preauricular, no posterior auricular and no occipital adenopathy present.       Head (left side): No submental, no submandibular, no tonsillar, no preauricular, no posterior auricular and no occipital adenopathy present.    She has no cervical adenopathy.       Right cervical: No superficial cervical, no deep cervical and no posterior cervical adenopathy present.      Left cervical: No superficial cervical, no deep cervical and no posterior cervical adenopathy present.  Neurological: She is alert and oriented to person, place, and time. She has normal strength. She is not disoriented. She displays no atrophy and no tremor. No cranial nerve deficit or sensory deficit. She exhibits normal muscle tone. She displays no seizure activity. Coordination and gait normal. GCS eye subscore is 4. GCS verbal subscore is 5. GCS motor subscore is 6.  In/ouf of chair and on/off exam table without difficulty; gait sure and steady in hallway  Skin: Skin is warm, dry and intact. Capillary refill takes less than 2 seconds. No abrasion, no bruising, no burn, no ecchymosis, no laceration, no lesion, no petechiae and no rash noted. She is not diaphoretic. No cyanosis or erythema. No pallor.  Nails show no clubbing.  Psychiatric: She has a normal mood and affect. Her speech is normal and behavior is normal. Judgment and thought content normal. She is not actively hallucinating. Cognition and memory are normal. She is attentive.  Nursing note and vitals reviewed.         Assessment & Plan:  A-recurrent acute maxillary sinusitis  P-need  to dry up post nasal drip from sinusitis/rhinitis then cough should resolve.  Has albuterol inhaler at home if needed for prn use.  Given cough drop po q2h prn cough from clinic stock 8 UD.  Continue azelastine nasal saline spray as prescribed.  Use nasal saline first in shower and at work given 1 bottle from clinic stock.  Restart flonase 1 spray each nostril BID #1 RF6 electronic Rx to her pharmacy of choice, saline 2 sprays each nostril q2h wa prn congestion.  If no improvement with 48 hours of saline and flonase use start amoxicillin 875mg  po BID x 10 days #20 RF0 dispensed from [facility] to patient.  Denied personal or family history of ENT cancer.  Shower BID especially prior to bed. No evidence of systemic bacterial infection, non toxic and well hydrated.  I do not see where any further testing or imaging is necessary at this time.   I will suggest supportive care, rest, good hygiene and encourage the patient to take adequate fluids.  The patient is to return to clinic or EMERGENCY ROOM if symptoms worsen or change significantly.  Exitcare handout on sinusitis and sinus rinse .  Patient verbalized agreement and understanding of treatment plan and had no further questions at this time.   P2:  Hand washing and cover cough

## 2017-09-28 NOTE — Patient Instructions (Addendum)
Nonallergic Rhinitis  1. Nonallergic rhinitis is a term used by allergist to describe inflammation in the nose that is not due to an allergic source (i.e., pollens, mold, animal dander or dust mites). 2. Nonallergic rhinitis can mimic many of the symptoms caused by allergies.  This includes a runny nose ("rhinorrhea"), sneezing, congestion, ear fullness and post nasal drip. 3. These symptoms usually occur year round but can be made worse by many environmental factors including weather change, irritants such as cigarette smoke, fumes, perfumes, detergents and many others. These are not allergens, but are irritants, and do not cause the formation of antibodies like true allergens.  For this reason most people with nonallergic rhinitis have negative skin tests. 4. Unfortunately, we have a poor understanding of what causes nonallergic rhinitis but certain factors such as deviated septum, sinusitis and nasal polyps may contribute to the symptoms.  Due to the poor understanding of what causes nonallergic rhinitis it cannot be cured and our treatment is only symptomatic to control symptoms. 5. Important factors in the successful treatment of nonallergic rhinitis include avoidance of the irritants that cause symptoms; for example, people who continue to smoke may never improve.  Continuous therapy works better than intermittent.  This may mean taking medications once daily or 3-4 times a day. Helpful treatments include nasal saline lavage, nasal ipratropium (Atrovent), nasal steroids, and decongestants.  Pure antihistamines don't seem to work very well.  Immunotherapy (allergy shots) has no role in the treatment of nonallergic rhinitis.  Several medications may have to be tried before a good combination is found for you.  These medications, in general, are safe for long term use.  Tolerance may develop to the therapeutic effects of these medications: Frequently changing between several helpful medications can prevent  this should it occur.Allergic Rhinitis, Adult Allergic rhinitis is an allergic reaction that affects the mucous membrane inside the nose. It causes sneezing, a runny or stuffy nose, and the feeling of mucus going down the back of the throat (postnasal drip). Allergic rhinitis can be mild to severe. There are two types of allergic rhinitis:  Seasonal. This type is also called hay fever. It happens only during certain seasons.  Perennial. This type can happen at any time of the year.  What are the causes? This condition happens when the body's defense system (immune system) responds to certain harmless substances called allergens as though they were germs.  Seasonal allergic rhinitis is triggered by pollen, which can come from grasses, trees, and weeds. Perennial allergic rhinitis may be caused by:  House dust mites.  Pet dander.  Mold spores.  What are the signs or symptoms? Symptoms of this condition include:  Sneezing.  Runny or stuffy nose (nasal congestion).  Postnasal drip.  Itchy nose.  Tearing of the eyes.  Trouble sleeping.  Daytime sleepiness.  How is this diagnosed? This condition may be diagnosed based on:  Your medical history.  A physical exam.  Tests to check for related conditions, such as: ? Asthma. ? Pink eye. ? Ear infection. ? Upper respiratory infection.  Tests to find out which allergens trigger your symptoms. These may include skin or blood tests.  How is this treated? There is no cure for this condition, but treatment can help control symptoms. Treatment may include:  Taking medicines that block allergy symptoms, such as antihistamines. Medicine may be given as a shot, nasal spray, or pill.  Avoiding the allergen.  Desensitization. This treatment involves getting ongoing shots until your body  becomes less sensitive to the allergen. This treatment may be done if other treatments do not help.  If taking medicine and avoiding the allergen  does not work, new, stronger medicines may be prescribed.  Follow these instructions at home:  Find out what you are allergic to. Common allergens include smoke, dust, and pollen.  Avoid the things you are allergic to. These are some things you can do to help avoid allergens: ? Replace carpet with wood, tile, or vinyl flooring. Carpet can trap dander and dust. ? Do not smoke. Do not allow smoking in your home. ? Change your heating and air conditioning filter at least once a month. ? During allergy season:  Keep windows closed as much as possible.  Plan outdoor activities when pollen counts are lowest. This is usually during the evening hours.  When coming indoors, change clothing and shower before sitting on furniture or bedding.  Take over-the-counter and prescription medicines only as told by your health care provider.  Keep all follow-up visits as told by your health care provider. This is important. Contact a health care provider if:  You have a fever.  You develop a persistent cough.  You make whistling sounds when you breathe (you wheeze).  Your symptoms interfere with your normal daily activities. Get help right away if:  You have shortness of breath. Summary  This condition can be managed by taking medicines as directed and avoiding allergens.  Contact your health care provider if you develop a persistent cough or fever.  During allergy season, keep windows closed as much as possible. This information is not intended to replace advice given to you by your health care provider. Make sure you discuss any questions you have with your health care provider. Document Released: 11/18/2000 Document Revised: 04/02/2016 Document Reviewed: 04/02/2016 Elsevier Interactive Patient Education  2018 [employer]. Sinusitis, Adult Sinusitis is soreness and inflammation of your sinuses. Sinuses are hollow spaces in the bones around your face. Your sinuses are located:  Around  your eyes.  In the middle of your forehead.  Behind your nose.  In your cheekbones.  Your sinuses and nasal passages are lined with a stringy fluid (mucus). Mucus normally drains out of your sinuses. When your nasal tissues become inflamed or swollen, the mucus can become trapped or blocked so air cannot flow through your sinuses. This allows bacteria, viruses, and funguses to grow, which leads to infection. Sinusitis can develop quickly and last for 7?10 days (acute) or for more than 12 weeks (chronic). Sinusitis often develops after a cold. What are the causes? This condition is caused by anything that creates swelling in the sinuses or stops mucus from draining, including:  Allergies.  Asthma.  Bacterial or viral infection.  Abnormally shaped bones between the nasal passages.  Nasal growths that contain mucus (nasal polyps).  Narrow sinus openings.  Pollutants, such as chemicals or irritants in the air.  A foreign object stuck in the nose.  A fungal infection. This is rare.  What increases the risk? The following factors may make you more likely to develop this condition:  Having allergies or asthma.  Having had a recent cold or respiratory tract infection.  Having structural deformities or blockages in your nose or sinuses.  Having a weak immune system.  Doing a lot of swimming or diving.  Overusing nasal sprays.  Smoking.  What are the signs or symptoms? The main symptoms of this condition are pain and a feeling of pressure around the  affected sinuses. Other symptoms include:  Upper toothache.  Earache.  Headache.  Bad breath.  Decreased sense of smell and taste.  A cough that may get worse at night.  Fatigue.  Fever.  Thick drainage from your nose. The drainage is often green and it may contain pus (purulent).  Stuffy nose or congestion.  Postnasal drip. This is when extra mucus collects in the throat or back of the nose.  Swelling and  warmth over the affected sinuses.  Sore throat.  Sensitivity to light.  How is this diagnosed? This condition is diagnosed based on symptoms, a medical history, and a physical exam. To find out if your condition is acute or chronic, your health care provider may:  Look in your nose for signs of nasal polyps.  Tap over the affected sinus to check for signs of infection.  View the inside of your sinuses using an imaging device that has a light attached (endoscope).  If your health care provider suspects that you have chronic sinusitis, you may also:  Be tested for allergies.  Have a sample of mucus taken from your nose (nasal culture) and checked for bacteria.  Have a mucus sample examined to see if your sinusitis is related to an allergy.  If your sinusitis does not respond to treatment and it lasts longer than 8 weeks, you may have an MRI or CT scan to check your sinuses. These scans also help to determine how severe your infection is. In rare cases, a bone biopsy may be done to rule out more serious types of fungal sinus disease. How is this treated? Treatment for sinusitis depends on the cause and whether your condition is chronic or acute. If a virus is causing your sinusitis, your symptoms will go away on their own within 10 days. You may be given medicines to relieve your symptoms, including:  Topical nasal decongestants. They shrink swollen nasal passages and let mucus drain from your sinuses.  Antihistamines. These drugs block inflammation that is triggered by allergies. This can help to ease swelling in your nose and sinuses.  Topical nasal corticosteroids. These are nasal sprays that ease inflammation and swelling in your nose and sinuses.  Nasal saline washes. These rinses can help to get rid of thick mucus in your nose.  If your condition is caused by bacteria, you will be given an antibiotic medicine. If your condition is caused by a fungus, you will be given an  antifungal medicine. Surgery may be needed to correct underlying conditions, such as narrow nasal passages. Surgery may also be needed to remove polyps. Follow these instructions at home: Medicines  Take, use, or apply over-the-counter and prescription medicines only as told by your health care provider. These may include nasal sprays.  If you were prescribed an antibiotic medicine, take it as told by your health care provider. Do not stop taking the antibiotic even if you start to feel better. Hydrate and Humidify  Drink enough water to keep your urine clear or pale yellow. Staying hydrated will help to thin your mucus.  Use a cool mist humidifier to keep the humidity level in your home above 50%.  Inhale steam for 10-15 minutes, 3-4 times a day or as told by your health care provider. You can do this in the bathroom while a hot shower is running.  Limit your exposure to cool or dry air. Rest  Rest as much as possible.  Sleep with your head raised (elevated).  Make sure to  get enough sleep each night. General instructions  Apply a warm, moist washcloth to your face 3-4 times a day or as told by your health care provider. This will help with discomfort.  Wash your hands often with soap and water to reduce your exposure to viruses and other germs. If soap and water are not available, use hand sanitizer.  Do not smoke. Avoid being around people who are smoking (secondhand smoke).  Keep all follow-up visits as told by your health care provider. This is important. Contact a health care provider if:  You have a fever.  Your symptoms get worse.  Your symptoms do not improve within 10 days. Get help right away if:  You have a severe headache.  You have persistent vomiting.  You have pain or swelling around your face or eyes.  You have vision problems.  You develop confusion.  Your neck is stiff.  You have trouble breathing. This information is not intended to replace  advice given to you by your health care provider. Make sure you discuss any questions you have with your health care provider. Document Released: 02/23/2005 Document Revised: 10/20/2015 Document Reviewed: 12/19/2014 Elsevier Interactive Patient Education  2018 [employer]. Sinus Rinse What is a sinus rinse? A sinus rinse is a simple home treatment that is used to rinse your sinuses with a sterile mixture of salt and water (saline solution). Sinuses are air-filled spaces in your skull behind the bones of your face and forehead that open into your nasal cavity. You will use the following:  Saline solution.  Neti pot or spray bottle. This releases the saline solution into your nose and through your sinuses. Neti pots and spray bottles can be purchased at [employer], a health food store, or online.  When would I do a sinus rinse? A sinus rinse can help to clear mucus, dirt, dust, or pollen from the nasal cavity. You may do a sinus rinse when you have a cold, a virus, nasal allergy symptoms, a sinus infection, or stuffiness in the nose or sinuses. If you are considering a sinus rinse:  Ask your child's health care provider before performing a sinus rinse on your child.  Do not do a sinus rinse if you have had ear or nasal surgery, ear infection, or blocked ears.  How do I do a sinus rinse?  Wash your hands.  Disinfect your device according to the directions provided and then dry it.  Use the solution that comes with your device or one that is sold separately in stores. Follow the mixing directions on the package.  Fill your device with the amount of saline solution as directed by the device instructions.  Stand over a sink and tilt your head sideways over the sink.  Place the spout of the device in your upper nostril (the one closer to the ceiling).  Gently pour or squeeze the saline solution into the nasal cavity. The liquid should drain to the lower nostril if you are not  overly congested.  Gently blow your nose. Blowing too hard may cause ear pain.  Repeat in the other nostril.  Clean and rinse your device with clean water and then air-dry it. Are there risks of a sinus rinse? Sinus rinse is generally very safe and effective. However, there are a few risks, which include:  A burning sensation in the sinuses. This may happen if you do not make the saline solution as directed. Make sure to follow all directions when making  the saline solution.  Infection from contaminated water. This is rare, but possible.  Nasal irritation.  This information is not intended to replace advice given to you by your health care provider. Make sure you discuss any questions you have with your health care provider. Document Released: 09/20/2013 Document Revised: 01/21/2016 Document Reviewed: 07/11/2013 Elsevier Interactive Patient Education  2017 [employer]  Pharyngitis Pharyngitis is redness, pain, and swelling (inflammation) of the throat (pharynx). It is a very common cause of sore throat. Pharyngitis can be caused by a bacteria, but it is usually caused by a virus. Most cases of pharyngitis get better on their own without treatment. What are the causes? This condition may be caused by:  Infection by viruses (viral). Viral pharyngitis spreads from person to person (is contagious) through coughing, sneezing, and sharing of personal items or utensils such as cups, forks, spoons, and toothbrushes.  Infection by bacteria (bacterial). Bacterial pharyngitis may be spread by touching the nose or face after coming in contact with the bacteria, or through more intimate contact, such as kissing.  Allergies. Allergies can cause buildup of mucus in the throat (post-nasal drip), leading to inflammation and irritation. Allergies can also cause blocked nasal passages, forcing breathing through the mouth, which dries and irritates the throat.  What increases the risk? You are more  likely to develop this condition if:  You are 74-36 years old.  You are exposed to crowded environments such as daycare, school, or dormitory living.  You live in a cold climate.  You have a weakened disease-fighting (immune) system.  What are the signs or symptoms? Symptoms of this condition vary by the cause (viral, bacterial, or allergies) and can include:  Sore throat.  Fatigue.  Low-grade fever.  Headache.  Joint pain and muscle aches.  Skin rashes.  Swollen glands in the throat (lymph nodes).  Plaque-like film on the throat or tonsils. This is often a symptom of bacterial pharyngitis.  Vomiting.  Stuffy nose (nasal congestion).  Cough.  Red, itchy eyes (conjunctivitis).  Loss of appetite.  How is this diagnosed? This condition is often diagnosed based on your medical history and a physical exam. Your health care provider will ask you questions about your illness and your symptoms. A swab of your throat may be done to check for bacteria (rapid strep test). Other lab tests may also be done, depending on the suspected cause, but these are rare. How is this treated? This condition usually gets better in 3-4 days without medicine. Bacterial pharyngitis may be treated with antibiotic medicines. Follow these instructions at home:  Take over-the-counter and prescription medicines only as told by your health care provider. ? If you were prescribed an antibiotic medicine, take it as told by your health care provider. Do not stop taking the antibiotic even if you start to feel better. ? Do not give children aspirin because of the association with Reye syndrome.  Drink enough water and fluids to keep your urine clear or pale yellow.  Get a lot of rest.  Gargle with a salt-water mixture 3-4 times a day or as needed. To make a salt-water mixture, completely dissolve -1 tsp of salt in 1 cup of warm water.  If your health care provider approves, you may use throat  lozenges or sprays to soothe your throat. Contact a health care provider if:  You have large, tender lumps in your neck.  You have a rash.  You cough up green, yellow-brown, or bloody spit. Get help  right away if:  Your neck becomes stiff.  You drool or are unable to swallow liquids.  You cannot drink or take medicines without vomiting.  You have severe pain that does not go away, even after you take medicine.  You have trouble breathing, and it is not caused by a stuffy nose.  You have new pain and swelling in your joints such as the knees, ankles, wrists, or elbows. Summary  Pharyngitis is redness, pain, and swelling (inflammation) of the throat (pharynx).  While pharyngitis can be caused by a bacteria, the most common causes are viral.  Most cases of pharyngitis get better on their own without treatment.  Bacterial pharyngitis is treated with antibiotic medicines. This information is not intended to replace advice given to you by your health care provider. Make sure you discuss any questions you have with your health care provider. Document Released: 02/23/2005 Document Revised: 03/31/2016 Document Reviewed: 03/31/2016 Elsevier Interactive Patient Education  [employer].

## 2017-10-06 ENCOUNTER — Other Ambulatory Visit: Payer: Self-pay

## 2017-10-06 ENCOUNTER — Ambulatory Visit: Payer: PRIVATE HEALTH INSURANCE | Admitting: Family Medicine

## 2017-10-06 ENCOUNTER — Encounter: Payer: Self-pay | Admitting: Family Medicine

## 2017-10-06 VITALS — BP 138/82 | HR 83 | Temp 98.3°F | Ht <= 58 in | Wt 205.4 lb

## 2017-10-06 DIAGNOSIS — M654 Radial styloid tenosynovitis [de Quervain]: Secondary | ICD-10-CM | POA: Diagnosis not present

## 2017-10-06 MED ORDER — DICLOFENAC SODIUM 75 MG PO TBEC: 75.0000 mg | DELAYED_RELEASE_TABLET | Freq: Two times a day (BID) | ORAL | 0 refills | Status: DC

## 2017-10-06 NOTE — Progress Notes (Signed)
Subjective  CC:  Chief Complaint  Patient presents with  . Wrist Pain    x 3 months, not getting better, Radiates from Thumb    HPI: Sandra Lara is a 57 y.o. female who presents to the office today to address the problems listed above in the chief complaint.   Left wrist pain worse with movement of thumb. Can't move wrist laterally without pain. No injury or obvious overuse. Has been icing. Was treated with nsaids and pred for radiculopathy a few months ago and reports that helped her wrist pain. No swelling or redness.  Assessment  1. De Quervain's tenosynovitis, left      Plan   educated:  RICE, diclofenac, thumb spica x 2 weeks. F/u if not improved.   Follow up: Return if symptoms worsen or fail to improve.   Orders Placed This Encounter  Procedures  . Thumb spica   Meds ordered this encounter  Medications  . diclofenac (VOLTAREN) 75 MG EC tablet    Sig: Take 1 tablet (75 mg total) by mouth 2 (two) times daily.    Dispense:  30 tablet    Refill:  0      I reviewed the patients updated PMH, FH, and SocHx.    Patient Active Problem List   Diagnosis Date Noted  . DJD (degenerative joint disease), lumbar 08/26/2017  . Class 3 severe obesity due to excess calories without serious comorbidity with body mass index (BMI) of 40.0 to 44.9 in adult ([place]) 10/29/2016  . Seasonal allergic rhinitis due to pollen 05/21/2016  . Tubular adenoma of colon 11/26/2015  . Diverticulosis of large intestine 05/24/2015  . Gastroesophageal reflux disease without esophagitis 09/28/2014  . Menopause syndrome 09/28/2014   Current Meds  Medication Sig  . Ascorbic Acid (VITAMIN C) 100 MG tablet Take 100 mg by mouth daily.  . Azelastine HCl 137 MCG/SPRAY SOLN USE 1 SPRAY IN EACH NOSTRIL 2 TIMES DAILY AS DIRECTED  . Cholecalciferol (VITAMIN D3) 5000 units TABS Take by mouth daily.  . Multiple Vitamins-Minerals (THERA-M) TABS Take 1 tablet by mouth daily.  Marland Kitchen omega-3 acid ethyl esters  (LOVAZA) 1 G capsule Take by mouth 2 (two) times daily.  Marland Kitchen omeprazole (PRILOSEC) 20 MG capsule Take 1 capsule by mouth daily.  Marland Kitchen SACCHAROMYCES BOULARDII PO Take by mouth.    Allergies: Patient is allergic to sulfamethoxazole-trimethoprim; doxycycline; erythromycin; achromycin [tetracycline]; and macrolides and ketolides. Family History: Patient family history includes Arthritis in her mother; Breast cancer (age of onset: 54) in her mother; COPD in her mother; Cancer in her mother; Diabetes in her maternal grandfather; Hearing loss in her mother; Heart attack in her brother and maternal grandfather; Hyperlipidemia in her mother; Stroke in her maternal grandmother. Social History:  Patient  reports that she quit smoking about 5 years ago. She has never used smokeless tobacco. She reports that she drinks about 4.2 oz of alcohol per week. She reports that she does not use drugs.  Review of Systems: Constitutional: Negative for fever malaise or anorexia Cardiovascular: negative for chest pain Respiratory: negative for SOB or persistent cough Gastrointestinal: negative for abdominal pain  Objective  Vitals: BP 138/82   Pulse 83   Temp 98.3 F (36.8 C)   Ht 4' 9.5" (1.461 m)   Wt 205 lb 6.4 oz (93.2 kg)   SpO2 98%   BMI 43.68 kg/m  General: no acute distress , A&Ox3 Left wrist: + finklesteins, no bony tenderness, nl mcp and ligaments  Commons side effects, risks, benefits, and alternatives for medications and treatment plan prescribed today were discussed, and the patient expressed understanding of the given instructions. Patient is instructed to call or message via MyChart if he/she has any questions or concerns regarding our treatment plan. No barriers to understanding were identified. We discussed Red Flag symptoms and signs in detail. Patient expressed understanding regarding what to do in case of urgent or emergency type symptoms.   Medication list was reconciled, printed and  provided to the patient in AVS. Patient instructions and summary information was reviewed with the patient as documented in the AVS. This note was prepared with assistance of Dragon voice recognition software. Occasional wrong-word or sound-a-like substitutions may have occurred due to the inherent limitations of voice recognition software

## 2017-10-06 NOTE — Patient Instructions (Signed)
Follow up in November as scheduled. Sooner if needed for wrist.   Wear the immobilizer for the next 2 weeks.  Complete a 2 week course of diclofenac.   F/u if not improving.  De Quervain Tenosynovitis Tendons attach muscles to bones. They also help with joint movements. When tendons become irritated or swollen, it is called tendinitis. The extensor pollicis brevis (EPB) tendon connects the EPB muscle to a bone that is near the base of the thumb. The EPB muscle helps to straighten and extend the thumb. De Quervain tenosynovitis is a condition in which the EPB tendon lining (sheath) becomes irritated, thickened, and swollen. This condition is sometimes called stenosing tenosynovitis. This condition causes pain on the thumb side of the back of the wrist. What are the causes? Causes of this condition include:  Activities that repeatedly cause your thumb and wrist to extend.  A sudden increase in activity or change in activity that affects your wrist.  What increases the risk? This condition is more likely to develop in:  Females.  People who have diabetes.  Women who have recently given birth.  People who are over 74 years of age.  People who do activities that involve repeated hand and wrist motions, such as tennis, racquetball, volleyball, gardening, and taking care of children.  People who do heavy labor.  People who have poor wrist strength and flexibility.  People who do not warm up properly before activities.  What are the signs or symptoms? Symptoms of this condition include:  Pain or tenderness over the thumb side of the back of the wrist when your thumb and wrist are not moving.  Pain that gets worse when you straighten your thumb or extend your thumb or wrist.  Pain when the injured area is touched.  Locking or catching of the thumb joint while you bend and straighten your thumb.  Decreased thumb motion due to pain.  Swelling over the affected area.  How is  this diagnosed? This condition is diagnosed with a medical history and physical exam. Your health care provider will ask for details about your injury and ask about your symptoms. How is this treated? Treatment may include the use of icing and medicines to reduce pain and swelling. You may also be advised to wear a splint or brace to limit your thumb and wrist motion. In less severe cases, treatment may also include working with a physical therapist to strengthen your wrist and calm the irritation around your EPB tendon sheath. In severe cases, surgery may be needed. Follow these instructions at home: If you have a splint or brace:  Wear it as told by your health care provider. Remove it only as told by your health care provider.  Loosen the splint or brace if your fingers become numb and tingle, or if they turn cold and blue.  Keep the splint or brace clean and dry. Managing pain, stiffness, and swelling  If directed, apply ice to the injured area. ? Put ice in a plastic bag. ? Place a towel between your skin and the bag. ? Leave the ice on for 20 minutes, 2-3 times per day.  Move your fingers often to avoid stiffness and to lessen swelling.  Raise (elevate) the injured area above the level of your heart while you are sitting or lying down. General instructions  Return to your normal activities as told by your health care provider. Ask your health care provider what activities are safe for you.  Take over-the-counter  and prescription medicines only as told by your health care provider.  Keep all follow-up visits as told by your health care provider. This is important.  Do not drive or operate heavy machinery while taking prescription pain medicine. Contact a health care provider if:  Your pain, tenderness, or swelling gets worse, even if you have had treatment.  You have numbness or tingling in your wrist, hand, or fingers on the injured side. This information is not intended to  replace advice given to you by your health care provider. Make sure you discuss any questions you have with your health care provider. Document Released: 02/23/2005 Document Revised: 08/01/2015 Document Reviewed: 05/01/2014 Elsevier Interactive Patient Education  [employer].

## 2017-10-21 ENCOUNTER — Encounter: Payer: Self-pay | Admitting: Family Medicine

## 2017-10-22 ENCOUNTER — Ambulatory Visit: Payer: PRIVATE HEALTH INSURANCE | Admitting: Family Medicine

## 2017-10-22 ENCOUNTER — Other Ambulatory Visit: Payer: Self-pay

## 2017-10-22 ENCOUNTER — Encounter: Payer: Self-pay | Admitting: Family Medicine

## 2017-10-22 VITALS — BP 130/82 | HR 76 | Temp 97.9°F | Resp 15 | Ht <= 58 in | Wt 204.6 lb

## 2017-10-22 DIAGNOSIS — M654 Radial styloid tenosynovitis [de Quervain]: Secondary | ICD-10-CM | POA: Diagnosis not present

## 2017-10-24 NOTE — Progress Notes (Signed)
Subjective  CC:  Chief Complaint  Patient presents with  . Wrist Pain    Left wrist pain, worse over the past week, no better, pain radiating into left elbow    HPI: Sandra Lara is a 57 y.o. female who presents to the office today to address the problems listed above in the chief complaint.  Follow up for thumb tenosynovitis: has used splint consistently x 2 weeks but pain remains. Mildly worsening. Has used ice and nsaids w/o relief. No new sxs. No weakness.    Assessment  1. De Quervain's tenosynovitis, left      Plan   S/p steroid injection:  Routine post procedure care instructions were given to patient in detail.  Follow up: Return if symptoms worsen or fail to improve.   No orders of the defined types were placed in this encounter.  No orders of the defined types were placed in this encounter.     I reviewed the patients updated PMH, FH, and SocHx.    Patient Active Problem List   Diagnosis Date Noted  . DJD (degenerative joint disease), lumbar 08/26/2017  . Class 3 severe obesity due to excess calories without serious comorbidity with body mass index (BMI) of 40.0 to 44.9 in adult ([place]) 10/29/2016  . Seasonal allergic rhinitis due to pollen 05/21/2016  . Tubular adenoma of colon 11/26/2015  . Diverticulosis of large intestine 05/24/2015  . Gastroesophageal reflux disease without esophagitis 09/28/2014  . Menopause syndrome 09/28/2014   Current Meds  Medication Sig  . albuterol (PROVENTIL HFA;VENTOLIN HFA) 108 (90 Base) MCG/ACT inhaler Inhale 2 puffs into the lungs every 4 (four) hours as needed.  . Ascorbic Acid (VITAMIN C) 100 MG tablet Take 100 mg by mouth daily.  . Azelastine HCl 137 MCG/SPRAY SOLN USE 1 SPRAY IN EACH NOSTRIL 2 TIMES DAILY AS DIRECTED  . Cholecalciferol (VITAMIN D3) 5000 units TABS Take by mouth daily.  . diclofenac (VOLTAREN) 75 MG EC tablet Take 1 tablet (75 mg total) by mouth 2 (two) times daily.  . fluticasone (FLONASE) 50  MCG/ACT nasal spray Place 1 spray into both nostrils 2 (two) times daily.  . Multiple Vitamins-Minerals (THERA-M) TABS Take 1 tablet by mouth daily.  Marland Kitchen omega-3 acid ethyl esters (LOVAZA) 1 G capsule Take by mouth 2 (two) times daily.  Marland Kitchen omeprazole (PRILOSEC) 20 MG capsule Take 1 capsule by mouth daily.  Marland Kitchen SACCHAROMYCES BOULARDII PO Take by mouth.    Allergies: Patient is allergic to sulfamethoxazole-trimethoprim; doxycycline; erythromycin; achromycin [tetracycline]; and macrolides and ketolides. Family History: Patient family history includes Arthritis in her mother; Breast cancer (age of onset: 60) in her mother; COPD in her mother; Cancer in her mother; Diabetes in her maternal grandfather; Hearing loss in her mother; Heart attack in her brother and maternal grandfather; Hyperlipidemia in her mother; Stroke in her maternal grandmother. Social History:  Patient  reports that she quit smoking about 5 years ago. She has never used smokeless tobacco. She reports that she drinks about 7.0 standard drinks of alcohol per week. She reports that she does not use drugs.  Review of Systems: Constitutional: Negative for fever malaise or anorexia Cardiovascular: negative for chest pain Respiratory: negative for SOB or persistent cough Gastrointestinal: negative for abdominal pain  Objective  Vitals: BP 130/82   Pulse 76   Temp 97.9 F (36.6 C) (Oral)   Resp 15   Ht 4' 9.5" (1.461 m)   Wt 204 lb 9.6 oz (92.8 kg)   SpO2  99%   BMI 43.51 kg/m  General: no acute distress , A&Ox3 Left wrist exam: + finklesteins and tenderness over 1st extensor tendon w/ mild swelling. No redness or warmth. Decreased wrist medial and lateral flexio due to pain but full extension flexion Normal elbow exam  DeQuervains tenosynovitis steroid Injection   Pre-operative diagnosis: Dequervains left Post-operative diagnosis: same  After risks and benefits were explained including bleeding, infection, worsening of the  pain, damage to the area being injected, allergic reaction to medications,   All questions were answered.  Verbal consent obtained. Universal time out done.   The area of the right extensor tendon was identified. point and the skin prepped three times with alcohol and the alcohol allowed to dry.   25 gauge 0.5 inch needle was placed in the area of the tenderness.  The steroid was injected along the tenosynovial sheath.  The patient did tolerate the procedure well and there were not complications.    Medication used: 10 mg Kenalog; 0.75 mL 1% lidocaine.     Commons side effects, risks, benefits, and alternatives for medications and treatment plan prescribed today were discussed, and the patient expressed understanding of the given instructions. Patient is instructed to call or message via MyChart if he/she has any questions or concerns regarding our treatment plan. No barriers to understanding were identified. We discussed Red Flag symptoms and signs in detail. Patient expressed understanding regarding what to do in case of urgent or emergency type symptoms.   Medication list was reconciled, printed and provided to the patient in AVS. Patient instructions and summary information was reviewed with the patient as documented in the AVS. This note was prepared with assistance of Dragon voice recognition software. Occasional wrong-word or sound-a-like substitutions may have occurred due to the inherent limitations of voice recognition software

## 2017-12-14 ENCOUNTER — Ambulatory Visit: Payer: PRIVATE HEALTH INSURANCE | Admitting: Family Medicine

## 2017-12-14 ENCOUNTER — Encounter: Payer: Self-pay | Admitting: Family Medicine

## 2017-12-14 VITALS — BP 130/88 | HR 60 | Ht <= 58 in | Wt 205.6 lb

## 2017-12-14 DIAGNOSIS — M545 Low back pain, unspecified: Secondary | ICD-10-CM

## 2017-12-14 DIAGNOSIS — M7062 Trochanteric bursitis, left hip: Secondary | ICD-10-CM | POA: Diagnosis not present

## 2017-12-14 DIAGNOSIS — Z23 Encounter for immunization: Secondary | ICD-10-CM

## 2017-12-14 MED ORDER — TRIAMCINOLONE ACETONIDE 40 MG/ML IJ SUSP
20.0000 mg | Freq: Once | INTRAMUSCULAR | Status: AC
  Administered 2017-12-14: 20 mg via INTRA_ARTICULAR

## 2017-12-14 NOTE — Addendum Note (Signed)
Addended bySigurd Sos on: 12/14/2017 04:28 PM   Modules accepted: Orders

## 2017-12-14 NOTE — Progress Notes (Signed)
Subjective  CC:  Chief Complaint  Patient presents with  . Hip Pain    Left hip pain x 5 days    HPI: Sandra Lara is a 57 y.o. female who presents to the office today to address the problems listed above in the chief complaint.  Sandra Lara presents due to worsening left hip pain.  This started mildly and intermittently about 2 months ago.  But over the last 5 days or so, it is really ramped up.  She is going on vacation this weekend and would like to be able to walk without pain she does describe pain in the sciatic area, left low back, and left hip.  She has pain when lying on that side.  She has experienced these symptoms in the past but mostly on the right.  Those symptoms in the right have resolved.  She denies weakness urinary or bowel bladder incontinence, or radicular symptoms.  She is in no trauma.  She has had hip bursitis on the right and did well with a steroid injection.  She has used Advil with only minimal relief.  She does do some stretching activities.  She does not do much core glutes strengthening. Assessment  1. Trochanteric bursitis of left hip   2. Acute left-sided low back pain without sciatica      Plan   Left hip bursitis: Status post steroid injection.  Routine postprocedure care instructions given including icing and stretching.  Back exercises given as well.  Low back pain, musculoskeletal.  Recommend core strengthening.  We discussed these options.  Advil as needed no red flags identified  Follow up: Return if symptoms worsen or fail to improve.   No orders of the defined types were placed in this encounter.  Meds ordered this encounter  Medications  . triamcinolone acetonide (KENALOG-40) injection 20 mg      I reviewed the patients updated PMH, FH, and SocHx.    Patient Active Problem List   Diagnosis Date Noted  . DJD (degenerative joint disease), lumbar 08/26/2017  . Class 3 severe obesity due to excess calories without serious comorbidity  with body mass index (BMI) of 40.0 to 44.9 in adult ([place]) 10/29/2016  . Seasonal allergic rhinitis due to pollen 05/21/2016  . Tubular adenoma of colon 11/26/2015  . Diverticulosis of large intestine 05/24/2015  . Gastroesophageal reflux disease without esophagitis 09/28/2014  . Menopause syndrome 09/28/2014   Current Meds  Medication Sig  . albuterol (PROVENTIL HFA;VENTOLIN HFA) 108 (90 Base) MCG/ACT inhaler Inhale 2 puffs into the lungs every 4 (four) hours as needed.  . Ascorbic Acid (VITAMIN C) 100 MG tablet Take 100 mg by mouth daily.  . Azelastine HCl 137 MCG/SPRAY SOLN USE 1 SPRAY IN EACH NOSTRIL 2 TIMES DAILY AS DIRECTED  . Cholecalciferol (VITAMIN D3) 5000 units TABS Take by mouth daily.  . ciprofloxacin (CIPRO) 500 MG tablet Take by mouth.  . Multiple Vitamins-Minerals (THERA-M) TABS Take 1 tablet by mouth daily.  Marland Kitchen omega-3 acid ethyl esters (LOVAZA) 1 G capsule Take by mouth 2 (two) times daily.  Marland Kitchen omeprazole (PRILOSEC) 20 MG capsule Take 1 capsule by mouth daily.  Marland Kitchen SACCHAROMYCES BOULARDII PO Take by mouth.  . [DISCONTINUED] diclofenac (VOLTAREN) 75 MG EC tablet Take 1 tablet (75 mg total) by mouth 2 (two) times daily.  . [DISCONTINUED] fluticasone (FLONASE) 50 MCG/ACT nasal spray Place 1 spray into both nostrils 2 (two) times daily.    Allergies: Patient is allergic to sulfamethoxazole-trimethoprim; doxycycline; erythromycin;  achromycin [tetracycline]; and macrolides and ketolides. Family History: Patient family history includes Arthritis in her mother; Breast cancer (age of onset: 30) in her mother; COPD in her mother; Cancer in her mother; Diabetes in her maternal grandfather; Hearing loss in her mother; Heart attack in her brother and maternal grandfather; Hyperlipidemia in her mother; Stroke in her maternal grandmother. Social History:  Patient  reports that she quit smoking about 5 years ago. She has never used smokeless tobacco. She reports that she drinks about 7.0  standard drinks of alcohol per week. She reports that she does not use drugs.  Review of Systems: Constitutional: Negative for fever malaise or anorexia Cardiovascular: negative for chest pain Respiratory: negative for SOB or persistent cough Gastrointestinal: negative for abdominal pain  Objective  Vitals: BP 130/88   Pulse 60   Ht 4' 9.5" (1.461 m)   Wt 205 lb 9.6 oz (93.3 kg)   SpO2 98%   BMI 43.72 kg/m  General: no acute distress , A&Ox3 Back: No SI joint tenderness no lumbar tenderness no muscle spasm, full range of motion, left sciatic notch mildly tender, negative straight leg raise bilaterally left hip bursitis tender  skin:  Warm, no rashes  GR Trochanteric Bursa steroid injection  Procedure Note   Pre-operative Diagnosis: left hip bursitis   Post-operative Diagnosis: same   Indications: pain   Anesthesia: cold spray   Procedure Details    Verbal consent was obtained for the procedure. Universal time out done. The point of maximum tenderness was identified and marked over the hip bursa. The skin prepped with alcohol and cold spray used for anesthesia. A needle was advanced into the bursa and the steroid/lido was administered easily.    Complications:  None; patient tolerated the procedure well.    Commons side effects, risks, benefits, and alternatives for medications and treatment plan prescribed today were discussed, and the patient expressed understanding of the given instructions. Patient is instructed to call or message via MyChart if he/she has any questions or concerns regarding our treatment plan. No barriers to understanding were identified. We discussed Red Flag symptoms and signs in detail. Patient expressed understanding regarding what to do in case of urgent or emergency type symptoms.   Medication list was reconciled, printed and provided to the patient in AVS. Patient instructions and summary information was reviewed with the patient as documented in  the AVS. This note was prepared with assistance of Dragon voice recognition software. Occasional wrong-word or sound-a-like substitutions may have occurred due to the inherent limitations of voice recognition software

## 2017-12-14 NOTE — Patient Instructions (Signed)
Please follow up if symptoms do not improve or as needed.   Have a great vacation! Ice the hip and start stretching again.   Back Exercises The following exercises strengthen the muscles that help to support the back. They also help to keep the lower back flexible. Doing these exercises can help to prevent back pain or lessen existing pain. If you have back pain or discomfort, try doing these exercises 2-3 times each day or as told by your health care provider. When the pain goes away, do them once each day, but increase the number of times that you repeat the steps for each exercise (do more repetitions). If you do not have back pain or discomfort, do these exercises once each day or as told by your health care provider. Exercises Single Knee to Chest  Repeat these steps 3-5 times for each leg: 1. Lie on your back on a firm bed or the floor with your legs extended. 2. Bring one knee to your chest. Your other leg should stay extended and in contact with the floor. 3. Hold your knee in place by grabbing your knee or thigh. 4. Pull on your knee until you feel a gentle stretch in your lower back. 5. Hold the stretch for 10-30 seconds. 6. Slowly release and straighten your leg.  Pelvic Tilt  Repeat these steps 5-10 times: 1. Lie on your back on a firm bed or the floor with your legs extended. 2. Bend your knees so they are pointing toward the ceiling and your feet are flat on the floor. 3. Tighten your lower abdominal muscles to press your lower back against the floor. This motion will tilt your pelvis so your tailbone points up toward the ceiling instead of pointing to your feet or the floor. 4. With gentle tension and even breathing, hold this position for 5-10 seconds.  Cat-Cow  Repeat these steps until your lower back becomes more flexible: 1. Get into a hands-and-knees position on a firm surface. Keep your hands under your shoulders, and keep your knees under your hips. You may place  padding under your knees for comfort. 2. Let your head hang down, and point your tailbone toward the floor so your lower back becomes rounded like the back of a cat. 3. Hold this position for 5 seconds. 4. Slowly lift your head and point your tailbone up toward the ceiling so your back forms a sagging arch like the back of a cow. 5. Hold this position for 5 seconds.  Press-Ups  Repeat these steps 5-10 times: 1. Lie on your abdomen (face-down) on the floor. 2. Place your palms near your head, about shoulder-width apart. 3. While you keep your back as relaxed as possible and keep your hips on the floor, slowly straighten your arms to raise the top half of your body and lift your shoulders. Do not use your back muscles to raise your upper torso. You may adjust the placement of your hands to make yourself more comfortable. 4. Hold this position for 5 seconds while you keep your back relaxed. 5. Slowly return to lying flat on the floor.  Bridges  Repeat these steps 10 times: 1. Lie on your back on a firm surface. 2. Bend your knees so they are pointing toward the ceiling and your feet are flat on the floor. 3. Tighten your buttocks muscles and lift your buttocks off of the floor until your waist is at almost the same height as your knees. You should feel  the muscles working in your buttocks and the back of your thighs. If you do not feel these muscles, slide your feet 1-2 inches farther away from your buttocks. 4. Hold this position for 3-5 seconds. 5. Slowly lower your hips to the starting position, and allow your buttocks muscles to relax completely.  If this exercise is too easy, try doing it with your arms crossed over your chest. Abdominal Crunches  Repeat these steps 5-10 times: 1. Lie on your back on a firm bed or the floor with your legs extended. 2. Bend your knees so they are pointing toward the ceiling and your feet are flat on the floor. 3. Cross your arms over your  chest. 4. Tip your chin slightly toward your chest without bending your neck. 5. Tighten your abdominal muscles and slowly raise your trunk (torso) high enough to lift your shoulder blades a tiny bit off of the floor. Avoid raising your torso higher than that, because it can put too much stress on your low back and it does not help to strengthen your abdominal muscles. 6. Slowly return to your starting position.  Back Lifts Repeat these steps 5-10 times: 1. Lie on your abdomen (face-down) with your arms at your sides, and rest your forehead on the floor. 2. Tighten the muscles in your legs and your buttocks. 3. Slowly lift your chest off of the floor while you keep your hips pressed to the floor. Keep the back of your head in line with the curve in your back. Your eyes should be looking at the floor. 4. Hold this position for 3-5 seconds. 5. Slowly return to your starting position.  Contact a health care provider if:  Your back pain or discomfort gets much worse when you do an exercise.  Your back pain or discomfort does not lessen within 2 hours after you exercise. If you have any of these problems, stop doing these exercises right away. Do not do them again unless your health care provider says that you can. Get help right away if:  You develop sudden, severe back pain. If this happens, stop doing the exercises right away. Do not do them again unless your health care provider says that you can. This information is not intended to replace advice given to you by your health care provider. Make sure you discuss any questions you have with your health care provider. Document Released: 04/02/2004 Document Revised: 07/03/2015 Document Reviewed: 04/19/2014 Elsevier Interactive Patient Education  2017 [employer].

## 2018-01-13 ENCOUNTER — Encounter: Payer: Self-pay | Admitting: Family Medicine

## 2018-01-13 ENCOUNTER — Other Ambulatory Visit: Payer: Self-pay

## 2018-01-13 ENCOUNTER — Ambulatory Visit (INDEPENDENT_AMBULATORY_CARE_PROVIDER_SITE_OTHER): Payer: PRIVATE HEALTH INSURANCE | Admitting: Family Medicine

## 2018-01-13 VITALS — BP 130/86 | HR 110 | Temp 97.9°F | Ht <= 58 in | Wt 209.0 lb

## 2018-01-13 DIAGNOSIS — N951 Menopausal and female climacteric states: Secondary | ICD-10-CM

## 2018-01-13 DIAGNOSIS — Z0000 Encounter for general adult medical examination without abnormal findings: Secondary | ICD-10-CM | POA: Diagnosis not present

## 2018-01-13 DIAGNOSIS — Z6841 Body Mass Index (BMI) 40.0 and over, adult: Secondary | ICD-10-CM

## 2018-01-13 LAB — COMPREHENSIVE METABOLIC PANEL
ALK PHOS: 69 U/L (ref 39–117)
ALT: 19 U/L (ref 0–35)
AST: 16 U/L (ref 0–37)
Albumin: 4.2 g/dL (ref 3.5–5.2)
BUN: 18 mg/dL (ref 6–23)
CHLORIDE: 101 meq/L (ref 96–112)
CO2: 29 meq/L (ref 19–32)
Calcium: 9.6 mg/dL (ref 8.4–10.5)
Creatinine, Ser: 0.64 mg/dL (ref 0.40–1.20)
GFR: 101.53 mL/min (ref >60.00)
GLUCOSE: 114 mg/dL — AB (ref 70–99)
POTASSIUM: 4.8 meq/L (ref 3.5–5.1)
SODIUM: 136 meq/L (ref 135–145)
TOTAL PROTEIN: 6.5 g/dL (ref 6.0–8.3)
Total Bilirubin: 0.3 mg/dL (ref 0.2–1.2)

## 2018-01-13 LAB — CBC WITH DIFFERENTIAL/PLATELET
Basophils Absolute: 0.1 10*3/uL (ref 0.0–0.1)
Basophils Relative: 0.9 % (ref 0.0–3.0)
EOS PCT: 1.7 % (ref 0.0–5.0)
Eosinophils Absolute: 0.1 10*3/uL (ref 0.0–0.7)
HCT: 42.5 % (ref 36.0–46.0)
Hemoglobin: 14.4 g/dL (ref 12.0–15.0)
Lymphocytes Relative: 22.2 % (ref 12.0–46.0)
Lymphs Abs: 1.4 10*3/uL (ref 0.7–4.0)
MCHC: 33.9 g/dL (ref 30.0–36.0)
MCV: 91.4 fl (ref 78.0–100.0)
MONO ABS: 0.5 10*3/uL (ref 0.1–1.0)
MONOS PCT: 8.6 % (ref 3.0–12.0)
NEUTROS PCT: 66.6 % (ref 43.0–77.0)
Neutro Abs: 4.1 10*3/uL (ref 1.4–7.7)
PLATELETS: 264 10*3/uL (ref 150.0–400.0)
RBC: 4.66 Mil/uL (ref 3.87–5.11)
RDW: 15 % (ref 11.5–15.5)
WBC: 6.1 10*3/uL (ref 4.0–10.5)

## 2018-01-13 LAB — LIPID PANEL
CHOLESTEROL: 200 mg/dL (ref 0–200)
HDL: 54.7 mg/dL (ref >39.00)
LDL Cholesterol: 126 mg/dL — ABNORMAL HIGH (ref 0–99)
NonHDL: 145.23
Total CHOL/HDL Ratio: 4
Triglycerides: 95 mg/dL (ref 0.0–149.0)
VLDL: 19 mg/dL (ref 0.0–40.0)

## 2018-01-13 MED ORDER — OMEPRAZOLE 20 MG PO CPDR: 20.0000 mg | DELAYED_RELEASE_CAPSULE | Freq: Every day | ORAL | 3 refills | Status: DC

## 2018-01-13 NOTE — Patient Instructions (Addendum)
Please return in 12 months for your annual complete physical; please come fasting.   If you have any questions or concerns, please don't hesitate to send me a message via MyChart or call the office at [phone]. Thank you for visiting with [place] today! It's our pleasure caring for you.   Fat and Cholesterol Restricted Diet High levels of fat and cholesterol in your blood may lead to various health problems, such as diseases of the heart, blood vessels, gallbladder, liver, and pancreas. Fats are concentrated sources of energy that come in various forms. Certain types of fat, including saturated fat, may be harmful in excess. Cholesterol is a substance needed by your body in small amounts. Your body makes all the cholesterol it needs. Excess cholesterol comes from the food you eat. When you have high levels of cholesterol and saturated fat in your blood, health problems can develop because the excess fat and cholesterol will gather along the walls of your blood vessels, causing them to narrow. Choosing the right foods will help you control your intake of fat and cholesterol. This will help keep the levels of these substances in your blood within normal limits and reduce your risk of disease. What is my plan? Your health care provider recommends that you:  Limit your fat intake to ______% or less of your total calories per day.  Limit the amount of cholesterol in your diet to less than _________mg per day.  Eat 20-30 grams of fiber each day.  What types of fat should I choose?  Choose healthy fats more often. Choose monounsaturated and polyunsaturated fats, such as olive and canola oil, flaxseeds, walnuts, almonds, and seeds.  Eat more omega-3 fats. Good choices include salmon, mackerel, sardines, tuna, flaxseed oil, and ground flaxseeds. Aim to eat fish at least two times a week.  Limit saturated fats. Saturated fats are primarily found in animal products, such as meats, butter, and cream.  Plant sources of saturated fats include palm oil, palm kernel oil, and coconut oil.  Avoid foods with partially hydrogenated oils in them. These contain trans fats. Examples of foods that contain trans fats are stick margarine, some tub margarines, cookies, crackers, and other baked goods. What general guidelines do I need to follow? These guidelines for healthy eating will help you control your intake of fat and cholesterol:  Check food labels carefully to identify foods with trans fats or high amounts of saturated fat.  Fill one half of your plate with vegetables and green salads.  Fill one fourth of your plate with whole grains. Look for the word "whole" as the first word in the ingredient list.  Fill one fourth of your plate with lean protein foods.  Limit fruit to two servings a day. Choose fruit instead of juice.  Eat more foods that contain fiber, such as apples, broccoli, carrots, beans, peas, and barley.  Eat more home-cooked food and less restaurant, buffet, and fast food.  Limit or avoid alcohol.  Limit foods high in starch and sugar.  Limit fried foods.  Cook foods using methods other than frying. Baking, boiling, grilling, and broiling are all great options.  Lose weight if you are overweight. Losing just 5-10% of your initial body weight can help your overall health and prevent diseases such as diabetes and heart disease.  What foods can I eat? Grains  Whole grains, such as whole wheat or whole grain breads, crackers, cereals, and pasta. Unsweetened oatmeal, bulgur, barley, quinoa, or brown rice. Corn or whole  wheat flour tortillas. Vegetables  Fresh or frozen vegetables (raw, steamed, roasted, or grilled). Green salads. Fruits  All fresh, canned (in natural juice), or frozen fruits. Meats and other protein foods  Ground beef (85% or leaner), grass-fed beef, or beef trimmed of fat. Skinless chicken or [place]. Ground chicken or [place]. Pork trimmed of fat. All  fish and seafood. Eggs. Dried beans, peas, or lentils. Unsalted nuts or seeds. Unsalted canned or dry beans. Dairy  Low-fat dairy products, such as skim or 1% milk, 2% or reduced-fat cheeses, low-fat ricotta or cottage cheese, or plain low-fat yo Fats and oils  Tub margarines without trans fats. Light or reduced-fat mayonnaise and salad dressings. Avocado. Olive, canola, sesame, or safflower oils. Natural peanut or almond butter (choose ones without added sugar and oil). The items listed above may not be a complete list of recommended foods or beverages. Contact your dietitian for more options. Foods to avoid Grains  White bread. White pasta. White rice. Cornbread. Bagels, pastries, and croissants. Crackers that contain trans fat. Vegetables  White potatoes. Corn. Creamed or fried vegetables. Vegetables in a cheese sauce. Fruits  Dried fruits. Canned fruit in light or heavy syrup. Fruit juice. Meats and other protein foods  Fatty cuts of meat. Ribs, chicken wings, bacon, sausage, bologna, salami, chitterlings, fatback, hot dogs, bratwurst, and packaged luncheon meats. Liver and organ meats. Dairy  Whole or 2% milk, cream, half-and-half, and cream cheese. Whole milk cheeses. Whole-fat or sweetened yogurt. Full-fat cheeses. Nondairy creamers and whipped toppings. Processed cheese, cheese spreads, or cheese curds. Beverages  Alcohol. Sweetened drinks (such as sodas, lemonade, and fruit drinks or punches). Fats and oils  Butter, stick margarine, lard, shortening, ghee, or bacon fat. Coconut, palm kernel, or palm oils. Sweets and desserts  Corn syrup, sugars, honey, and molasses. Candy. Jam and jelly. Syrup. Sweetened cereals. Cookies, pies, cakes, donuts, muffins, and ice cream. The items listed above may not be a complete list of foods and beverages to avoid. Contact your dietitian for more information. This information is not intended to replace advice given to you by your health care  provider. Make sure you discuss any questions you have with your health care provider. Document Released: 02/23/2005 Document Revised: 03/16/2014 Document Reviewed: 05/24/2013 Elsevier Interactive Patient Education  2018 [place] (AHA) Exercise Recommendation  Being physically active is important to prevent heart disease and stroke, the nation's No. 1and No. 5killers. To improve overall cardiovascular health, we suggest at least 150 minutes per week of moderate exercise or 75 minutes per week of vigorous exercise (or a combination of moderate and vigorous activity). Thirty minutes a day, five times a week is an easy goal to remember. You will also experience benefits even if you divide your time into two or three segments of 10 to 15 minutes per day.  For people who would benefit from lowering their blood pressure or cholesterol, we recommend 40 minutes of aerobic exercise of moderate to vigorous intensity three to four times a week to lower the risk for heart attack and stroke.  Physical activity is anything that makes you move your body and burn calories.  This includes things like climbing stairs or playing sports. Aerobic exercises benefit your heart, and include walking, jogging, swimming or biking. Strength and stretching exercises are best for overall stamina and flexibility.  The simplest, positive change you can make to effectively improve your heart health is to start walking. It's enjoyable, free, easy, social and great  exercise. A walking program is flexible and boasts high success rates because people can stick with it. It's easy for walking to become a regular and satisfying part of life.   For Overall Cardiovascular Health:  At least 30 minutes of moderate-intensity aerobic activity at least 5 days per week for a total of 150  OR   At least 25 minutes of vigorous aerobic activity at least 3 days per week for a total of 75 minutes; or a  combination of moderate- and vigorous-intensity aerobic activity  AND   Moderate- to high-intensity muscle-strengthening activity at least 2 days per week for additional health benefits.  For Lowering Blood Pressure and Cholesterol  An average 40 minutes of moderate- to vigorous-intensity aerobic activity 3 or 4 times per week  What if I can't make it to the time goal? Something is always better than nothing! And everyone has to start somewhere. Even if you've been sedentary for years, today is the day you can begin to make healthy changes in your life. If you don't think you'll make it for 30 or 40 minutes, set a reachable goal for today. You can work up toward your overall goal by increasing your time as you get stronger. Don't let all-or-nothing thinking rob you of doing what you can every day.  Source:http://www.heart.org

## 2018-01-13 NOTE — Progress Notes (Signed)
Subjective  Chief Complaint  Patient presents with  . Annual Exam    doing well,   . Nasal Congestion    x 4 days, has been taking OTC Cold and Sinus, feeling some better since monday     HPI: Sandra Lara is a 57 y.o. female who presents to [place] [facility] today for a Female Wellness Visit.   Wellness Visit: annual visit with health maintenance review and exam without Pap   Doing fine. Had hip steroid injection a few weeks ago and feels well now. No concerns. Has mild URI w/o bronchospasm  Assessment  1. Annual physical exam   2. Class 3 severe obesity due to excess calories without serious comorbidity with body mass index (BMI) of 40.0 to 44.9 in adult ([place])   3. Menopause syndrome      Plan  Female Wellness Visit:  Age appropriate Health Maintenance and Prevention measures were discussed with patient. Included topics are cancer screening recommendations, ways to keep healthy (see AVS) including dietary and exercise recommendations, regular eye and dental care, use of seat belts, and avoidance of moderate alcohol use and tobacco use.   BMI: discussed patient's BMI and encouraged positive lifestyle modifications to help get to or maintain a target BMI.see avs  HM needs and immunizations were addressed and ordered. See below for orders. See HM and immunization section for updates.Up to date.   Routine labs and screening tests ordered including cmp, cbc and lipids where appropriate.  Discussed recommendations regarding Vit D and calcium supplementation (see AVS)  Follow up: Return in 1 year (on 01/14/2019) for complete physical, come fasting.   Orders Placed This Encounter  Procedures  . CBC with Differential/Platelet  . Comprehensive metabolic panel  . Lipid panel  . HIV Antibody (routine testing w rflx)  . Hepatitis C antibody   Meds ordered this encounter  Medications  . omeprazole (PRILOSEC) 20 MG capsule    Sig: Take 1 capsule (20  mg total) by mouth daily.    Dispense:  90 capsule    Refill:  3     Lifestyle: Body mass index is 44.44 kg/m. Wt Readings from Last 3 Encounters:  01/13/18 209 lb (94.8 kg)  12/14/17 205 lb 9.6 oz (93.3 kg)  10/22/17 204 lb 9.6 oz (92.8 kg)   Diet: general Exercise: rarely,   Patient Active Problem List   Diagnosis Date Noted  . DJD (degenerative joint disease), lumbar 08/26/2017    Xray 2017   . Class 3 severe obesity due to excess calories without serious comorbidity with body mass index (BMI) of 40.0 to 44.9 in adult ([place]) 10/29/2016  . Seasonal allergic rhinitis due to pollen 05/21/2016  . Tubular adenoma of colon 11/26/2015    Overview:  Repeat q 5 years. Next 2022   . Diverticulosis of large intestine 05/24/2015    Overview:  Colonoscopy - has had recurrent diverticulitis x 3 in 2016   . Gastroesophageal reflux disease without esophagitis 09/28/2014  . Menopause syndrome 09/28/2014    Overview:  [facility] Henri MD [facility]    Health Maintenance  Topic Date Due  . Hepatitis C Screening  1960-10-30  . HIV Screening  08/27/2018 (Originally 09/02/1975)  . MAMMOGRAM  11/20/2018  . PAP SMEAR  05/23/2020  . COLONOSCOPY  11/21/2020  . TETANUS/TDAP  09/27/2024  . INFLUENZA VACCINE  Completed   Immunization History  Administered Date(s) Administered  . Influenza,inj,Quad PF,6+ Mos 03/19/2016, 12/14/2017  .  Tdap 09/28/2014   We updated and reviewed the patient's past history in detail and it is documented below. Allergies: Patient is allergic to sulfamethoxazole-trimethoprim; doxycycline; erythromycin; achromycin [tetracycline]; and macrolides and ketolides. Past Medical History Patient  has a past medical history of Allergy, Diverticulitis, Diverticulitis, Family history of polyps in the colon, GERD (gastroesophageal reflux disease), Hyperlipidemia, and OSA on CPAP. Past Surgical History Patient  has a past surgical history that includes  Cholecystectomy (2005); Wrist fracture surgery (Right); Gallbladder surgery (2005); Bone graft hip iliac crest; colon polyps ; and prolapse. Family History: Patient family history includes Arthritis in her mother; Breast cancer (age of onset: 85) in her mother; COPD in her mother; Cancer in her mother; Diabetes in her maternal grandfather; Hearing loss in her mother; Heart attack in her brother and maternal grandfather; Hyperlipidemia in her mother; Stroke in her maternal grandmother. Social History:  Patient  reports that she quit smoking about 5 years ago. She has never used smokeless tobacco. She reports that she drinks about 7.0 standard drinks of alcohol per week. She reports that she does not use drugs.  Review of Systems: Constitutional: negative for fever or malaise Ophthalmic: negative for photophobia, double vision or loss of vision Cardiovascular: negative for chest pain, dyspnea on exertion, or new LE swelling Respiratory: negative for SOB or persistent cough Gastrointestinal: negative for abdominal pain, change in bowel habits or melena Genitourinary: negative for dysuria or gross hematuria, no abnormal uterine bleeding or disharge Musculoskeletal: negative for new gait disturbance or muscular weakness Integumentary: negative for new or persistent rashes, no breast lumps Neurological: negative for TIA or stroke symptoms Psychiatric: negative for SI or delusions Allergic/Immunologic: negative for hives  Patient Care Team    Relationship Specialty Notifications Start End  [name], MD PCP - General Family Medicine  08/26/17   [name], MD Referring Physician Gastroenterology  08/26/17   [name], MD Consulting Physician Ophthalmology  08/26/17   Dr. Juleen Starr    08/26/17    Comment: Dentist, [phone]    Objective  Vitals: BP 130/86   Pulse (!) 110   Temp 97.9 F (36.6 C)   Ht 4' 9.5" (1.461 m)   Wt 209 lb (94.8 kg)   SpO2 99%   BMI 44.44 kg/m    General:  Well developed, well nourished, no acute distress  Psych:  Alert and orientedx3,normal mood and affect HEENT:  Normocephalic, atraumatic, non-icteric sclera, PERRL, oropharynx is clear without mass or exudate, supple neck without adenopathy, mass or thyromegaly Cardiovascular:  Normal S1, S2, RRR without gallop, rub or murmur, nondisplaced PMI Respiratory:  Good breath sounds bilaterally, CTAB with normal respiratory effort Gastrointestinal: normal bowel sounds, soft, non-tender, no noted masses. No HSM MSK: no deformities, contusions. Joints are without erythema or swelling. Spine and CVA region are nontender Skin:  Warm, no rashes or suspicious lesions noted Neurologic:    Mental status is normal. CN 2-11 are normal. Gross motor and sensory exams are normal. Normal gait. No tremor Breast Exam: No mass, skin retraction or nipple discharge is appreciated in either breast. No axillary adenopathy. Fibrocystic changes are not noted    Commons side effects, risks, benefits, and alternatives for medications and treatment plan prescribed today were discussed, and the patient expressed understanding of the given instructions. Patient is instructed to call or message via MyChart if he/she has any questions or concerns regarding our treatment plan. No barriers to understanding were identified. We discussed Red Flag symptoms and signs  in detail. Patient expressed understanding regarding what to do in case of urgent or emergency type symptoms.   Medication list was reconciled, printed and provided to the patient in AVS. Patient instructions and summary information was reviewed with the patient as documented in the AVS. This note was prepared with assistance of Dragon voice recognition software. Occasional wrong-word or sound-a-like substitutions may have occurred due to the inherent limitations of voice recognition software

## 2018-01-14 LAB — HEPATITIS C ANTIBODY
HEP C AB: NONREACTIVE
SIGNAL TO CUT-OFF: 0 (ref <1.00)

## 2018-01-14 LAB — HIV ANTIBODY (ROUTINE TESTING W REFLEX): HIV: NONREACTIVE

## 2018-03-10 ENCOUNTER — Ambulatory Visit: Payer: Self-pay | Admitting: Registered Nurse

## 2018-03-10 ENCOUNTER — Encounter: Payer: Self-pay | Admitting: Registered Nurse

## 2018-03-10 VITALS — BP 134/90 | HR 64 | Temp 97.4°F

## 2018-03-10 DIAGNOSIS — M5416 Radiculopathy, lumbar region: Secondary | ICD-10-CM

## 2018-03-10 MED ORDER — PREDNISONE 10 MG (21) PO TBPK: ORAL_TABLET | ORAL | 0 refills | Status: DC

## 2018-03-10 MED ORDER — DICLOFENAC SODIUM 75 MG PO TBEC: 75.0000 mg | DELAYED_RELEASE_TABLET | Freq: Two times a day (BID) | ORAL | 0 refills | Status: AC | PRN

## 2018-03-10 MED ORDER — MENTHOL (TOPICAL ANALGESIC) 4 % EX GEL: 1.0000 "application " | Freq: Four times a day (QID) | CUTANEOUS | 0 refills | Status: AC | PRN

## 2018-03-10 NOTE — Progress Notes (Signed)
Subjective:    Patient ID: Sandra Lara, female    DOB: 10-10-1960, 58 y.o.   MRN: [id number]  57y/o Caucasian established female pt c/o L lower buttock pain extending down back of leg to about mid calf. Describes pain as a burning sensation. States similar pain in Oct 2019 that was relieved after a couple of weeks of Ibuprofen, Theracaine, and using a tennis ball as a muscle roller. Pain returned approx 4 days ago. No known acute injury prior to this. Has been using same treatment at home since onset of pain. She also started a new plant based diet on 03/07/18 hoping it will help reduce inflammation. Saw neurology Jun 2019 had MRI showed lumbar bulging discs treated with prednisone and ibuprofen worked well but she is wondering if she could try diclofenac since she had already been taking motrin this episode.  Denied loss of bowel/bladder control, saddle paresthesias or arm/leg weakness.  Patient trying to lose weight and get healthier with diet as New Year resolution.  Asking about soy/tofu intake and if contraindicated or risks for her.     Review of Systems  Constitutional: Negative for activity change, appetite change, chills, diaphoresis, fatigue and fever.  HENT: Negative for trouble swallowing and voice change.   Eyes: Negative for photophobia and visual disturbance.  Respiratory: Negative for cough, shortness of breath, wheezing and stridor.   Cardiovascular: Negative for leg swelling.  Gastrointestinal: Negative for abdominal pain, nausea and vomiting.  Endocrine: Negative for cold intolerance and heat intolerance.  Genitourinary: Negative for difficulty urinating.  Musculoskeletal: Positive for back pain and myalgias. Negative for arthralgias, gait problem, joint swelling, neck pain and neck stiffness.  Skin: Negative for color change, pallor, rash and wound.  Allergic/Immunologic: Positive for environmental allergies. Negative for food allergies.  Neurological: Negative for  dizziness, tremors, seizures, syncope, facial asymmetry, speech difficulty, weakness, light-headedness, numbness and headaches.  Hematological: Negative for adenopathy. Does not bruise/bleed easily.  Psychiatric/Behavioral: Negative for agitation, confusion and sleep disturbance.       Objective:   Physical Exam Vitals signs and nursing note reviewed.  Constitutional:      General: She is not in acute distress.    Appearance: Normal appearance. She is well-developed and well-groomed. She is morbidly obese. She is not ill-appearing, toxic-appearing or diaphoretic.  HENT:     Head: Normocephalic and atraumatic.     Jaw: There is normal jaw occlusion.     Right Ear: Hearing and external ear normal.     Left Ear: Hearing and external ear normal.     Nose: Nose normal. No congestion or rhinorrhea.     Right Turbinates: Not enlarged or swollen.     Left Turbinates: Not enlarged or swollen.     Right Sinus: No maxillary sinus tenderness or frontal sinus tenderness.     Left Sinus: No maxillary sinus tenderness or frontal sinus tenderness.     Mouth/Throat:     Lips: Pink. No lesions.     Mouth: Mucous membranes are moist. No oral lesions or angioedema.     Pharynx: Uvula midline. No oropharyngeal exudate or posterior oropharyngeal erythema.     Tonsils: No tonsillar exudate.  Eyes:     General: Lids are normal. No scleral icterus.       Right eye: No discharge.        Left eye: No discharge.     Extraocular Movements: Extraocular movements intact.     Conjunctiva/sclera: Conjunctivae normal.  Pupils: Pupils are equal, round, and reactive to light.  Neck:     Musculoskeletal: Normal range of motion and neck supple. Normal range of motion. No edema, erythema, neck rigidity, crepitus, pain with movement or muscular tenderness.     Thyroid: No thyromegaly.     Vascular: No JVD.     Trachea: No tracheal deviation.  Cardiovascular:     Rate and Rhythm: Normal rate and regular rhythm.      Pulses:          Radial pulses are 2+ on the right side and 2+ on the left side.     Heart sounds: Normal heart sounds. No murmur.  Pulmonary:     Effort: Pulmonary effort is normal. No respiratory distress.     Breath sounds: Normal breath sounds. No stridor. No wheezing, rhonchi or rales.  Abdominal:     Palpations: Abdomen is soft.     Tenderness: There is no abdominal tenderness.  Musculoskeletal: Normal range of motion.        General: Tenderness present. No deformity.     Right shoulder: Normal.     Left shoulder: Normal.     Right elbow: Normal.    Left elbow: Normal.     Right hip: Normal.     Left hip: She exhibits tenderness. She exhibits normal range of motion, normal strength, no bony tenderness, no swelling, no crepitus, no deformity and no laceration.     Right knee: Normal.     Left knee: Normal.     Right ankle: Normal.     Left ankle: Normal.     Cervical back: Normal.     Thoracic back: Normal.     Lumbar back: She exhibits tenderness, bony tenderness and pain. She exhibits normal range of motion, no swelling, no edema, no deformity, no laceration, no spasm and normal pulse.       Back:     Right hand: Normal.     Left hand: Normal.     Right upper leg: Normal.     Left upper leg: She exhibits tenderness. She exhibits no bony tenderness, no swelling, no edema, no deformity and no laceration.     Right lower leg: Normal.     Left lower leg: Normal.     Comments: Left SI joint TTP; left IT band TTP  Lymphadenopathy:     Head:     Right side of head: No submental, submandibular, preauricular, posterior auricular or occipital adenopathy.     Left side of head: No submental, submandibular, preauricular, posterior auricular or occipital adenopathy.     Cervical: No cervical adenopathy.     Right cervical: No superficial cervical adenopathy.    Left cervical: No superficial cervical adenopathy.  Skin:    General: Skin is warm and dry.     Capillary Refill:  Capillary refill takes less than 2 seconds.     Coloration: Skin is not ashen, cyanotic, jaundiced, mottled, pale or sallow.     Findings: No bruising, erythema or rash.     Nails: There is no clubbing.   Neurological:     General: No focal deficit present.     Mental Status: She is alert and oriented to person, place, and time. Mental status is at baseline. She is not disoriented.     GCS: GCS eye subscore is 4. GCS verbal subscore is 5. GCS motor subscore is 6.     Cranial Nerves: No cranial nerve deficit.  Sensory: No sensory deficit.     Motor: No weakness, tremor, atrophy, abnormal muscle tone or seizure activity.     Coordination: Coordination normal.     Gait: Gait normal.     Deep Tendon Reflexes: Reflexes normal.     Reflex Scores:      Patellar reflexes are 3+ on the right side and 3+ on the left side.    Comments: Strength 5/5 bilaterally arms/legs; on/off exam table and in/out of chair without difficulty; gait sure and steady in hallway  Psychiatric:        Attention and Perception: Attention and perception normal. She is attentive.        Mood and Affect: Mood and affect normal.        Speech: Speech normal.        Behavior: Behavior normal. Behavior is cooperative.        Thought Content: Thought content normal.        Cognition and Memory: Cognition and memory normal.        Judgment: Judgment normal.           Assessment & Plan:  A-left radiculopathy chronic intermittent  P-Patient did not want sedating medication cyclobenazeprine/flexeril 10mg  po TID prn muscle spasms.  Trial prednisone 10mg  po taper with breakfast (60/50/40e/30/20/10mg ) #21 RF0 dispensed from [facility].  Avoid ibuprofen/motrin/naproxen/advil/aleve/mobic/meloxicam while taking diclofenac 75mg  po BID prn pain #30 RF0 dispensed from [facility].    Home stretches demonstrated to patient-e.g. knee to chest, hip ab/adduction and knee flexion/extension and heel slides.  biofreeze gel topical QID prn pain.  Has  at home.  Self massage or professional prn, foam roller use or tennis/racquetball.  Heat/cryotherapy 15 minutes QID prn.  Consider Trial thermacare or cryotherapy alternating available in clinic see RN Hildred Alamin when ready to apply Consider physical therapy referral if no improvement with prescribed therapy from [facility] and/or chiropractic care and/or follow up with neurology.  Ensure ergonomics correct desk at work avoid repetitive motions if possible/holding phone/laptop in hand use desk/stand and/or break up lifting items into smaller loads/weights.  Patient was instructed to rest, ice, and ROM exercises.  Activity as tolerated.   Follow up if symptoms persist or worsen especially if loss of bowel/bladder control, arm/leg weakness and/or saddle paresthesias consider repeat imaging.  Exitcare handout on lumbar radiculopathy and rehab exercises.  Patient verbalized agreement and understanding of treatment plan and had no further questions at this time.  P2:  Injury Prevention and Fitness  Discussed soy products mimic estrogens.  Do not recommend high soy intake if personal or family history breast/reproductive cancers.  Patient had 1 aunt with breast surgery unknown if cancer.  Denied personal breast cancer history.  1-2 soy servings per week probably not harmful/contraindicated.  Patient verbalized understanding information/instructions, agreed with plan of care and had no further questions at this time.

## 2018-03-10 NOTE — Patient Instructions (Signed)
Sciatica Rehab Ask your health care provider which exercises are safe for you. Do exercises exactly as told by your health care provider and adjust them as directed. It is normal to feel mild stretching, pulling, tightness, or discomfort as you do these exercises, but you should stop right away if you feel sudden pain or your pain gets worse.Do not begin these exercises until told by your health care provider. Stretching and range of motion exercises These exercises warm up your muscles and joints and improve the movement and flexibility of your hips and your back. These exercises also help to relieve pain, numbness, and tingling. Exercise A: Sciatic nerve glide 1. Sit in a chair with your head facing down toward your chest. Place your hands behind your back. Let your shoulders slump forward. 2. Slowly straighten one of your knees while you tilt your head back as if you are looking toward the ceiling. Only straighten your leg as far as you can without making your symptoms worse. 3. Hold for ____15______ seconds. 4. Slowly return to the starting position. 5. Repeat with your other leg. Repeat _____3_____ times. Complete this exercise __2________ times a day. Exercise B: Knee to chest with hip adduction and internal rotation  1. Lie on your back on a firm surface with both legs straight. 2. Bend one of your knees and move it up toward your chest until you feel a gentle stretch in your lower back and buttock. Then, move your knee toward the shoulder that is on the opposite side from your leg. ? Hold your leg in this position by holding onto the front of your knee. 3. Hold for ______15____ seconds. 4. Slowly return to the starting position. 5. Repeat with your other leg. Repeat _____3_____ times. Complete this exercise _____2_____ times a day. Exercise C: Prone extension on elbows  1. Lie on your abdomen on a firm surface. A bed may be too soft for this exercise. 2. Prop yourself up on your  elbows. 3. Use your arms to help lift your chest up until you feel a gentle stretch in your abdomen and your lower back. ? This will place some of your body weight on your elbows. If this is uncomfortable, try stacking pillows under your chest. ? Your hips should stay down, against the surface that you are lying on. Keep your hip and back muscles relaxed. 4. Hold for ______15____ seconds. 5. Slowly relax your upper body and return to the starting position. Repeat ______3____ times. Complete this exercise ___2_______ times a day. Strengthening exercises These exercises build strength and endurance in your back. Endurance is the ability to use your muscles for a long time, even after they get tired. Exercise D: Pelvic tilt 1. Lie on your back on a firm surface. Bend your knees and keep your feet flat. 2. Tense your abdominal muscles. Tip your pelvis up toward the ceiling and flatten your lower back into the floor. ? To help with this exercise, you may place a small towel under your lower back and try to push your back into the towel. 3. Hold for _____15_____ seconds. 4. Let your muscles relax completely before you repeat this exercise. Repeat ______3____ times. Complete this exercise __2________ times a day. Exercise E: Alternating arm and leg raises  1. Get on your hands and knees on a firm surface. If you are on a hard floor, you may want to use padding to cushion your knees, such as an exercise mat. 2. Line up your arms  and legs. Your hands should be below your shoulders, and your knees should be below your hips. 3. Lift your left leg behind you. At the same time, raise your right arm and straighten it in front of you. ? Do not lift your leg higher than your hip. ? Do not lift your arm higher than your shoulder. ? Keep your abdominal and back muscles tight. ? Keep your hips facing the ground. ? Do not arch your back. ? Keep your balance carefully, and do not hold your breath. 4. Hold for  ____15______ seconds. 5. Slowly return to the starting position and repeat with your right leg and your left arm. Repeat ______3____ times. Complete this exercise ___2_______ times a day. Posture and body mechanics  Body mechanics refers to the movements and positions of your body while you do your daily activities. Posture is part of body mechanics. Good posture and healthy body mechanics can help to relieve stress in your body's tissues and joints. Good posture means that your spine is in its natural S-curve position (your spine is neutral), your shoulders are pulled back slightly, and your head is not tipped forward. The following are general guidelines for applying improved posture and body mechanics to your everyday activities. Standing   When standing, keep your spine neutral and your feet about hip-width apart. Keep a slight bend in your knees. Your ears, shoulders, and hips should line up.  When you do a task in which you stand in one place for a long time, place one foot up on a stable object that is 2-4 inches (5-10 cm) high, such as a footstool. This helps keep your spine neutral. Sitting   When sitting, keep your spine neutral and keep your feet flat on the floor. Use a footrest, if necessary, and keep your thighs parallel to the floor. Avoid rounding your shoulders, and avoid tilting your head forward.  When working at a desk or a computer, keep your desk at a height where your hands are slightly lower than your elbows. Slide your chair under your desk so you are close enough to maintain good posture.  When working at a computer, place your monitor at a height where you are looking straight ahead and you do not have to tilt your head forward or downward to look at the screen. Resting   When lying down and resting, avoid positions that are most painful for you.  If you have pain with activities such as sitting, bending, stooping, or squatting (flexion-based activities), lie in a  position in which your body does not bend very much. For example, avoid curling up on your side with your arms and knees near your chest (fetal position).  If you have pain with activities such as standing for a long time or reaching with your arms (extension-based activities), lie with your spine in a neutral position and bend your knees slightly. Try the following positions: ? Lying on your side with a pillow between your knees. ? Lying on your back with a pillow under your knees. Lifting   When lifting objects, keep your feet at least shoulder-width apart and tighten your abdominal muscles.  Bend your knees and hips and keep your spine neutral. It is important to lift using the strength of your legs, not your back. Do not lock your knees straight out.  Always ask for help to lift heavy or awkward objects. This information is not intended to replace advice given to you by your health care  provider. Make sure you discuss any questions you have with your health care provider. Document Released: 02/23/2005 Document Revised: 10/31/2015 Document Reviewed: 11/09/2014 [employer] Interactive Patient Education  2019 [employer] Sciatica  Sciatica is pain, numbness, weakness, or tingling along the path of the sciatic nerve. The sciatic nerve starts in the lower back and runs down the back of each leg. The nerve controls the muscles in the lower leg and in the back of the knee. It also provides feeling (sensation) to the back of the thigh, the lower leg, and the sole of the foot. Sciatica is a symptom of another medical condition that pinches or puts pressure on the sciatic nerve. Generally, sciatica only affects one side of the body. Sciatica usually goes away on its own or with treatment. In some cases, sciatica may keep coming back (recur). What are the causes? This condition is caused by pressure on the sciatic nerve, or pinching of the sciatic nerve. This may be the result of:  A disk in between  the bones of the spine (vertebrae) bulging out too far (herniated disk).  Age-related changes in the spinal disks (degenerative disk disease).  A pain disorder that affects a muscle in the buttock (piriformis syndrome).  Extra bone growth (bone spur) near the sciatic nerve.  An injury or break (fracture) of the pelvis.  Pregnancy.  Tumor (rare). What increases the risk? The following factors may make you more likely to develop this condition:  Playing sports that place pressure or stress on the spine, such as football or weight lifting.  Having poor strength and flexibility.  A history of back injury.  A history of back surgery.  Sitting for long periods of time.  Doing activities that involve repetitive bending or lifting.  Obesity. What are the signs or symptoms? Symptoms can vary from mild to very severe, and they may include:  Any of these problems in the lower back, leg, hip, or buttock: ? Mild tingling or dull aches. ? Burning sensations. ? Sharp pains.  Numbness in the back of the calf or the sole of the foot.  Leg weakness.  Severe back pain that makes movement difficult. These symptoms may get worse when you cough, sneeze, or laugh, or when you sit or stand for long periods of time. Being overweight may also make symptoms worse. In some cases, symptoms may recur over time. How is this diagnosed? This condition may be diagnosed based on:  Your symptoms.  A physical exam. Your health care provider may ask you to do certain movements to check whether those movements trigger your symptoms.  You may have tests, including: ? Blood tests. ? X-rays. ? MRI. ? CT scan. How is this treated? In many cases, this condition improves on its own, without any treatment. However, treatment may include:  Reducing or modifying physical activity during periods of pain.  Exercising and stretching to strengthen your abdomen and improve the flexibility of your  spine.  Icing and applying heat to the affected area.  Medicines that help: ? To relieve pain and swelling. ? To relax your muscles.  Injections of medicines that help to relieve pain, irritation, and inflammation around the sciatic nerve (steroids).  Surgery. Follow these instructions at home: Medicines  Take over-the-counter and prescription medicines only as told by your health care provider.  Do not drive or operate heavy machinery while taking prescription pain medicine. Managing pain  If directed, apply ice to the affected area. ? Put ice in a  plastic bag. ? Place a towel between your skin and the bag. ? Leave the ice on for 20 minutes, 2-3 times a day.  After icing, apply heat to the affected area before you exercise or as often as told by your health care provider. Use the heat source that your health care provider recommends, such as a moist heat pack or a heating pad. ? Place a towel between your skin and the heat source. ? Leave the heat on for 20-30 minutes. ? Remove the heat if your skin turns bright red. This is especially important if you are unable to feel pain, heat, or cold. You may have a greater risk of getting burned. Activity  Return to your normal activities as told by your health care provider. Ask your health care provider what activities are safe for you. ? Avoid activities that make your symptoms worse.  Take brief periods of rest throughout the day. Resting in a lying or standing position is usually better than sitting to rest. ? When you rest for longer periods, mix in some mild activity or stretching between periods of rest. This will help to prevent stiffness and pain. ? Avoid sitting for long periods of time without moving. Get up and move around at least one time each hour.  Exercise and stretch regularly, as told by your health care provider.  Do not lift anything that is heavier than 10 lb (4.5 kg) while you have symptoms of sciatica. When you  do not have symptoms, you should still avoid heavy lifting, especially repetitive heavy lifting.  When you lift objects, always use proper lifting technique, which includes: ? Bending your knees. ? Keeping the load close to your body. ? Avoiding twisting. General instructions  Use good posture. ? Avoid leaning forward while sitting. ? Avoid hunching over while standing.  Maintain a healthy weight. Excess weight puts extra stress on your back and makes it difficult to maintain good posture.  Wear supportive, comfortable shoes. Avoid wearing high heels.  Avoid sleeping on a mattress that is too soft or too hard. A mattress that is firm enough to support your back when you sleep may help to reduce your pain.  Keep all follow-up visits as told by your health care provider. This is important. Contact a health care provider if:  You have pain that wakes you up when you are sleeping.  You have pain that gets worse when you lie down.  Your pain is worse than you have experienced in the past.  Your pain lasts longer than 4 weeks.  You experience unexplained weight loss. Get help right away if:  You lose control of your bowel or bladder (incontinence).  You have: ? Weakness in your lower back, pelvis, buttocks, or legs that gets worse. ? Redness or swelling of your back. ? A burning sensation when you urinate. This information is not intended to replace advice given to you by your health care provider. Make sure you discuss any questions you have with your health care provider. Document Released: 02/17/2001 Document Revised: 07/30/2015 Document Reviewed: 11/02/2014 [employer] Interactive Patient Education  2019 [employer] Acute Back Pain, Adult Acute back pain is sudden and usually short-lived. It is often caused by an injury to the muscles and tissues in the back. The injury may result from:  A muscle or ligament getting overstretched or torn (strained). Ligaments are tissues  that connect bones to each other. Lifting something improperly can cause a back strain.  Wear and  tear (degeneration) of the spinal disks. Spinal disks are circular tissue that provides cushioning between the bones of the spine (vertebrae).  Twisting motions, such as while playing sports or doing yard work.  A hit to the back.  Arthritis. You may have a physical exam, lab tests, and imaging tests to find the cause of your pain. Acute back pain usually goes away with rest and home care. Follow these instructions at home: Managing pain, stiffness, and swelling  Take over-the-counter and prescription medicines only as told by your health care provider.  Your health care provider may recommend applying ice during the first 24-48 hours after your pain starts. To do this: ? Put ice in a plastic bag. ? Place a towel between your skin and the bag. ? Leave the ice on for 20 minutes, 2-3 times a day.  If directed, apply heat to the affected area as often as told by your health care provider. Use the heat source that your health care provider recommends, such as a moist heat pack or a heating pad. ? Place a towel between your skin and the heat source. ? Leave the heat on for 20-30 minutes. ? Remove the heat if your skin turns bright red. This is especially important if you are unable to feel pain, heat, or cold. You have a greater risk of getting burned. Activity   Do not stay in bed. Staying in bed for more than 1-2 days can delay your recovery.  Sit up and stand up straight. Avoid leaning forward when you sit, or hunching over when you stand. ? If you work at a desk, sit close to it so you do not need to lean over. Keep your chin tucked in. Keep your neck drawn back, and keep your elbows bent at a right angle. Your arms should look like the letter "L." ? Sit high and close to the steering wheel when you drive. Add lower back (lumbar) support to your car seat, if needed.  Take short walks on  even surfaces as soon as you are able. Try to increase the length of time you walk each day.  Do not sit, drive, or stand in one place for more than 30 minutes at a time. Sitting or standing for long periods of time can put stress on your back.  Do not drive or use heavy machinery while taking prescription pain medicine.  Use proper lifting techniques. When you bend and lift, use positions that put less stress on your back: ? Thornton your knees. ? Keep the load close to your body. ? Avoid twisting.  Exercise regularly as told by your health care provider. Exercising helps your back heal faster and helps prevent back injuries by keeping muscles strong and flexible.  Work with a physical therapist to make a safe exercise program, as recommended by your health care provider. Do any exercises as told by your physical therapist. Lifestyle  Maintain a healthy weight. Extra weight puts stress on your back and makes it difficult to have good posture.  Avoid activities or situations that make you feel anxious or stressed. Stress and anxiety increase muscle tension and can make back pain worse. Learn ways to manage anxiety and stress, such as through exercise. General instructions  Sleep on a firm mattress in a comfortable position. Try lying on your side with your knees slightly bent. If you lie on your back, put a pillow under your knees.  Follow your treatment plan as told by your health  care provider. This may include: ? Cognitive or behavioral therapy. ? Acupuncture or massage therapy. ? Meditation or yoga. Contact a health care provider if:  You have pain that is not relieved with rest or medicine.  You have increasing pain going down into your legs or buttocks.  Your pain does not improve after 2 weeks.  You have pain at night.  You lose weight without trying.  You have a fever or chills. Get help right away if:  You develop new bowel or bladder control problems.  You have  unusual weakness or numbness in your arms or legs.  You develop nausea or vomiting.  You develop abdominal pain.  You feel faint. Summary  Acute back pain is sudden and usually short-lived.  Use proper lifting techniques. When you bend and lift, use positions that put less stress on your back.  Take over-the-counter and prescription medicines and apply heat or ice as directed by your health care provider. This information is not intended to replace advice given to you by your health care provider. Make sure you discuss any questions you have with your health care provider. Document Released: 02/23/2005 Document Revised: 09/30/2017 Document Reviewed: 10/07/2016 [employer] Interactive Patient Education  2019 [employer].

## 2018-03-11 ENCOUNTER — Other Ambulatory Visit: Payer: Self-pay

## 2018-03-11 ENCOUNTER — Ambulatory Visit: Payer: PRIVATE HEALTH INSURANCE | Admitting: Family Medicine

## 2018-03-11 ENCOUNTER — Encounter: Payer: Self-pay | Admitting: Family Medicine

## 2018-03-11 VITALS — BP 134/80 | HR 86 | Temp 98.6°F | Resp 16 | Ht <= 58 in | Wt 210.8 lb

## 2018-03-11 DIAGNOSIS — M5432 Sciatica, left side: Secondary | ICD-10-CM

## 2018-03-11 DIAGNOSIS — M7062 Trochanteric bursitis, left hip: Secondary | ICD-10-CM

## 2018-03-11 MED ORDER — PREDNISONE 20 MG PO TABS: ORAL_TABLET | ORAL | 0 refills | Status: DC

## 2018-03-11 NOTE — Patient Instructions (Signed)
Please follow up if symptoms do not improve or as needed.    Sciatica  Sciatica is pain, numbness, weakness, or tingling along the path of the sciatic nerve. The sciatic nerve starts in the lower back and runs down the back of each leg. The nerve controls the muscles in the lower leg and in the back of the knee. It also provides feeling (sensation) to the back of the thigh, the lower leg, and the sole of the foot. Sciatica is a symptom of another medical condition that pinches or puts pressure on the sciatic nerve. Generally, sciatica only affects one side of the body. Sciatica usually goes away on its own or with treatment. In some cases, sciatica may keep coming back (recur). What are the causes? This condition is caused by pressure on the sciatic nerve, or pinching of the sciatic nerve. This may be the result of:  A disk in between the bones of the spine (vertebrae) bulging out too far (herniated disk).  Age-related changes in the spinal disks (degenerative disk disease).  A pain disorder that affects a muscle in the buttock (piriformis syndrome).  Extra bone growth (bone spur) near the sciatic nerve.  An injury or break (fracture) of the pelvis.  Pregnancy.  Tumor (rare). What increases the risk? The following factors may make you more likely to develop this condition:  Playing sports that place pressure or stress on the spine, such as football or weight lifting.  Having poor strength and flexibility.  A history of back injury.  A history of back surgery.  Sitting for long periods of time.  Doing activities that involve repetitive bending or lifting.  Obesity. What are the signs or symptoms? Symptoms can vary from mild to very severe, and they may include:  Any of these problems in the lower back, leg, hip, or buttock: ? Mild tingling or dull aches. ? Burning sensations. ? Sharp pains.  Numbness in the back of the calf or the sole of the foot.  Leg  weakness.  Severe back pain that makes movement difficult. These symptoms may get worse when you cough, sneeze, or laugh, or when you sit or stand for long periods of time. Being overweight may also make symptoms worse. In some cases, symptoms may recur over time. How is this diagnosed? This condition may be diagnosed based on:  Your symptoms.  A physical exam. Your health care provider may ask you to do certain movements to check whether those movements trigger your symptoms.  You may have tests, including: ? Blood tests. ? X-rays. ? MRI. ? CT scan. How is this treated? In many cases, this condition improves on its own, without any treatment. However, treatment may include:  Reducing or modifying physical activity during periods of pain.  Exercising and stretching to strengthen your abdomen and improve the flexibility of your spine.  Icing and applying heat to the affected area.  Medicines that help: ? To relieve pain and swelling. ? To relax your muscles.  Injections of medicines that help to relieve pain, irritation, and inflammation around the sciatic nerve (steroids).  Surgery. Follow these instructions at home: Medicines  Take over-the-counter and prescription medicines only as told by your health care provider.  Do not drive or operate heavy machinery while taking prescription pain medicine. Managing pain  If directed, apply ice to the affected area. ? Put ice in a plastic bag. ? Place a towel between your skin and the bag. ? Leave the ice on for  20 minutes, 2-3 times a day.  After icing, apply heat to the affected area before you exercise or as often as told by your health care provider. Use the heat source that your health care provider recommends, such as a moist heat pack or a heating pad. ? Place a towel between your skin and the heat source. ? Leave the heat on for 20-30 minutes. ? Remove the heat if your skin turns bright red. This is especially important  if you are unable to feel pain, heat, or cold. You may have a greater risk of getting burned. Activity  Return to your normal activities as told by your health care provider. Ask your health care provider what activities are safe for you. ? Avoid activities that make your symptoms worse.  Take brief periods of rest throughout the day. Resting in a lying or standing position is usually better than sitting to rest. ? When you rest for longer periods, mix in some mild activity or stretching between periods of rest. This will help to prevent stiffness and pain. ? Avoid sitting for long periods of time without moving. Get up and move around at least one time each hour.  Exercise and stretch regularly, as told by your health care provider.  Do not lift anything that is heavier than 10 lb (4.5 kg) while you have symptoms of sciatica. When you do not have symptoms, you should still avoid heavy lifting, especially repetitive heavy lifting.  When you lift objects, always use proper lifting technique, which includes: ? Bending your knees. ? Keeping the load close to your body. ? Avoiding twisting. General instructions  Use good posture. ? Avoid leaning forward while sitting. ? Avoid hunching over while standing.  Maintain a healthy weight. Excess weight puts extra stress on your back and makes it difficult to maintain good posture.  Wear supportive, comfortable shoes. Avoid wearing high heels.  Avoid sleeping on a mattress that is too soft or too hard. A mattress that is firm enough to support your back when you sleep may help to reduce your pain.  Keep all follow-up visits as told by your health care provider. This is important. Contact a health care provider if:  You have pain that wakes you up when you are sleeping.  You have pain that gets worse when you lie down.  Your pain is worse than you have experienced in the past.  Your pain lasts longer than 4 weeks.  You experience  unexplained weight loss. Get help right away if:  You lose control of your bowel or bladder (incontinence).  You have: ? Weakness in your lower back, pelvis, buttocks, or legs that gets worse. ? Redness or swelling of your back. ? A burning sensation when you urinate. This information is not intended to replace advice given to you by your health care provider. Make sure you discuss any questions you have with your health care provider. Document Released: 02/17/2001 Document Revised: 07/30/2015 Document Reviewed: 11/02/2014 Elsevier Interactive Patient Education  2019 [employer].

## 2018-03-11 NOTE — Progress Notes (Signed)
Subjective  CC:  Chief Complaint  Patient presents with  . Hip Pain    Left hip.. started 1 1/2 weeks ago.. She has tried IBU and Liodcaine patch with minimal relief.. Pain radiates down leg    HPI: Sandra Lara is a 58 y.o. female who presents to the office today to address the problems listed above in the chief complaint.  Patient comes in for recurrent left-sided sciatica symptoms including buttock pain that radiates down to left ankle.  She also has some lateral hip pain.  No injury.  Pain started about a week and half ago.  She did see her work clinic yesterday.  I reviewed that note.  She started diclofenac.  Pain is mild to moderate.  No weakness in the leg at this time.  No low back pain.  He has had intermittent chronic symptoms like this in the past.  This current episode is similar in nature. Assessment  1. Sciatica of left side   2. Trochanteric bursitis of left hip      Plan   Sciatica and hip bursitis, recurrent: Start prednisone burst and then prednisone taper.  Heat, stretching exercises.  Ice to left hip bursa with stretching exercises demonstrated.  Follow-up in 2 to 4 weeks if not improving.  Discussed red flag symptoms.  No bowel or bladder issues or weakness.  Follow up: Return if symptoms worsen or fail to improve.  Visit date not found  No orders of the defined types were placed in this encounter.  Meds ordered this encounter  Medications  . predniSONE (DELTASONE) 20 MG tablet    Sig: Take 3 tabs daily for 5 days    Dispense:  15 tablet    Refill:  0      I reviewed the patients updated PMH, FH, and SocHx.    Patient Active Problem List   Diagnosis Date Noted  . DJD (degenerative joint disease), lumbar 08/26/2017  . Class 3 severe obesity due to excess calories without serious comorbidity with body mass index (BMI) of 40.0 to 44.9 in adult ([place]) 10/29/2016  . Seasonal allergic rhinitis due to pollen 05/21/2016  . Tubular adenoma of colon  11/26/2015  . Diverticulosis of large intestine 05/24/2015  . Gastroesophageal reflux disease without esophagitis 09/28/2014  . Greater trochanteric bursitis of left hip 09/28/2014  . Menopause syndrome 09/28/2014   Current Meds  Medication Sig  . albuterol (PROVENTIL HFA;VENTOLIN HFA) 108 (90 Base) MCG/ACT inhaler Inhale 2 puffs into the lungs every 4 (four) hours as needed.  . Ascorbic Acid (VITAMIN C) 100 MG tablet Take 100 mg by mouth daily.  . Azelastine HCl 137 MCG/SPRAY SOLN USE 1 SPRAY IN EACH NOSTRIL 2 TIMES DAILY AS DIRECTED  . Cholecalciferol (VITAMIN D3) 5000 units TABS Take by mouth daily.  . diclofenac (VOLTAREN) 75 MG EC tablet Take 1 tablet (75 mg total) by mouth 2 (two) times daily as needed for up to 14 days for moderate pain.  . Menthol, Topical Analgesic, (BIOFREEZE) 4 % GEL Apply 1 application topically 4 (four) times daily as needed for up to 7 days.  . Multiple Vitamins-Minerals (THERA-M) TABS Take 1 tablet by mouth daily.  Marland Kitchen omega-3 acid ethyl esters (LOVAZA) 1 G capsule Take by mouth 2 (two) times daily.  Marland Kitchen omeprazole (PRILOSEC) 20 MG capsule Take 1 capsule (20 mg total) by mouth daily.  Marland Kitchen SACCHAROMYCES BOULARDII PO Take by mouth.    Allergies: Patient is allergic to sulfamethoxazole-trimethoprim; doxycycline; erythromycin; achromycin [  tetracycline]; and macrolides and ketolides. Family History: Patient family history includes Arthritis in her mother; Breast cancer (age of onset: 72) in her mother; COPD in her mother; Cancer in her mother; Diabetes in her maternal grandfather; Hearing loss in her mother; Heart attack in her brother and maternal grandfather; Hyperlipidemia in her mother; Stroke in her maternal grandmother. Social History:  Patient  reports that she quit smoking about 6 years ago. She has never used smokeless tobacco. She reports current alcohol use of about 7.0 standard drinks of alcohol per week. She reports that she does not use drugs.  Review of  Systems: Constitutional: Negative for fever malaise or anorexia Cardiovascular: negative for chest pain Respiratory: negative for SOB or persistent cough Gastrointestinal: negative for abdominal pain  Objective  Vitals: BP 134/80   Pulse 86   Temp 98.6 F (37 C) (Oral)   Resp 16   Ht 4' 9.5" (1.461 m)   Wt 210 lb 12.8 oz (95.6 kg)   SpO2 98%   BMI 44.83 kg/m  General: no acute distress , A&Ox3 Appears comfortable Back: Full range of motion without pain.  Quad flexion normal bilaterally left sciatic notch tender left trochanteric bursa tender negative straight leg raise bilaterally +2 bilateral lower extremity pulses    Commons side effects, risks, benefits, and alternatives for medications and treatment plan prescribed today were discussed, and the patient expressed understanding of the given instructions. Patient is instructed to call or message via MyChart if he/she has any questions or concerns regarding our treatment plan. No barriers to understanding were identified. We discussed Red Flag symptoms and signs in detail. Patient expressed understanding regarding what to do in case of urgent or emergency type symptoms.   Medication list was reconciled, printed and provided to the patient in AVS. Patient instructions and summary information was reviewed with the patient as documented in the AVS. This note was prepared with assistance of Dragon voice recognition software. Occasional wrong-word or sound-a-like substitutions may have occurred due to the inherent limitations of voice recognition software

## 2018-03-22 ENCOUNTER — Encounter: Payer: Self-pay | Admitting: Registered Nurse

## 2018-03-22 ENCOUNTER — Ambulatory Visit: Payer: Self-pay | Admitting: Registered Nurse

## 2018-03-22 VITALS — BP 150/96 | HR 80 | Temp 98.5°F

## 2018-03-22 DIAGNOSIS — J206 Acute bronchitis due to rhinovirus: Secondary | ICD-10-CM

## 2018-03-22 MED ORDER — ALBUTEROL SULFATE 108 (90 BASE) MCG/ACT IN AEPB: 2.0000 | INHALATION_SPRAY | RESPIRATORY_TRACT | 1 refills | Status: DC | PRN

## 2018-03-22 MED ORDER — MONTELUKAST SODIUM 10 MG PO TABS: 10.0000 mg | ORAL_TABLET | Freq: Every day | ORAL | 3 refills | Status: DC

## 2018-03-22 NOTE — Progress Notes (Signed)
Subjective:    Patient ID: Rosalita Chessman, female    DOB: December 13, 1960, 58 y.o.   MRN: [id number]  57y/o Caucasian female pt c/o productive cough x5 days. +nasal and chest congestion. Denies sore throat, otalgia, sinus pain/pressure. Using Robitussin and Mucinex at home with some relief. Was using Albuterol inh in the beginning but it ran out. Needs refill. Felt it was helping when she was on it. Also just finished prednisone burst and taper 2 days ago 2/2 back pain.  Saw her PCM after [facility] visit 03/10/2018 and 03/11/2018 PCM sciatica.  Seems to get bad cough this time every year for quite a few years.  Hasn't tried singulair in the past. On azelastine nasal spray and nasal saline.  Back improved with prednisone but cough did not.     Review of Systems  Constitutional: Positive for activity change and fatigue. Negative for appetite change, chills, diaphoresis, fever and unexpected weight change.  HENT: Positive for congestion, postnasal drip and rhinorrhea. Negative for dental problem, drooling, ear discharge, ear pain, facial swelling, hearing loss, mouth sores, nosebleeds, sinus pressure, sinus pain, sneezing, sore throat, tinnitus, trouble swallowing and voice change.   Eyes: Negative for photophobia, pain, discharge, redness, itching and visual disturbance.  Respiratory: Positive for cough. Negative for choking, chest tightness, shortness of breath, wheezing and stridor.   Cardiovascular: Negative for chest pain, palpitations and leg swelling.  Gastrointestinal: Negative for abdominal distention, abdominal pain, blood in stool, constipation, diarrhea, nausea and vomiting.  Endocrine: Negative for cold intolerance and heat intolerance.  Genitourinary: Negative for difficulty urinating, dysuria and hematuria.  Musculoskeletal: Positive for back pain. Negative for arthralgias, gait problem, joint swelling, myalgias, neck pain and neck stiffness.  Skin: Negative for color change, pallor, rash  and wound.  Allergic/Immunologic: Positive for environmental allergies. Negative for food allergies.  Neurological: Negative for dizziness, tremors, seizures, syncope, facial asymmetry, speech difficulty, weakness, light-headedness, numbness and headaches.  Hematological: Negative for adenopathy. Does not bruise/bleed easily.  Psychiatric/Behavioral: Positive for sleep disturbance. Negative for agitation, behavioral problems and confusion.       Objective:   Physical Exam Vitals signs and nursing note reviewed.  Constitutional:      General: She is not in acute distress.    Appearance: She is well-developed and well-groomed. She is obese. She is ill-appearing. She is not toxic-appearing or diaphoretic.  HENT:     Head: Normocephalic and atraumatic.     Jaw: There is normal jaw occlusion. No trismus.     Right Ear: Hearing, ear canal and external ear normal. A middle ear effusion is present. There is no impacted cerumen.     Left Ear: Hearing, ear canal and external ear normal. A middle ear effusion is present. There is no impacted cerumen.     Nose: Mucosal edema, congestion and rhinorrhea present. No nasal deformity, septal deviation, laceration or nasal tenderness.     Right Turbinates: Enlarged and swollen.     Left Turbinates: Enlarged and swollen.     Right Sinus: No maxillary sinus tenderness or frontal sinus tenderness.     Left Sinus: No maxillary sinus tenderness or frontal sinus tenderness.     Mouth/Throat:     Lips: Pink. No lesions.     Mouth: Mucous membranes are moist. Mucous membranes are not pale, not dry and not cyanotic. No lacerations or oral lesions.     Dentition: Normal dentition. Does not have dentures. No dental caries or dental abscesses.     Tongue:  No lesions.     Pharynx: Uvula midline. Pharyngeal swelling and posterior oropharyngeal erythema present. No oropharyngeal exudate or uvula swelling.     Tonsils: No tonsillar exudate or tonsillar abscesses.  Swelling: 0 on the right. 0 on the left.     Comments: Bilateral allergic shiners; cobblestoning posterior pharynx; bilateral TMs air fluid level clear; nasal turbinates edema/erythema clear discharge Eyes:     General: Lids are normal. No visual field deficit or scleral icterus.       Right eye: No foreign body, discharge or hordeolum.        Left eye: No foreign body, discharge or hordeolum.     Extraocular Movements:     Right eye: Normal extraocular motion and no nystagmus.     Left eye: Normal extraocular motion and no nystagmus.     Conjunctiva/sclera: Conjunctivae normal.     Right eye: Right conjunctiva is not injected. No chemosis, exudate or hemorrhage.    Left eye: Left conjunctiva is not injected. No chemosis, exudate or hemorrhage.    Pupils: Pupils are equal, round, and reactive to light. Pupils are equal.     Right eye: Pupil is round and reactive.     Left eye: Pupil is round and reactive.  Neck:     Musculoskeletal: Normal range of motion and neck supple. Normal range of motion. No edema, erythema, neck rigidity, spinous process tenderness or muscular tenderness.     Thyroid: No thyroid mass or thyromegaly.     Trachea: Trachea and phonation normal. No tracheal tenderness or tracheal deviation.  Cardiovascular:     Rate and Rhythm: Normal rate and regular rhythm.     Chest Wall: PMI is not displaced.     Heart sounds: Normal heart sounds, S1 normal and S2 normal. No murmur. No friction rub. No gallop.   Pulmonary:     Effort: Pulmonary effort is normal. No accessory muscle usage or respiratory distress.     Breath sounds: Normal breath sounds and air entry. No stridor, decreased air movement or transmitted upper airway sounds. No decreased breath sounds, wheezing, rhonchi or rales.     Comments: Frequent nonproductive cough in exam room; spoke full sentences without difficulty Chest:     Chest wall: No tenderness.  Abdominal:     General: There is no distension.      Palpations: Abdomen is soft.  Musculoskeletal: Normal range of motion.        General: No tenderness.     Right shoulder: Normal.     Left shoulder: Normal.     Right elbow: Normal.    Left elbow: Normal.     Right hip: Normal.     Left hip: Normal.     Right knee: Normal.     Left knee: Normal.     Cervical back: Normal.     Thoracic back: Normal.     Lumbar back: Normal.     Right hand: Normal.     Left hand: Normal.     Right lower leg: No edema.     Left lower leg: No edema.  Lymphadenopathy:     Head:     Right side of head: No submental, submandibular, tonsillar, preauricular, posterior auricular or occipital adenopathy.     Left side of head: No submental, submandibular, tonsillar, preauricular, posterior auricular or occipital adenopathy.     Cervical: No cervical adenopathy.     Right cervical: No superficial, deep or posterior cervical adenopathy.    Left  cervical: No superficial, deep or posterior cervical adenopathy.  Skin:    General: Skin is warm and dry.     Capillary Refill: Capillary refill takes less than 2 seconds.     Coloration: Skin is not ashen, cyanotic, jaundiced, mottled, pale or sallow.     Findings: No abrasion, bruising, burn, ecchymosis, erythema, laceration, lesion, petechiae or rash.     Nails: There is no clubbing.   Neurological:     General: No focal deficit present.     Mental Status: She is alert and oriented to person, place, and time. Mental status is at baseline. She is not disoriented.     GCS: GCS eye subscore is 4. GCS verbal subscore is 5. GCS motor subscore is 6.     Cranial Nerves: Cranial nerves are intact. No cranial nerve deficit, dysarthria or facial asymmetry.     Sensory: Sensation is intact. No sensory deficit.     Motor: Motor function is intact. No weakness, tremor, atrophy, abnormal muscle tone or seizure activity.     Coordination: Coordination is intact. Coordination normal.     Gait: Gait is intact. Gait normal.      Comments: In/out of chair without difficulty; gait sure and steady in hallway  Psychiatric:        Speech: Speech normal.        Behavior: Behavior normal. Behavior is cooperative.        Thought Content: Thought content normal.        Judgment: Judgment normal.           Assessment & Plan:  A--acute bronchitis  P-Frequent cough dry harsh had blast prednisone from pcm and finished taper from me after blast dosing from PCM. Forgot she had tessalon pearles at home will restart tessalon 200mg  po TID prn cough Refilled albuterol ran out 1 day ago was helping for the two days she took it.  Albuterol MDI 93mcg 1-2 puffs po q4-6h prn protracted cough/wheeze #1 Rf1  Cough drops po q2h prn cough given 8 UD from clinic stock.   continue azelastine nasal spray and start singulair 10mg  po qhs #30 RF3 to her pharmacy of choice electronic rx.  Bronchitis simple, community acquired, may have started as viral (probably respiratory syncytial, parainfluenza, influenza, or adenovirus), but now evidence of acute purulent bronchitis with resultant bronchial edema and mucus formation.  Viruses are the most common cause of bronchial inflammation in otherwise healthy adults with acute bronchitis.  The appearance of sputum is not predictive of whether a bacterial infection is present.  Purulent sputum is most often caused by viral infections.  There are a small portion of those caused by non-viral agents being Mycoplama pneumonia.  Microscopic examination or C&S of sputum in the healthy adult with acute bronchitis is generally not helpful (usually negative or normal respiratory flora) other considerations being cough from upper respiratory tract infections, sinusitis or allergic syndromes (mild asthma or viral pneumonia).  Differential Diagnoses:  reactive airway disease (asthma, allergic aspergillosis (eosinophilia), chronic bronchitis, respiratory infection (sinusitis, common cold, pneumonia), congestive heart failure,  reflux esophagitis, bronchogenic tumor, aspiration syndromes and/or exposure to pulmonary irritants/smoke. Without high fever, severe dyspnea, lack of physical findings or other risk factors, I will hold on a chest radiograph and CBC at this time.  I discussed that approximately 50% of patients with acute bronchitis have a cough that lasts up to three weeks, and 25% for over a month.  Tylenol 500mg  one to two tablets every four to  six hours as needed for fever or myalgias.  No aspirin. Exitcare handout on bronchitis and inhaler use.  ER if hemopthysis, SOB, worst chest pain of life.   Patient instructed to follow up in one week or sooner if symptoms worsen.  Patient verbalized agreement and understanding of treatment plan.  P2:  hand washing and cover cough  Patient may use normal saline nasal spray 2 sprays each nostril q2h wa as needed.   Patient denied personal or family history of ENT cancer.  Avoid triggers if possible.  Shower prior to bedtime if exposed to triggers.  If allergic dust/dust mites recommend mattress/pillow covers/encasements; washing linens, vacuuming, sweeping, dusting weekly.  Call or return to clinic as needed if these symptoms worsen or fail to improve as anticipated.   Exitcare handout on non allergic and allergic rhinitis and sinus rinse.  Patient verbalized understanding of instructions, agreed with plan of care and had no further questions at this time.  P2:  Avoidance and hand washing.

## 2018-03-22 NOTE — Patient Instructions (Addendum)
Viral Respiratory Infection A respiratory infection is an illness that affects part of the respiratory system, such as the lungs, nose, or throat. A respiratory infection that is caused by a virus is called a viral respiratory infection. Common types of viral respiratory infections include:  A cold.  The flu (influenza).  A respiratory syncytial virus (RSV) infection. What are the causes? This condition is caused by a virus. What are the signs or symptoms? Symptoms of this condition include:  A stuffy or runny nose.  Yellow or green nasal discharge.  A cough.  Sneezing.  Fatigue.  Achy muscles.  A sore throat.  Sweating or chills.  A fever.  A headache. How is this diagnosed? This condition may be diagnosed based on:  Your symptoms.  A physical exam.  Testing of nasal swabs. How is this treated? This condition may be treated with medicines, such as:  Antiviral medicine. This may shorten the length of time a person has symptoms.  Expectorants. These make it easier to cough up mucus.  Decongestant nasal sprays.  Acetaminophen or NSAIDs to relieve fever and pain. Antibiotic medicines are not prescribed for viral infections. This is because antibiotics are designed to kill bacteria. They are not effective against viruses. Follow these instructions at home:  Managing pain and congestion  Take over-the-counter and prescription medicines only as told by your health care provider.  If you have a sore throat, gargle with a salt-water mixture 3-4 times a day or as needed. To make a salt-water mixture, completely dissolve -1 tsp of salt in 1 cup of warm water.  Use nose drops made from salt water to ease congestion and soften raw skin around your nose.  Drink enough fluid to keep your urine pale yellow. This helps prevent dehydration and helps loosen up mucus. General instructions  Rest as much as possible.  Do not drink alcohol.  Do not use any products  that contain nicotine or tobacco, such as cigarettes and e-cigarettes. If you need help quitting, ask your health care provider.  Keep all follow-up visits as told by your health care provider. This is important. How is this prevented?   Get an annual flu shot. You may get the flu shot in late summer, fall, or winter. Ask your health care provider when you should get your flu shot.  Avoid exposing others to your respiratory infection. ? Stay home from work or school as told by your health care provider. ? Wash your hands with soap and water often, especially after you cough or sneeze. If soap and water are not available, use alcohol-based hand sanitizer.  Avoid contact with people who are sick during cold and flu season. This is generally fall and winter. Contact a health care provider if:  Your symptoms last for 10 days or longer.  Your symptoms get worse over time.  You have a fever.  You have severe sinus pain in your face or forehead.  The glands in your jaw or neck become very swollen. Get help right away if you:  Feel pain or pressure in your chest.  Have shortness of breath.  Faint or feel like you will faint.  Have severe and persistent vomiting.  Feel confused or disoriented. Summary  A respiratory infection is an illness that affects part of the respiratory system, such as the lungs, nose, or throat. A respiratory infection that is caused by a virus is called a viral respiratory infection.  Common types of viral respiratory infections are a   cold, influenza, and respiratory syncytial virus (RSV) infection.  Symptoms of this condition include a stuffy or runny nose, cough, sneezing, fatigue, achy muscles, sore throat, and fevers or chills.  Antibiotic medicines are not prescribed for viral infections. This is because antibiotics are designed to kill bacteria. They are not effective against viruses. This information is not intended to replace advice given to you by  your health care provider. Make sure you discuss any questions you have with your health care provider. Document Released: 12/03/2004 Document Revised: 04/05/2017 Document Reviewed: 04/05/2017 Elsevier Interactive Patient Education  2019 [employer] Cough, Adult  Coughing is a reflex that clears your throat and your airways. Coughing helps to heal and protect your lungs. It is normal to cough occasionally, but a cough that happens with other symptoms or lasts a long time may be a sign of a condition that needs treatment. A cough may last only 2-3 weeks (acute), or it may last longer than 8 weeks (chronic). What are the causes? Coughing is commonly caused by:  Breathing in substances that irritate your lungs.  A viral or bacterial respiratory infection.  Allergies.  Asthma.  Postnasal drip.  Smoking.  Acid backing up from the stomach into the esophagus (gastroesophageal reflux).  Certain medicines.  Chronic lung problems, including COPD (or rarely, lung cancer).  Other medical conditions such as heart failure. Follow these instructions at home: Pay attention to any changes in your symptoms. Take these actions to help with your discomfort:  Take medicines only as told by your health care provider. ? If you were prescribed an antibiotic medicine, take it as told by your health care provider. Do not stop taking the antibiotic even if you start to feel better. ? Talk with your health care provider before you take a cough suppressant medicine.  Drink enough fluid to keep your urine clear or pale yellow.  If the air is dry, use a cold steam vaporizer or humidifier in your bedroom or your home to help loosen secretions.  Avoid anything that causes you to cough at work or at home.  If your cough is worse at night, try sleeping in a semi-upright position.  Avoid cigarette smoke. If you smoke, quit smoking. If you need help quitting, ask your health care provider.  Avoid  caffeine.  Avoid alcohol.  Rest as needed. Contact a health care provider if:  You have new symptoms.  You cough up pus.  Your cough does not get better after 2-3 weeks, or your cough gets worse.  You cannot control your cough with suppressant medicines and you are losing sleep.  You develop pain that is getting worse or pain that is not controlled with pain medicines.  You have a fever.  You have unexplained weight loss.  You have night sweats. Get help right away if:  You cough up blood.  You have difficulty breathing.  Your heartbeat is very fast. This information is not intended to replace advice given to you by your health care provider. Make sure you discuss any questions you have with your health care provider. Document Released: 08/22/2010 Document Revised: 08/01/2015 Document Reviewed: 05/02/2014 Elsevier Interactive Patient Education  2019 [employer]. How to Use a Metered Dose Inhaler A metered dose inhaler is a handheld device for taking medicine that must be breathed into the lungs (inhaled). The device can be used to deliver a variety of inhaled medicines, including:  Quick relief or rescue medicines, such as bronchodilators.  Controller medicines, such  as corticosteroids. The medicine is delivered by pushing down on a metal canister to release a preset amount of spray and medicine. Each device contains the amount of medicine that is needed for a preset number of uses (inhalations). Your health care provider may recommend that you use a spacer with your inhaler to help you take the medicine more effectively. A spacer is a plastic tube with a mouthpiece on one end and an opening that connects to the inhaler on the other end. A spacer holds the medicine in a tube for a short time, which allows you to inhale more medicine. What are the risks? If you do not use your inhaler correctly, medicine might not reach your lungs to help you breathe. Inhaler medicine can  cause side effects, such as:  Mouth or throat infection.  Cough.  Hoarseness.  Headache.  Nausea and vomiting.  Lung infection (pneumonia) in people who have a lung condition called COPD. How to use a metered dose inhaler without a spacer  1. Remove the cap from the inhaler. 2. If you are using the inhaler for the first time, shake it for 5 seconds, turn it away from your face, then release 4 puffs into the air. This is called priming. 3. Shake the inhaler for 5 seconds. 4. Position the inhaler so the top of the canister faces up. 5. Put your index finger on the top of the medicine canister. Support the bottom of the inhaler with your thumb. 6. Breathe out normally and as completely as possible, away from the inhaler. 7. Either place the inhaler between your teeth and close your lips tightly around the mouthpiece, or hold the inhaler 1-2 inches (2.5-5 cm) away from your open mouth. Keep your tongue down out of the way. If you are unsure which technique to use, ask your health care provider. 8. Press the canister down with your index finger to release the medicine, then inhale deeply and slowly through your mouth (not your nose) until your lungs are completely filled. Inhaling should take 4-6 seconds. 9. Hold the medicine in your lungs for 5-10 seconds (10 seconds is best). This helps the medicine get into the small airways of your lungs. 10. With your lips in a tight circle (pursed), breathe out slowly. 11. Repeat steps 3-10 until you have taken the number of puffs that your health care provider directed. Wait about 1 minute between puffs or as directed. 12. Put the cap on the inhaler. 13. If you are using a steroid inhaler, rinse your mouth with water, gargle, and spit out the water. Do not swallow the water. How to use a metered dose inhaler with a spacer  1. Remove the cap from the inhaler. 2. If you are using the inhaler for the first time, shake it for 5 seconds, turn it away from  your face, then release 4 puffs into the air. This is called priming. 3. Shake the inhaler for 5 seconds. 4. Place the open end of the spacer onto the inhaler mouthpiece. 5. Position the inhaler so the top of the canister faces up and the spacer mouthpiece faces you. 6. Put your index finger on the top of the medicine canister. Support the bottom of the inhaler and the spacer with your thumb. 7. Breathe out normally and as completely as possible, away from the spacer. 8. Place the spacer between your teeth and close your lips tightly around it. Keep your tongue down out of the way. 9. Press the canister  down with your index finger to release the medicine, then inhale deeply and slowly through your mouth (not your nose) until your lungs are completely filled. Inhaling should take 4-6 seconds. 10. Hold the medicine in your lungs for 5-10 seconds (10 seconds is best). This helps the medicine get into the small airways of your lungs. 11. With your lips in a tight circle (pursed), breathe out slowly. 12. Repeat steps 3-11 until you have taken the number of puffs that your health care provider directed. Wait about 1 minute between puffs or as directed. 13. Remove the spacer from the inhaler and put the cap on the inhaler. 14. If you are using a steroid inhaler, rinse your mouth with water, gargle, and spit out the water. Do not swallow the water. Follow these instructions at home:  Take your inhaled medicine only as told by your health care provider. Do not use the inhaler more than directed by your health care provider.  Keep all follow-up visits as told by your health care provider. This is important.  If your inhaler has a counter, you can check it to determine how full your inhaler is. If your inhaler does not have a counter, ask your health care provider when you will need to refill your inhaler and write the refill date on a calendar or on your inhaler canister. Note that you cannot know when an  inhaler is empty by shaking it.  Follow directions on the package insert for care and cleaning of your inhaler and spacer. Contact a health care provider if:  Symptoms are only partially relieved with your inhaler.  You are having trouble using your inhaler.  You have an increase in phlegm.  You have headaches. Get help right away if:  You feel little or no relief after using your inhaler.  You have dizziness.  You have a fast heart rate.  You have chills or a fever.  You have night sweats.  There is blood in your phlegm. Summary  A metered dose inhaler is a handheld device for taking medicine that must be breathed into the lungs (inhaled).  The medicine is delivered by pushing down on a metal canister to release a preset amount of spray and medicine.  Each device contains the amount of medicine that is needed for a preset number of uses (inhalations). This information is not intended to replace advice given to you by your health care provider. Make sure you discuss any questions you have with your health care provider. Document Released: 02/23/2005 Document Revised: 09/14/2016 Document Reviewed: 01/14/2016 Elsevier Interactive Patient Education  2019 [employer]. Allergic Rhinitis, Adult Allergic rhinitis is an allergic reaction that affects the mucous membrane inside the nose. It causes sneezing, a runny or stuffy nose, and the feeling of mucus going down the back of the throat (postnasal drip). Allergic rhinitis can be mild to severe. There are two types of allergic rhinitis:  Seasonal. This type is also called hay fever. It happens only during certain seasons.  Perennial. This type can happen at any time of the year. What are the causes? This condition happens when the body's defense system (immune system) responds to certain harmless substances called allergens as though they were germs.  Seasonal allergic rhinitis is triggered by pollen, which can come from  grasses, trees, and weeds. Perennial allergic rhinitis may be caused by:  House dust mites.  Pet dander.  Mold spores. What are the signs or symptoms? Symptoms of this condition include:  Sneezing.  Runny or stuffy nose (nasal congestion).  Postnasal drip.  Itchy nose.  Tearing of the eyes.  Trouble sleeping.  Daytime sleepiness. How is this diagnosed? This condition may be diagnosed based on:  Your medical history.  A physical exam.  Tests to check for related conditions, such as: ? Asthma. ? Pink eye. ? Ear infection. ? Upper respiratory infection.  Tests to find out which allergens trigger your symptoms. These may include skin or blood tests. How is this treated? There is no cure for this condition, but treatment can help control symptoms. Treatment may include:  Taking medicines that block allergy symptoms, such as antihistamines. Medicine may be given as a shot, nasal spray, or pill.  Avoiding the allergen.  Desensitization. This treatment involves getting ongoing shots until your body becomes less sensitive to the allergen. This treatment may be done if other treatments do not help.  If taking medicine and avoiding the allergen does not work, new, stronger medicines may be prescribed. Follow these instructions at home:  Find out what you are allergic to. Common allergens include smoke, dust, and pollen.  Avoid the things you are allergic to. These are some things you can do to help avoid allergens: ? Replace carpet with wood, tile, or vinyl flooring. Carpet can trap dander and dust. ? Do not smoke. Do not allow smoking in your home. ? Change your heating and air conditioning filter at least once a month. ? During allergy season:  Keep windows closed as much as possible.  Plan outdoor activities when pollen counts are lowest. This is usually during the evening hours.  When coming indoors, change clothing and shower before sitting on furniture or  bedding.  Take over-the-counter and prescription medicines only as told by your health care provider.  Keep all follow-up visits as told by your health care provider. This is important. Contact a health care provider if:  You have a fever.  You develop a persistent cough.  You make whistling sounds when you breathe (you wheeze).  Your symptoms interfere with your normal daily activities. Get help right away if:  You have shortness of breath. Summary  This condition can be managed by taking medicines as directed and avoiding allergens.  Contact your health care provider if you develop a persistent cough or fever.  During allergy season, keep windows closed as much as possible. This information is not intended to replace advice given to you by your health care provider. Make sure you discuss any questions you have with your health care provider. Document Released: 11/18/2000 Document Revised: 04/02/2016 Document Reviewed: 04/02/2016 Elsevier Interactive Patient Education  2019 [employer]  Nonallergic Rhinitis Nonallergic rhinitis is a condition that causes symptoms that affect the nose, such as a runny nose and a stuffed-up nose (nasal congestion) that can make it hard to breathe through the nose. This condition is different from having an allergy (allergic rhinitis). Allergic rhinitis occurs when the body's defense system (immune system) reacts to a substance that you are allergic to (allergen), such as pollen, pet dander, mold, or dust. Nonallergic rhinitis has many similar symptoms, but it is not caused by allergens. Nonallergic rhinitis can be a short-term or long-term problem. What are the causes? This condition can be caused by many different things. Some common types of nonallergic rhinitis include: Infectious rhinitis  This is usually due to an infection in the upper respiratory tract. Vasomotor rhinitis  This is the most common type of long-term nonallergic  rhinitis.  It is caused by too much blood flow through the nose, which makes the tissue inside of the nose swell.  Symptoms are often triggered by strong odors, cold air, stress, drinking alcohol, cigarette smoke, or changes in the weather. Occupational rhinitis  This type is caused by triggers in the workplace, such as chemicals, dusts, animal dander, or air pollution. Hormonal rhinitis  This type occurs in women as a result of an increase in the female hormone estrogen.  It may occur during pregnancy, puberty, and menstrual cycles.  Symptoms improve when estrogen levels drop. Drug-induced rhinitis Several drugs can cause nonallergic rhinitis, including:  Medicines that are used to treat high blood pressure, heart disease, and Parkinson disease.  Aspirin and NSAIDs.  Over-the-counter nasal decongestant sprays. These can cause a type of nonallergic rhinitis (rhinitis medicamentosa) when they are used for more than a few days. Nonallergic rhinitis with eosinophilia syndrome (NARES)  This type is caused by having too much of a certain type of white blood cell (eosinophil). Nonallergic rhinitis can also be caused by a reaction to eating hot or spicy foods. This does not usually cause long-term symptoms. In some cases, the cause of nonallergic rhinitis is not known. What increases the risk? You are more likely to develop this condition if:  You are 9-61 years of age.  You are a woman. Women are twice as likely to have this condition. What are the signs or symptoms? Common symptoms of this condition include:  Nasal congestion.  Runny nose.  The feeling of mucus going down the back of the throat (postnasal drip).  Trouble sleeping at night and daytime sleepiness. Less common symptoms include:  Sneezing.  Coughing.  Itchy nose.  Bloodshot eyes. How is this diagnosed? This condition may be diagnosed based on:  Your symptoms and medical history.  A physical  exam.  Allergy testing to rule out allergic rhinitis. You may have skin tests or blood tests. In some cases, the health care provider may take a swab of nasal secretions to look for an increased number of eosinophils. This would be done to confirm a diagnosis of NARES. How is this treated? Treatment for this condition depends on the cause. No single treatment works for everyone. Work with your health care provider to find the best treatment for you. Treatment may include:  Avoiding the things that trigger your symptoms.  Using medicines to relieve congestion, such as: ? Steroid nasal spray. There are many types. You may need to try a few to find out which one works best. ? Decongestant medicine. This may be an oral medicine or a nasal spray. These medicines are only used for a short time.  Using medicines to relieve a runny nose. These may include antihistamine medicines or anticholinergic nasal sprays.  Surgery to remove tissue from inside the nose may be needed in severe cases if the condition has not improved after 6-12 months of medical treatment. Follow these instructions at home:  Take or use over-the-counter and prescription medicines only as told by your health care provider. Do not stop using your medicine even if you start to feel better.  Use salt-water (saline) rinses or other solutions (nasal washes or irrigations) to wash or rinse out the inside of your nose as told by your health care provider.  Do not take NSAIDs or medicines that contain aspirin if they make your symptoms worse.  Do not drink alcohol if it makes your symptoms worse.  Do not use any tobacco products,  such as cigarettes, chewing tobacco, and e-cigarettes. If you need help quitting, ask your health care provider.  Avoid secondhand smoke.  Get some exercise every day. Exercise may help reduce symptoms of nonallergic rhinitis for some people. Ask your health care provider how much exercise and what types of  exercise are safe for you.  Sleep with the head of your bed raised (elevated). This may reduce nighttime nasal congestion.  Keep all follow-up visits as told by your health care provider. This is important. Contact a health care provider if:  You have a fever.  Your symptoms are getting worse at home.  Your symptoms are not responding to medicine.  You develop new symptoms, especially a headache or nosebleed. This information is not intended to replace advice given to you by your health care provider. Make sure you discuss any questions you have with your health care provider. Document Released: 06/17/2015 Document Revised: 08/01/2015 Document Reviewed: 05/16/2015 Elsevier Interactive Patient Education  2019 [employer].  How to Perform a Sinus Rinse A sinus rinse is a home treatment that is used to rinse your sinuses with a sterile mixture of salt and water (saline solution). Sinuses are air-filled spaces in your skull behind the bones of your face and forehead that open into your nasal cavity. A sinus rinse can help to clear mucus, dirt, dust, or pollen from your nasal cavity. You may do a sinus rinse when you have a cold, a virus, nasal allergy symptoms, a sinus infection, or stuffiness in your nose or sinuses. Talk with your health care provider about whether a sinus rinse might help you. What are the risks? A sinus rinse is generally safe and effective. However, there are a few risks, which include:  A burning sensation in your sinuses. This may happen if you do not make the saline solution as directed. Be sure to follow all directions when making the saline solution.  Nasal irritation.  Infection from contaminated water. This is rare, but possible. Do not do a sinus rinse if you have had ear or nasal surgery, ear infection, or blocked ears. Supplies needed:  Saline solution or powder.  Distilled or sterile water may be needed to mix with saline powder. ? You may use boiled  and cooled tap water. Boil tap water for 5 minutes; cool until it is lukewarm. Use within 24 hours. ? Do not use regular tap water to mix with the saline solution.  Neti pot or nasal rinse bottle. These supplies release the saline solution into your nose and through your sinuses. Neti pots and nasal rinse bottles can be purchased at [employer], a health food store, or online. How to perform a sinus rinse  14. Wash your hands with soap and water. 75. Wash your device according to the directions that came with the product and then dry it. 16. Use the solution that comes with your product or one that is sold separately in stores. Follow the mixing directions on the package if you need to mix with sterile or distilled water. 17. Fill the device with the amount of saline solution noted in the device instructions. 18. Stand over a sink and tilt your head sideways over the sink. 19. Place the spout of the device in your upper nostril (the one closer to the ceiling). 20. Gently pour or squeeze the saline solution into your nasal cavity. The liquid should drain out from the lower nostril if you are not too congested. 21. While rinsing, breathe  through your open mouth. 22. Gently blow your nose to clear any mucus and rinse solution. Blowing too hard may cause ear pain. 23. Repeat in your other nostril. 24. Clean and rinse your device with clean water and then air-dry it. Talk with your health care provider or pharmacist if you have questions about how to do a sinus rinse. Summary  A sinus rinse is a home treatment that is used to rinse your sinuses with a sterile mixture of salt and water (saline solution).  A sinus rinse is generally safe and effective. Follow all instructions carefully.  Before doing a sinus rinse, talk with your health care provider about whether it would be helpful for you. This information is not intended to replace advice given to you by your health care provider. Make  sure you discuss any questions you have with your health care provider. Document Released: 09/20/2013 Document Revised: 12/21/2016 Document Reviewed: 12/21/2016 Elsevier Interactive Patient Education  2019 [employer].

## 2018-03-25 ENCOUNTER — Ambulatory Visit: Payer: Self-pay | Admitting: Family Medicine

## 2018-03-29 ENCOUNTER — Other Ambulatory Visit: Payer: Self-pay | Admitting: *Deleted

## 2018-03-29 MED ORDER — ALBUTEROL SULFATE HFA 108 (90 BASE) MCG/ACT IN AERS: 2.0000 | INHALATION_SPRAY | Freq: Four times a day (QID) | RESPIRATORY_TRACT | 1 refills | Status: DC | PRN

## 2018-03-29 NOTE — Telephone Encounter (Signed)
Pt reported feeling as though her current albuterol inh is not working as well as ones she has had previously. Requesting the "old school albuterol inh that isn't so dry." Advised pt that the albuterol that was originally ordered is the only one covered by her insurance. Pt verbalizes understanding and sts she is willing to pay out of pocket. New order placed for "non-reimbursable" albuterol inh, sent to pharmacy of choice, and goodrx coupon provided to pt.

## 2018-04-21 ENCOUNTER — Ambulatory Visit: Payer: Self-pay | Admitting: *Deleted

## 2018-04-21 VITALS — BP 150/82 | HR 73

## 2018-04-21 DIAGNOSIS — R030 Elevated blood-pressure reading, without diagnosis of hypertension: Secondary | ICD-10-CM

## 2018-04-21 NOTE — Progress Notes (Signed)
BP check. Bronchitis last month, was elevated. Recheck at MD office, even higher. On month later today, improved but still elevated. Advised to recheck in clinic in 2 weeks.

## 2018-05-13 ENCOUNTER — Other Ambulatory Visit: Payer: Self-pay

## 2018-05-13 ENCOUNTER — Ambulatory Visit: Payer: PRIVATE HEALTH INSURANCE | Admitting: Family Medicine

## 2018-05-13 ENCOUNTER — Encounter: Payer: Self-pay | Admitting: Family Medicine

## 2018-05-13 VITALS — BP 128/82 | HR 90 | Temp 98.1°F | Resp 15 | Ht <= 58 in | Wt 216.2 lb

## 2018-05-13 DIAGNOSIS — M654 Radial styloid tenosynovitis [de Quervain]: Secondary | ICD-10-CM | POA: Diagnosis not present

## 2018-05-13 DIAGNOSIS — M7712 Lateral epicondylitis, left elbow: Secondary | ICD-10-CM

## 2018-05-13 MED ORDER — TRIAMCINOLONE ACETONIDE 40 MG/ML IJ SUSP
20.0000 mg | Freq: Once | INTRAMUSCULAR | Status: AC
  Administered 2018-05-13: 20 mg via INTRAMUSCULAR

## 2018-05-13 NOTE — Progress Notes (Signed)
Subjective  CC:  Chief Complaint  Patient presents with  . Arm Pain    x 2 weeks    HPI: Sandra Lara is a 58 y.o. female who presents to the office today to address the problems listed above in the chief complaint.  Sandra Lara presents due to recurrent pain in her left wrist.  She was diagnosed with decor veins tenosynovitis on the left back in August.  She failed conservative treatment and ended up responding to a steroid injection.  Over the last 2 weeks same pain recurred.  She has been straining that left wrist hand when lifting her mother who is actively dying and under hospice care.  She denies paresthesias.  She is used a little bit of Advil.  has not been using the splint  New problem, left elbow pain, worse with certain movements including lifting.  No trauma.  No swelling.  She has not tried any pain medicines. Assessment  1. De Quervain's tenosynovitis, left   2. Epicondylitis, lateral, left      Plan   Overuse tenosynovitis and epicondylitis: Treated with steroid injections today.  Routine instructions given.  Ice, rest and splinting advised.  Follow up: Return if symptoms worsen or fail to improve.  Visit date not found  No orders of the defined types were placed in this encounter.  Meds ordered this encounter  Medications  . triamcinolone acetonide (KENALOG-40) injection 20 mg      I reviewed the patients updated PMH, FH, and SocHx.    Patient Active Problem List   Diagnosis Date Noted  . DJD (degenerative joint disease), lumbar 08/26/2017  . Class 3 severe obesity due to excess calories without serious comorbidity with body mass index (BMI) of 40.0 to 44.9 in adult ([place]) 10/29/2016  . Seasonal allergic rhinitis due to pollen 05/21/2016  . Tubular adenoma of colon 11/26/2015  . Diverticulosis of large intestine 05/24/2015  . Gastroesophageal reflux disease without esophagitis 09/28/2014  . Greater trochanteric bursitis of left hip 09/28/2014  .  Menopause syndrome 09/28/2014   Current Meds  Medication Sig  . albuterol (PROVENTIL HFA;VENTOLIN HFA) 108 (90 Base) MCG/ACT inhaler Inhale 2 puffs into the lungs every 4 (four) hours as needed.  Marland Kitchen albuterol (PROVENTIL HFA;VENTOLIN HFA) 108 (90 Base) MCG/ACT inhaler Inhale 2 puffs into the lungs every 6 (six) hours as needed for wheezing or shortness of breath.  . Albuterol Sulfate 108 (90 Base) MCG/ACT AEPB Inhale 2 puffs into the lungs every 4 (four) hours as needed.  Marland Kitchen amoxicillin-clavulanate (AUGMENTIN) 875-125 MG tablet TAKE 1 TABLET BY MOUTH 2 TIMES DAILY FOR 10 DAYS TABLET WITH FOOD  . Ascorbic Acid (VITAMIN C) 100 MG tablet Take 100 mg by mouth daily.  . Azelastine HCl 137 MCG/SPRAY SOLN USE 1 SPRAY IN EACH NOSTRIL 2 TIMES DAILY AS DIRECTED  . benzonatate (TESSALON) 200 MG capsule TAKE 1 CAPSULE BY MOUTH 3 TIMES DAILY AS NEEDED FOR cough  . Cholecalciferol (VITAMIN D3) 5000 units TABS Take by mouth daily.  . montelukast (SINGULAIR) 10 MG tablet Take 1 tablet (10 mg total) by mouth at bedtime.  . Multiple Vitamins-Minerals (THERA-M) TABS Take 1 tablet by mouth daily.  Marland Kitchen omega-3 acid ethyl esters (LOVAZA) 1 G capsule Take by mouth 2 (two) times daily.  Marland Kitchen omeprazole (PRILOSEC) 20 MG capsule Take 1 capsule (20 mg total) by mouth daily.  . predniSONE (DELTASONE) 10 MG tablet TAKE SIX TABLETS BY MOUTH EVERY DAY FOR 4 DAYS THEN 4 TABLETS EVERY  DAY FOR 4 DAYS THEN 2 TABLETS EVERY DAY FOR 4 DAYS  . promethazine-dextromethorphan (PROMETHAZINE-DM) 6.25-15 MG/5ML syrup Take 5 mLs by mouth 4 (four) times a day as needed for up to 7 days.  Marland Kitchen SACCHAROMYCES BOULARDII PO Take by mouth.    Allergies: Patient is allergic to sulfamethoxazole-trimethoprim; doxycycline; erythromycin; achromycin [tetracycline]; and macrolides and ketolides. Family History: Patient family history includes Arthritis in her mother; Breast cancer (age of onset: 34) in her mother; COPD in her mother; Cancer in her mother;  Diabetes in her maternal grandfather; Hearing loss in her mother; Heart attack in her brother and maternal grandfather; Hyperlipidemia in her mother; Stroke in her maternal grandmother. Social History:  Patient  reports that she quit smoking about 6 years ago. She has never used smokeless tobacco. She reports current alcohol use of about 7.0 standard drinks of alcohol per week. She reports that she does not use drugs.  Review of Systems: Constitutional: Negative for fever malaise or anorexia Cardiovascular: negative for chest pain Respiratory: negative for SOB or persistent cough Gastrointestinal: negative for abdominal pain  Objective  Vitals: BP 128/82   Pulse 90   Temp 98.1 F (36.7 C) (Oral)   Resp 15   Ht 4\' 10"  (1.473 m)   Wt 216 lb 3.2 oz (98.1 kg)   SpO2 96%   BMI 45.19 kg/m  General: no acute distress , A&Ox3 Left upper extremity: Elbow with full range of motion, tenderness over lateral epicondyle, pain with resisted supination, no tenderness medially, normal olecranon.  Left wrist with full range of motion, positive Finkelstein's, negative Phalen's, normal grip strength.  Lateral epicondylitis Steroid Injection Procedure Note  Pre-operative Diagnosis: left lateral epicondylitis  Post-operative Diagnosis: same  Indications: pain and treatment   Anesthesia:cold spray  Procedure Details   Verbal consent was obtained for the procedure. Universal time out done. The point of maximum tenderness was identified and marked. The skin prepped with alcohol and cold spray was used for anesthesia.  A 1" 25ga needle was advanced into the skin over the lateral epicondyle and proximal ligament and  0.25 ml of triamcinolone (KENALOG) 40mg /ml with 0.31ml of lidocaine 1% was injected.   Complications:  None; patient tolerated the procedure well.  Dequervain's tenosynovitis Steroid Injection Procedure Note  Pre-operative Diagnosis: De Quervain's Tenosynovitis, left  Post-operative  Diagnosis: same  Indications: pain and treatment   Anesthesia:cold spray  Procedure Details   Verbal consent was obtained for the procedure. Universal time out done. The area between the affected extensor tendons of the thumb were palpated and marked.  The skin prepped with alcohol and cold spray was used for anesthesia.  A 1 1/2" 25ga needle was advanced into the skin over the affected area of the extensor tendons and  0.25 ml of triamcinolone (KENALOG) 40mg /ml with 0.22ml of lidocaine 1% was injected.   Complications:  None; patient tolerated the procedure well.     Commons side effects, risks, benefits, and alternatives for medications and treatment plan prescribed today were discussed, and the patient expressed understanding of the given instructions. Patient is instructed to call or message via MyChart if he/she has any questions or concerns regarding our treatment plan. No barriers to understanding were identified. We discussed Red Flag symptoms and signs in detail. Patient expressed understanding regarding what to do in case of urgent or emergency type symptoms.   Medication list was reconciled, printed and provided to the patient in AVS. Patient instructions and summary information was reviewed with the patient  as documented in the AVS. This note was prepared with assistance of Dragon voice recognition software. Occasional wrong-word or sound-a-like substitutions may have occurred due to the inherent limitations of voice recognition software

## 2018-05-13 NOTE — Patient Instructions (Signed)
Please follow up if symptoms do not improve or as needed.   Ice and splint your thumb and elbow as we discussed.   You had a steroid injection today.   Things to be aware of after this injection are listed below:  You may experience no significant improvement or even a slight worsening in your symptoms during the first 24 to 48 hours.  After that we expect your symptoms to improve gradually over the next 2 weeks for the medicine to have its maximal effect.  You should continue to have improvement out to 6 weeks after your injection.  I recommend icing the site of the injection for 20 minutes  1-2 times the day of your injection  You may shower but no swimming, tub bath or Jacuzzi for 24 hours.  If your bandage falls off this does not need to be replaced.  It is appropriate to remove the bandage after 4 hours.  You may resume light activities as tolerated.     POSSIBLE PROCEDURE SIDE EFFECTS: The side effects of the injection are usually fairly minimal however if you may experience some of the following side effects that are usually self-limited and will is off on their own.  If you are concerned please feel free to call the office with questions:             Increased numbness or tingling             Nausea or vomiting             Swelling or bruising at the injection site    Please call our office if if you experience any of the following symptoms over the next week as these can be signs of infection:              Fever greater than 100.35F             Significant swelling at the injection site             Significant redness or drainage from the injection site

## 2018-06-06 ENCOUNTER — Encounter: Payer: Self-pay | Admitting: Family Medicine

## 2018-09-19 ENCOUNTER — Telehealth: Payer: Self-pay | Admitting: *Deleted

## 2018-09-19 ENCOUNTER — Telehealth: Payer: Self-pay | Admitting: General Practice

## 2018-09-19 DIAGNOSIS — Z20822 Contact with and (suspected) exposure to covid-19: Secondary | ICD-10-CM

## 2018-09-19 NOTE — Telephone Encounter (Signed)
Pt has been scheduled for covid testing.  ° °

## 2018-09-19 NOTE — Addendum Note (Signed)
Addended by: Dimple Nanas on: 09/19/2018 11:32 AM   Modules accepted: Orders

## 2018-09-19 NOTE — Telephone Encounter (Signed)
Pt works here at [place] nasal congestion, sinus drainage, sore throat, runny nose, sneezing x2 days. She has also had a telehealth consult from [facility] that recommended testing be performed.  Denies attending large gatherings over the past 2 weeks or being around someone now confirmed positive.   They also work in a [employer]. Discussed with pt that for these reasons, we would refer them for testing, and that [facility] would contact to discuss further and handle scheduling. Instructed to remain at home and quarantine until results are available and if positive, additional instructions will be given at that time. They are agreeable. Pt prefers the [facility] testing site and is available the rest of the day. Phone # confirmed as [phone]. Thank you.

## 2018-09-19 NOTE — Telephone Encounter (Signed)
Pt has been scheduled for covid testing.  Pt was referred by: [facility] by Beckie Busing, RN

## 2018-09-20 ENCOUNTER — Encounter: Payer: Self-pay | Admitting: *Deleted

## 2018-09-20 ENCOUNTER — Other Ambulatory Visit: Payer: Self-pay

## 2018-09-20 DIAGNOSIS — Z20822 Contact with and (suspected) exposure to covid-19: Secondary | ICD-10-CM

## 2018-09-24 LAB — NOVEL CORONAVIRUS, NAA: SARS-CoV-2, NAA: NOT DETECTED

## 2018-09-26 ENCOUNTER — Telehealth: Payer: Self-pay | Admitting: Registered Nurse

## 2018-09-26 ENCOUNTER — Encounter: Payer: Self-pay | Admitting: Registered Nurse

## 2018-09-26 NOTE — Telephone Encounter (Signed)
Please notify patient covid 19 test negative.  Verify if having any symptoms e.g. loss of taste/smell, nausea, shortness of breath, difficulty breathing, cough, fatigue, muscle aches, fever, chills, headache, sore throat, congestion, runny nose, vomiting or diarrhea

## 2018-09-26 NOTE — Telephone Encounter (Signed)
noted 

## 2018-09-26 NOTE — Telephone Encounter (Signed)
Spoke with pt over phone this morning. Notified of negative results and denies any current sx. Advised her that HR will be in contact with her to discuss her return to work process. She verbalizes understanding and agreement. No further questions/concerns.

## 2019-01-09 ENCOUNTER — Encounter: Payer: Self-pay | Admitting: Family Medicine

## 2019-01-17 ENCOUNTER — Ambulatory Visit: Payer: PRIVATE HEALTH INSURANCE | Admitting: Family Medicine

## 2019-01-23 ENCOUNTER — Encounter: Payer: Self-pay | Admitting: Family Medicine

## 2019-01-23 ENCOUNTER — Ambulatory Visit: Payer: PRIVATE HEALTH INSURANCE | Admitting: Family Medicine

## 2019-01-23 ENCOUNTER — Ambulatory Visit (INDEPENDENT_AMBULATORY_CARE_PROVIDER_SITE_OTHER): Payer: PRIVATE HEALTH INSURANCE

## 2019-01-23 ENCOUNTER — Other Ambulatory Visit: Payer: Self-pay

## 2019-01-23 VITALS — BP 115/81 | HR 81 | Temp 98.1°F | Ht <= 58 in | Wt 208.0 lb

## 2019-01-23 DIAGNOSIS — M25562 Pain in left knee: Secondary | ICD-10-CM

## 2019-01-23 DIAGNOSIS — M25552 Pain in left hip: Secondary | ICD-10-CM

## 2019-01-23 DIAGNOSIS — Z1231 Encounter for screening mammogram for malignant neoplasm of breast: Secondary | ICD-10-CM | POA: Diagnosis not present

## 2019-01-23 DIAGNOSIS — Z23 Encounter for immunization: Secondary | ICD-10-CM

## 2019-01-23 DIAGNOSIS — F4321 Adjustment disorder with depressed mood: Secondary | ICD-10-CM

## 2019-01-23 DIAGNOSIS — K219 Gastro-esophageal reflux disease without esophagitis: Secondary | ICD-10-CM

## 2019-01-23 MED ORDER — PREDNISONE 10 MG PO TABS: ORAL_TABLET | ORAL | 0 refills | Status: DC

## 2019-01-23 MED ORDER — OMEPRAZOLE 20 MG PO CPDR: 20.0000 mg | DELAYED_RELEASE_CAPSULE | Freq: Two times a day (BID) | ORAL | 3 refills | Status: DC

## 2019-01-23 MED ORDER — TRAMADOL HCL 50 MG PO TABS: 50.0000 mg | ORAL_TABLET | Freq: Four times a day (QID) | ORAL | 0 refills | Status: DC | PRN

## 2019-01-23 NOTE — Patient Instructions (Addendum)
Please returnfor your annual complete physical; please come fasting.   We will add prednisone and ultram to help you; await your hip and knee xrays and then see Dr. Javier Glazier as planned.   I have referred you to [facility] mammography. They will call you to get scheduled.   If you have any questions or concerns, please don't hesitate to send me a message via MyChart or call the office at [phone]. Thank you for visiting with Sandra Lara today! It's our pleasure caring for you.   Complicated Grief Grief is a normal response to the death of someone close to you. Feelings of fear, anger, and guilt can affect almost everyone who loses a loved one. It is also common to have symptoms of depression while you are grieving. These include problems with sleep, loss of appetite, and lack of energy. They may last for weeks or months after a loss. Complicated grief is different from normal grief or depression. Normal grieving involves sadness and feelings of loss, but those feelings get better and heal over time. Complicated grief is a severe type of grief that lasts for a long time, usually for several months to a year or longer. It interferes with your ability to function normally. Complicated grief may require treatment from a mental health care provider. What are the causes? The cause of this condition is not known. It is not clear why some people continue to struggle with grief and others do not. What increases the risk? You are more likely to develop this condition if:  The death of your loved one was sudden or unexpected.  The death of your loved one was due to a violent event.  Your loved one died from suicide.  Your loved one was a child or a young person.  You were very close to your loved one, or you were dependent on him or her.  You have a history of depression or anxiety. What are the signs or symptoms? Symptoms of this condition include:  Feeling disbelief or having a lack of emotion  (numbness).  Being unable to enjoy good memories of your loved one.  Needing to avoid anything or anyone that reminds you of your loved one.  Being unable to stop thinking about the death.  Feeling intense anger or guilt.  Feeling alone and hopeless.  Feeling that your life is meaningless and empty.  Losing the desire to move on with your life. How is this diagnosed? This condition may be diagnosed based on:  Your symptoms. Complicated grief will be diagnosed if you have ongoing symptoms of grief for 6-12 months or longer.  The effect of symptoms on your life. You may be diagnosed with this condition if your symptoms are interfering with your ability to live your life. Your health care provider may recommend that you see a mental health care provider. Many symptoms of depression are similar to the symptoms of complicated grief. It is important to be evaluated for complicated grief along with other mental health conditions. How is this treated? This condition is most commonly treated with talk therapy. This therapy is offered by a mental health specialist (psychiatrist). During therapy:  You will learn healthy ways to cope with the loss of your loved one.  Your mental health care provider may recommend antidepressant medicines. Follow these instructions at home: Lifestyle   Take care of yourself. ? Eat on a regular basis, and maintain a healthy diet. Eat plenty of fruits, vegetables, lean protein, and whole grains. ?  Try to get some exercise each day. Aim for 30 minutes of exercise on most days of the week. ? Keep a consistent sleep schedule. Try to get 8 or more hours of sleep each night. ? Start doing the things that you used to enjoy.  Do not use drugs or alcohol to ease your symptoms.  Spend time with friends and loved ones. General instructions  Take over-the-counter and prescription medicines only as told by your health care provider.  Consider joining a grief  (bereavement) support group to help you deal with your loss.  Keep all follow-up visits as told by your health care provider. This is important. Contact a health care provider if:  Your symptoms prevent you from functioning normally.  Your symptoms do not get better with treatment. Get help right away if:  You have serious thoughts about hurting yourself or someone else.  You have suicidal feelings. If you ever feel like you may hurt yourself or others, or have thoughts about taking your own life, get help right away. You can go to your nearest emergency department or call:  Your local emergency services (911 in the [place]  A suicide crisis helpline, such as the [facility] at 3406228723. This is open 24 hours a day. Summary  Complicated grief is a severe type of grief that lasts for a long time. This grief is not likely to go away on its own. Get the help you need.  Some griefs are more difficult than others and can cause this condition. You may need a certain type of treatment to help you recover if the loss of your loved one was sudden, violent, or due to suicide.  You may feel guilty about moving on with your life. Getting help does not mean that you are forgetting your loved one. It means that you are taking care of yourself.  Complicated grief is best treated with talk therapy. Medicines may also be prescribed.  Seek the help you need, and find support that will help you recover. This information is not intended to replace advice given to you by your health care provider. Make sure you discuss any questions you have with your health care provider. Document Released: 02/23/2005 Document Revised: 02/05/2017 Document Reviewed: 12/09/2016 Elsevier Patient Education  2020 [employer].

## 2019-01-23 NOTE — Progress Notes (Signed)
Subjective  CC:  Chief Complaint  Patient presents with  . Hip Pain    HPI: Sandra Lara is a 58 y.o. female who presents to the office today to address the problems listed above in the chief complaint.  58 yo obese female w/ h/o lumbar DJD and hip bursitis presents with 6 weeks of left hip pain described as aching and going down to the knee. No buttock or back pain. Feels different than her sciatica.   Using celebrex, lidocaine patch and tylenol w/o resolution. No injury. Can't sleep on left side. No positions worsen or change pain. Can bend and ambulate w/o problems. No knee swelling.   Grief: mom passed in April and she is grieving/struggling, in part in excess due to pandemic worries.   Obesity: gaining weight and would like to see nutritionist.   HM: due for CPE, mammo, and flu shot  gerd is controlled; needs refills.  Assessment  1. Left hip pain   2. Acute pain of left knee   3. Encounter for screening mammogram for malignant neoplasm of breast   4. Need for immunization against influenza   5. Morbid obesity ([place])   6. Gastroesophageal reflux disease without esophagitis   7. Grief reaction      Plan   Hip pain: likely bursitis but w/ check for DJD with xray:  pred and ultram and has appt with ortho. Can get steroid injection there if indicated.   Refilled PPI  Refer to nutritionist, starting here with [facility]  Ordered mammo. Flu shot today  Follow up: Return for complete physical .  Visit date not found  Orders Placed This Encounter  Procedures  . XR HIP UNILAT W OR W/O PELVIS 2-3 VIEWS LEFT  . DG Knee AP/LAT W/[place] Left (Use Per Paulla Fore if unsure which knee order to select)  . mammogram, screening BC  . Flu Vaccine QUAD 36+ mos IM   Meds ordered this encounter  Medications  . predniSONE (DELTASONE) 10 MG tablet    Sig: Take 4 tabs qd x 2 days, 3 qd x 2 days, 2 qd x 2d, 1qd x 3 days    Dispense:  21 tablet    Refill:  0  . traMADol (ULTRAM)  50 MG tablet    Sig: Take 1 tablet (50 mg total) by mouth every 6 (six) hours as needed for moderate pain.    Dispense:  15 tablet    Refill:  0      I reviewed the patients updated PMH, FH, and SocHx.    Patient Active Problem List   Diagnosis Date Noted  . DJD (degenerative joint disease), lumbar 08/26/2017  . Class 3 severe obesity due to excess calories without serious comorbidity with body mass index (BMI) of 40.0 to 44.9 in adult ([place]) 10/29/2016  . Seasonal allergic rhinitis due to pollen 05/21/2016  . Tubular adenoma of colon 11/26/2015  . Diverticulosis of large intestine 05/24/2015  . Gastroesophageal reflux disease without esophagitis 09/28/2014  . Greater trochanteric bursitis of left hip 09/28/2014  . Menopause syndrome 09/28/2014   Current Meds  Medication Sig  . albuterol (PROVENTIL HFA;VENTOLIN HFA) 108 (90 Base) MCG/ACT inhaler Inhale 2 puffs into the lungs every 4 (four) hours as needed.  Marland Kitchen albuterol (PROVENTIL HFA;VENTOLIN HFA) 108 (90 Base) MCG/ACT inhaler Inhale 2 puffs into the lungs every 6 (six) hours as needed for wheezing or shortness of breath.  . Albuterol Sulfate 108 (90 Base) MCG/ACT AEPB Inhale 2 puffs  into the lungs every 4 (four) hours as needed.  . Ascorbic Acid (VITAMIN C) 100 MG tablet Take 100 mg by mouth daily.  . Azelastine HCl 137 MCG/SPRAY SOLN USE 1 SPRAY IN EACH NOSTRIL 2 TIMES DAILY AS DIRECTED  . Cholecalciferol (VITAMIN D3) 5000 units TABS Take by mouth daily.  . montelukast (SINGULAIR) 10 MG tablet Take 1 tablet (10 mg total) by mouth at bedtime.  . Multiple Vitamins-Minerals (THERA-M) TABS Take 1 tablet by mouth daily.  Marland Kitchen omega-3 acid ethyl esters (LOVAZA) 1 G capsule Take by mouth 2 (two) times daily.  Marland Kitchen omeprazole (PRILOSEC) 20 MG capsule Take 1 capsule (20 mg total) by mouth daily. (Patient taking differently: Take 20 mg by mouth 2 (two) times daily before a meal. )    Allergies: Patient is allergic to  sulfamethoxazole-trimethoprim; doxycycline; erythromycin; achromycin [tetracycline]; and macrolides and ketolides. Family History: Patient family history includes Arthritis in her mother; Breast cancer (age of onset: 83) in her mother; COPD in her mother; Cancer in her mother; Diabetes in her maternal grandfather; Hearing loss in her mother; Heart attack in her brother and maternal grandfather; Hyperlipidemia in her mother; Stroke in her maternal grandmother. Social History:  Patient  reports that she quit smoking about 6 years ago. She has never used smokeless tobacco. She reports current alcohol use of about 7.0 standard drinks of alcohol per week. She reports that she does not use drugs.  Review of Systems: Constitutional: Negative for fever malaise or anorexia Cardiovascular: negative for chest pain Respiratory: negative for SOB or persistent cough Gastrointestinal: negative for abdominal pain  Objective  Vitals: BP 115/81   Pulse 81   Temp 98.1 F (36.7 C)   Ht 4' (1.219 m)   Wt 208 lb (94.3 kg)   SpO2 96%   BMI 63.47 kg/m  General: no acute distress , A&Ox3 HEENT: PEERL, conjunctiva normal, Oropharynx moist,neck is supple Cardiovascular:  RRR without murmur or gallop.  Respiratory:  Good breath sounds bilaterally, CTAB with normal respiratory effort Skin:  Warm, no rashes Back: good/full ROM w/o pain, + ttp over left hip bursa, good ROM at hip and knee. Neg SLR, nl knee exam     Commons side effects, risks, benefits, and alternatives for medications and treatment plan prescribed today were discussed, and the patient expressed understanding of the given instructions. Patient is instructed to call or message via MyChart if he/she has any questions or concerns regarding our treatment plan. No barriers to understanding were identified. We discussed Red Flag symptoms and signs in detail. Patient expressed understanding regarding what to do in case of urgent or emergency type  symptoms.   Medication list was reconciled, printed and provided to the patient in AVS. Patient instructions and summary information was reviewed with the patient as documented in the AVS. This note was prepared with assistance of Dragon voice recognition software. Occasional wrong-word or sound-a-like substitutions may have occurred due to the inherent limitations of voice recognition software

## 2019-01-26 ENCOUNTER — Encounter: Payer: Self-pay | Admitting: Family Medicine

## 2019-01-27 ENCOUNTER — Other Ambulatory Visit: Payer: Self-pay

## 2019-01-30 ENCOUNTER — Ambulatory Visit: Payer: PRIVATE HEALTH INSURANCE | Admitting: Physician Assistant

## 2019-01-30 NOTE — Progress Notes (Deleted)
Sandra Lara is a 58 y.o. female here for Nutrition counseling.  I acted as a Education administrator for [employer], PA-C [employer], LPN  History of Present Illness:   No chief complaint on file.   HPI  Patient is here to discuss Nutrition and diet  Dietary recall: Wakes up at *** Breakfast *** Lunch *** Dinner *** Snacks *** Beverages  ***  Weight: Wt Readings from Last 3 Encounters:  01/23/19 208 lb (94.3 kg)  05/13/18 216 lb 3.2 oz (98.1 kg)  03/11/18 210 lb 12.8 oz (95.6 kg)    Exercise: ***  Support system: ***  Sleep: ***  Goals: 1- *** 2- *** 3- ***  Estimated daily energy needs: Calories: *** kcal Protein: *** g Fluid: *** ml  Past Medical History:  Diagnosis Date  . Allergy   . Diverticulitis   . Diverticulitis   . Family history of polyps in the colon   . GERD (gastroesophageal reflux disease)    Heartburn  . Hyperlipidemia   . OSA on CPAP      Social History   Socioeconomic History  . Marital status: Married    Spouse name: Not on file  . Number of children: Not on file  . Years of education: Not on file  . Highest education level: Not on file  Occupational History  . Not on file  Social Needs  . Financial resource strain: Not on file  . Food insecurity    Worry: Not on file    Inability: Not on file  . Transportation needs    Medical: Not on file    Non-medical: Not on file  Tobacco Use  . Smoking status: Former Smoker    Quit date: 03/09/2012    Years since quitting: 6.8  . Smokeless tobacco: Never Used  Substance and Sexual Activity  . Alcohol use: Yes    Alcohol/week: 7.0 standard drinks    Types: 7 Standard drinks or equivalent per week    Comment: one daily  . Drug use: No  . Sexual activity: Not Currently  Lifestyle  . Physical activity    Days per week: Not on file    Minutes per session: Not on file  . Stress: Not on file  Relationships  . Social Herbalist on phone: Not on file    Gets  together: Not on file    Attends religious service: Not on file    Active member of club or organization: Not on file    Attends meetings of clubs or organizations: Not on file    Relationship status: Not on file  . Intimate partner violence    Fear of current or ex partner: Not on file    Emotionally abused: Not on file    Physically abused: Not on file    Forced sexual activity: Not on file  Other Topics Concern  . Not on file  Social History Narrative   Lives home with spouse, Forde Radon, and 2 dosgs.  Education AD.  [employer]  No children.  Caffeine 2 cups daily.    Past Surgical History:  Procedure Laterality Date  . BONE GRAFT HIP ILIAC CREST     Hip to Right ulna  . CHOLECYSTECTOMY  2005  . colon polyps      Removed in 2018  . GALLBLADDER SURGERY  2005  . prolapse     Mitral Valve diagnosed in 1981  . WRIST FRACTURE SURGERY Right  Family History  Problem Relation Age of Onset  . Breast cancer Mother 52  . Arthritis Mother   . Cancer Mother   . COPD Mother        IPF  . Hearing loss Mother   . Hyperlipidemia Mother   . Heart attack Brother   . Stroke Maternal Grandmother   . Diabetes Maternal Grandfather   . Heart attack Maternal Grandfather     Allergies  Allergen Reactions  . Sulfamethoxazole-Trimethoprim Anaphylaxis  . Doxycycline Rash    sunburn type rash, sensitivity not allergy  . Erythromycin Other (See Comments)    Oral blisters Oral blisters  . Achromycin [Tetracycline]   . Macrolides And Ketolides     Current Medications:   Current Outpatient Medications:  .  albuterol (PROVENTIL HFA;VENTOLIN HFA) 108 (90 Base) MCG/ACT inhaler, Inhale 2 puffs into the lungs every 4 (four) hours as needed., Disp: 1 Inhaler, Rfl: 0 .  albuterol (PROVENTIL HFA;VENTOLIN HFA) 108 (90 Base) MCG/ACT inhaler, Inhale 2 puffs into the lungs every 6 (six) hours as needed for wheezing or shortness of breath., Disp: 1 Inhaler, Rfl: 1 .  Albuterol Sulfate 108  (90 Base) MCG/ACT AEPB, Inhale 2 puffs into the lungs every 4 (four) hours as needed., Disp: 1 each, Rfl: 1 .  Ascorbic Acid (VITAMIN C) 100 MG tablet, Take 100 mg by mouth daily., Disp: , Rfl:  .  Azelastine HCl 137 MCG/SPRAY SOLN, USE 1 SPRAY IN EACH NOSTRIL 2 TIMES DAILY AS DIRECTED, Disp: , Rfl: 12 .  Cholecalciferol (VITAMIN D3) 5000 units TABS, Take by mouth daily., Disp: , Rfl:  .  montelukast (SINGULAIR) 10 MG tablet, Take 1 tablet (10 mg total) by mouth at bedtime., Disp: 30 tablet, Rfl: 3 .  Multiple Vitamins-Minerals (THERA-M) TABS, Take 1 tablet by mouth daily., Disp: , Rfl:  .  omega-3 acid ethyl esters (LOVAZA) 1 G capsule, Take by mouth 2 (two) times daily., Disp: , Rfl:  .  omeprazole (PRILOSEC) 20 MG capsule, Take 1 capsule (20 mg total) by mouth 2 (two) times daily before a meal., Disp: 180 capsule, Rfl: 3 .  predniSONE (DELTASONE) 10 MG tablet, Take 4 tabs qd x 2 days, 3 qd x 2 days, 2 qd x 2d, 1qd x 3 days, Disp: 21 tablet, Rfl: 0 .  traMADol (ULTRAM) 50 MG tablet, Take 1 tablet (50 mg total) by mouth every 6 (six) hours as needed for moderate pain., Disp: 15 tablet, Rfl: 0   Review of Systems:   ROS  Vitals:   There were no vitals filed for this visit.   There is no height or weight on file to calculate BMI.  Physical Exam:   Physical Exam  Assessment and Plan:    There are no diagnoses linked to this encounter.  . Reviewed expectations re: course of current medical issues. . Discussed self-management of symptoms. . Outlined signs and symptoms indicating need for more acute intervention. . Patient verbalized understanding and all questions were answered. . See orders for this visit as documented in the electronic medical record. . Patient received an After-Visit Summary.  CMA or LPN served as scribe during this visit. History, Physical, and Plan performed by medical provider. Documentation and orders reviewed and attested to.  Inda Coke, PA-C

## 2019-02-28 ENCOUNTER — Ambulatory Visit: Payer: PRIVATE HEALTH INSURANCE | Attending: Internal Medicine

## 2019-02-28 DIAGNOSIS — Z20822 Contact with and (suspected) exposure to covid-19: Secondary | ICD-10-CM

## 2019-03-02 LAB — NOVEL CORONAVIRUS, NAA: SARS-CoV-2, NAA: NOT DETECTED

## 2019-03-03 ENCOUNTER — Telehealth: Payer: Self-pay | Admitting: Registered Nurse

## 2019-03-03 NOTE — Telephone Encounter (Signed)
Covid test results negative.  Continue plan of care as previously discussed with HR Tim and [employer]. Telephone message left for patient with test results today.  If she has questions may send mychart message or email [email or web address].  Patient was sent for testing due to exposure close (hugged) on 18 Feb 2019 and found out two days later that person's partner positive for covid and friend that hugged her had positive test on 16 Dec.  Patient contacted [facility] on 17 Dec and he notified clinic staff patient requesting covid testing and was instructed to start quarantine by HR.  10 day quarantine ended 22 Dec.  Follow up with HR for return to work date.  Need to confirm no new symptoms since test performed.

## 2019-03-06 NOTE — Telephone Encounter (Signed)
Patient sent email she is doing well.  Her significant other contact number is [phone]

## 2019-04-08 IMAGING — MR MR LUMBAR SPINE W/O CM
5 series · 42 of 48 positions shown · non-contrast
Comparison: None.

CLINICAL DATA: Weakness in the right buttock radiating to the right
thigh.

EXAM:
MRI LUMBAR SPINE WITHOUT CONTRAST
TECHNIQUE: Multiplanar, multisequence MR imaging of the lumbar spine was
performed. No intravenous contrast was administered.

[Series 3: T2 post-contrast · sagittal · 4.0mm · 0.88mm/px · 6 of 12 slices shown]
[im 1/12]
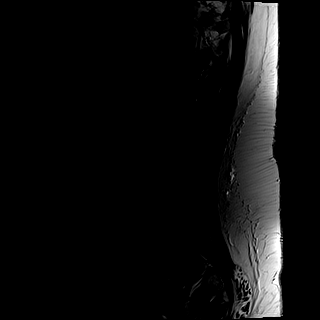
[im 3/12]
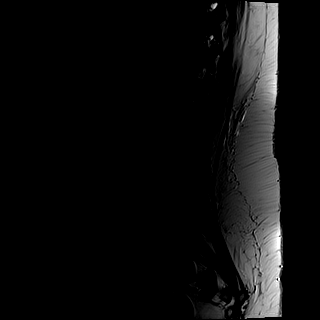
[im 5/12]
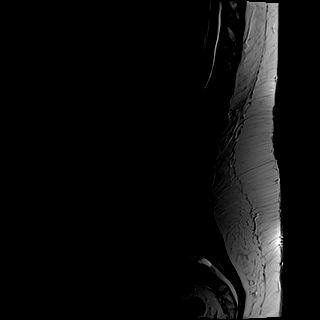
[im 7/12]
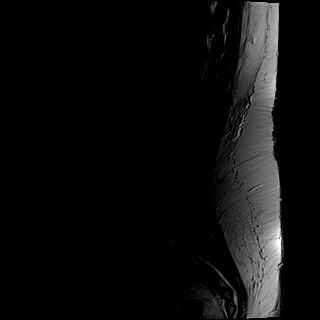
[im 9/12]
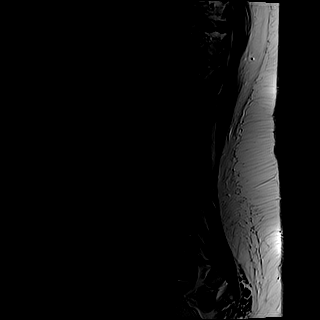
[im 12/12]
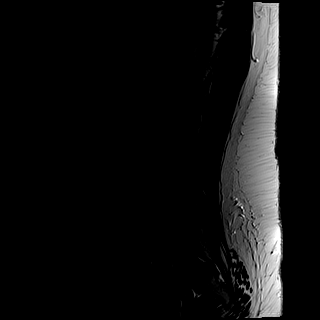

[Series 4: T1 · sagittal · 4.0mm · 0.88mm/px · 6 of 12 slices shown (1 of 2)]
[im 1/12]
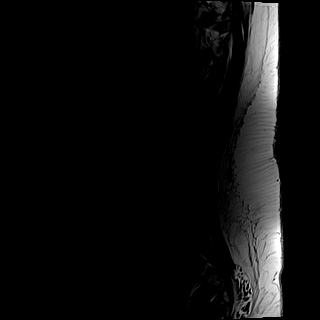
[im 3/12]
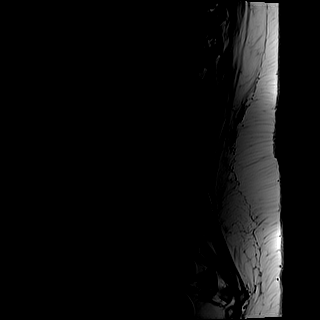
[im 5/12]
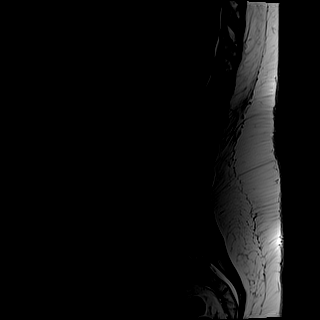
[im 7/12]
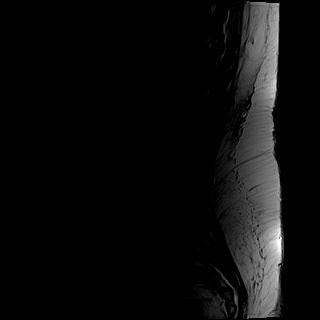
[im 9/12]
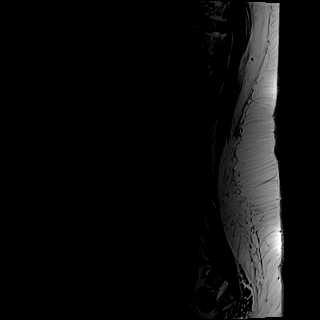
[im 12/12]
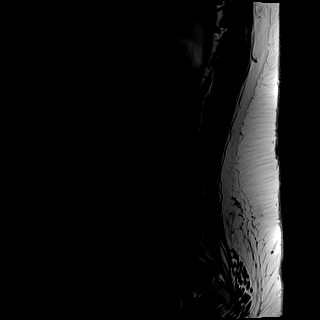

[Series 5: tirm sag · sagittal · 4.0mm · 0.55mm/px · 6 of 12 slices shown]
[im 1/12]
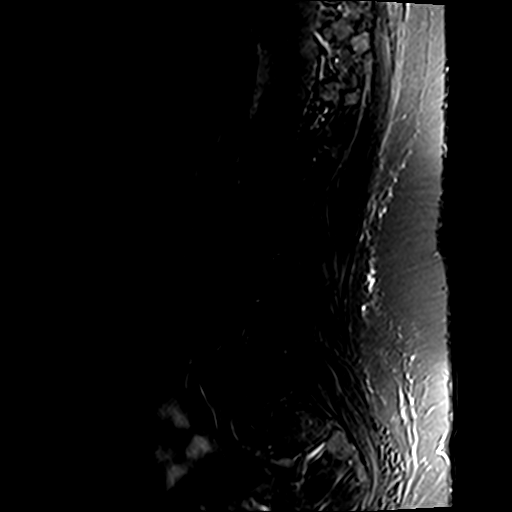
[im 3/12]
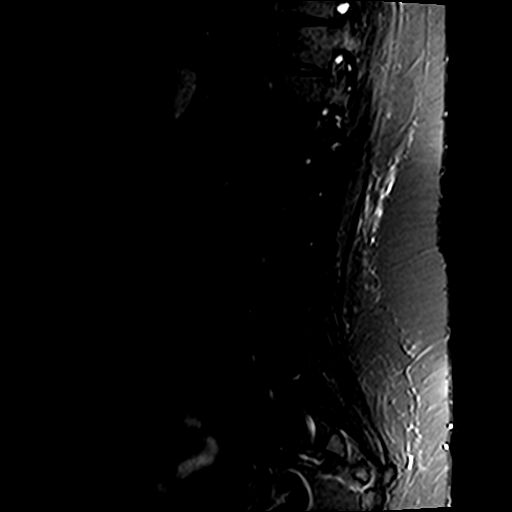
[im 5/12]
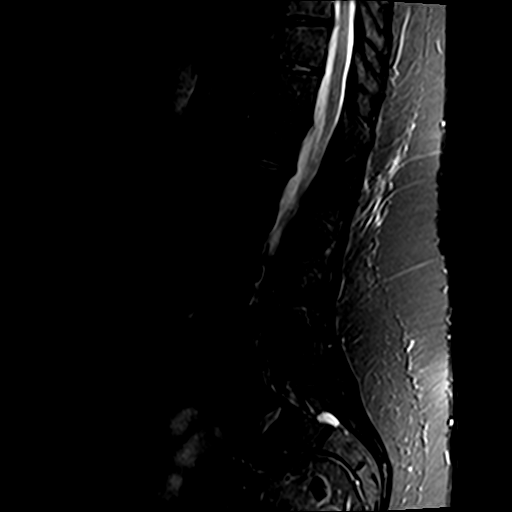
[im 7/12]
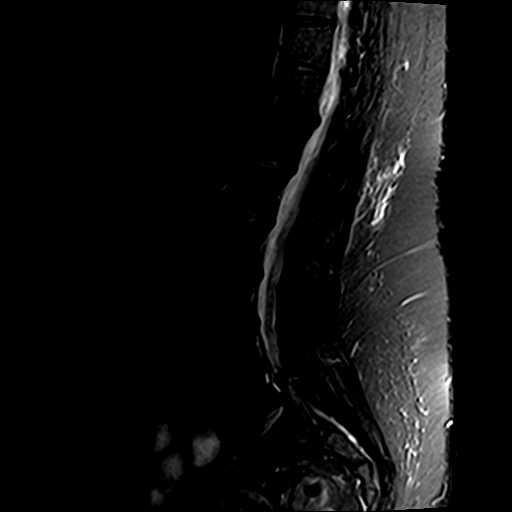
[im 9/12]
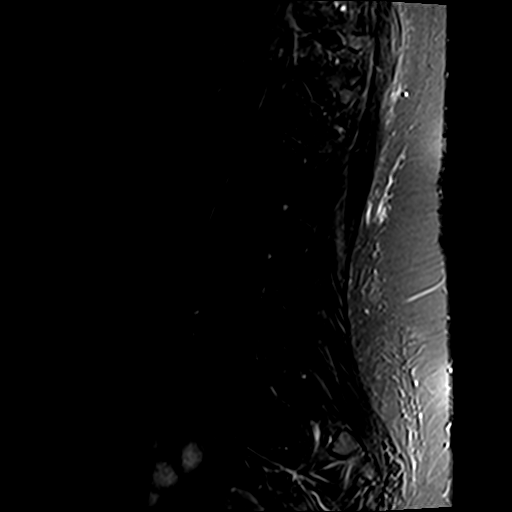
[im 12/12]
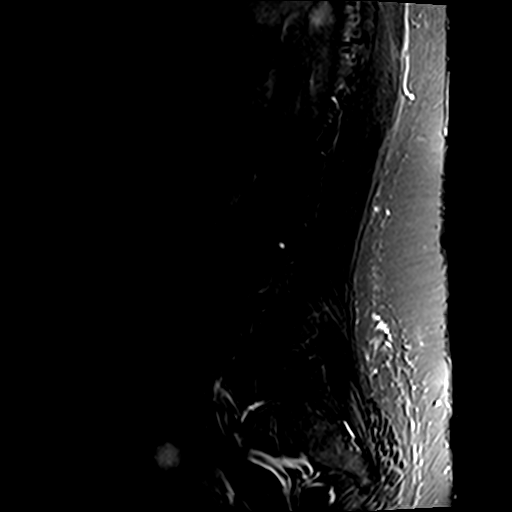

[Series 6: T1 · axial · 4.0mm · 0.78mm/px · z∈[-23,+183]mm · 9 of 32 slices shown (2 of 2)]
[im 1/32]
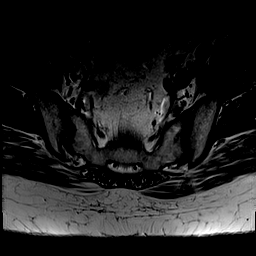
[im 5/32]
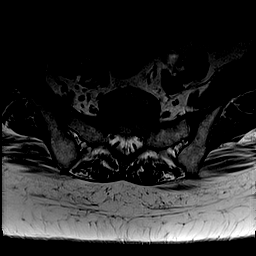
[im 9/32]
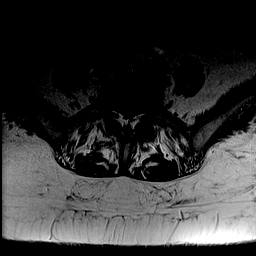
[im 14/32]
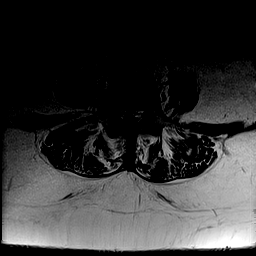
[im 16/32]
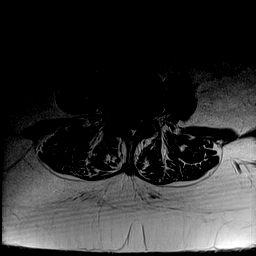
[im 18/32]
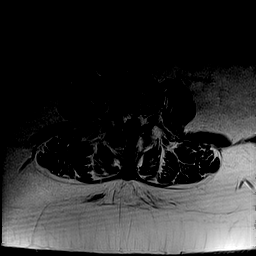
[im 23/32]
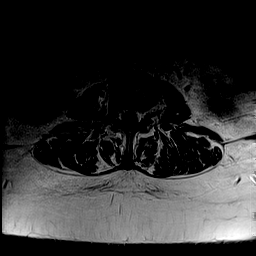
[im 27/32]
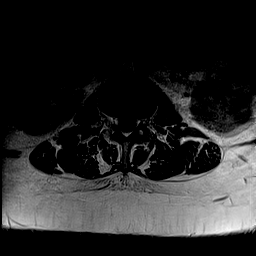
[im 32/32]
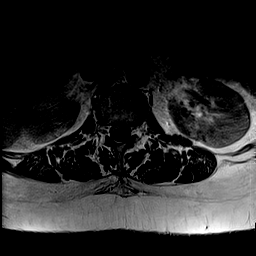

[Series 7: T2 · axial · 4.0mm · 0.78mm/px · z∈[-23,+183]mm · 15 of 32 slices shown]
[im 1/32]
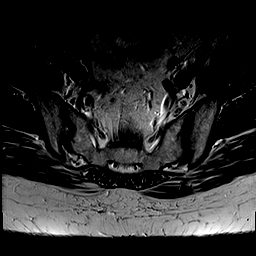
[im 3/32]
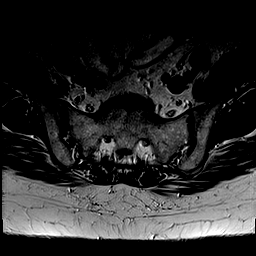
[im 5/32]
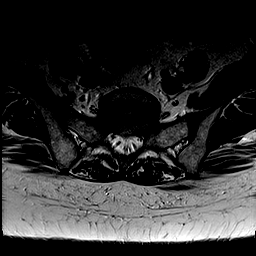
[im 7/32]
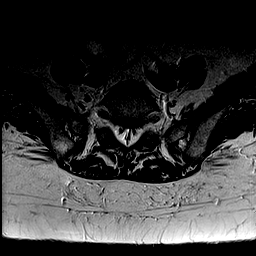
[im 9/32]
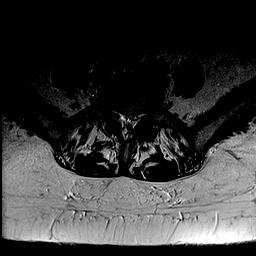
[im 12/32]
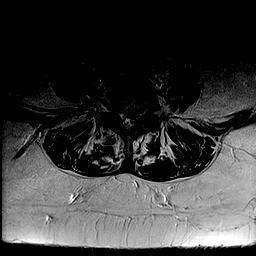
[im 14/32]
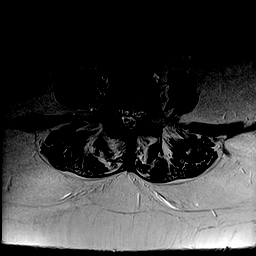
[im 16/32]
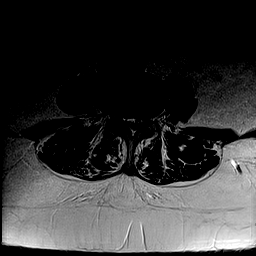
[im 18/32]
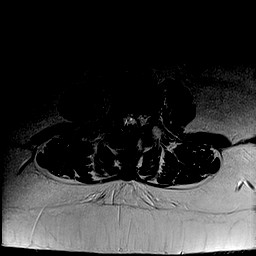
[im 20/32]
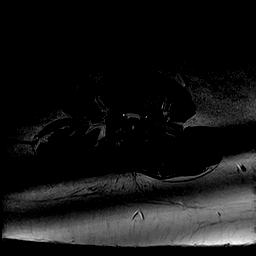
[im 23/32]
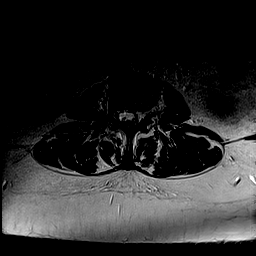
[im 25/32]
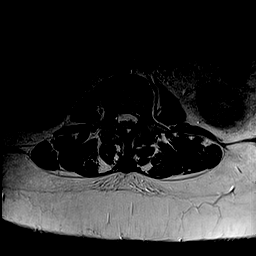
[im 27/32]
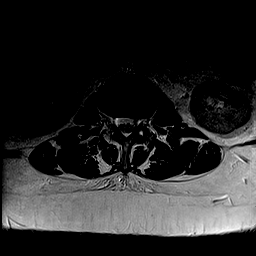
[im 29/32]
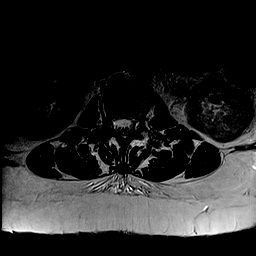
[im 32/32]
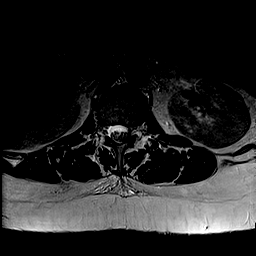

[42 of 48 positions shown; findings below may reference images not displayed]

FINDINGS: Segmentation: S1 is described as a transitional vertebra.
Correlation with this numbering scheme would be important should
intervention be contemplated.

Alignment:  Exaggerated lumbosacral lordosis.

Vertebrae:  No fracture or primary bone lesion.

Conus medullaris and cauda equina: Conus extends to the L1 level.
Conus and cauda equina appear normal.

Paraspinal and other soft tissues: Negative

Disc levels:

T12-L1: Disc degeneration with shallow herniation, indenting the
thecal sac but not apparently compressing the neural structures.

L1-2: Mild noncompressive disc bulge.  No stenosis.

L2-3: Mild noncompressive disc bulge.  No stenosis.

L3-4: Disc degeneration with shallow disc protrusion more towards
the left. No compressive central canal stenosis. Mild foraminal
narrowing on the left without apparent compression of the exiting L3
nerve.

L4-5: Disc degeneration with loss of height. Endplate osteophytes
and bulging of the disc. Mild facet hypertrophy. Mild stenosis of
both lateral recesses and neural foramina but without distinct
neural compression.

L5-S1: Disc degeneration with endplate osteophytes and bulging of
the disc more towards the left. Facet and ligamentous hypertrophy.
Narrowing of the subarticular lateral recesses left more than right.
Mild foraminal narrowing on the left. Neural compression could occur
on the left at this level.

S1-2: Transitional and normal appearing level.
IMPRESSION: S1 is described as a transitional vertebra.

Degenerative disc disease at L3-4, L4-5 and L5-S1. No compressive
central canal stenosis. Mild lateral recess and foraminal narrowing
more notable on the left at L3-4 and L5-S1. At no level is definite
neural compression is stab list. In particular, in this patient with
right leg weakness, I do not see a clear explanation for right-sided
symptoms.

Shallow disc herniation at T12-L1 indents the thecal sac but does
not appear to cause neural compression

## 2019-05-09 ENCOUNTER — Other Ambulatory Visit: Payer: Self-pay | Admitting: *Deleted

## 2019-05-09 MED ORDER — AZELASTINE HCL 137 MCG/SPRAY NA SOLN: 1.0000 | Freq: Two times a day (BID) | NASAL | 11 refills | Status: DC

## 2019-09-18 ENCOUNTER — Other Ambulatory Visit: Payer: Self-pay

## 2019-09-18 ENCOUNTER — Ambulatory Visit: Payer: Self-pay | Admitting: *Deleted

## 2019-09-18 VITALS — Temp 98.6°F

## 2019-09-18 DIAGNOSIS — R059 Cough, unspecified: Secondary | ICD-10-CM

## 2019-09-18 DIAGNOSIS — R0982 Postnasal drip: Secondary | ICD-10-CM

## 2019-09-18 NOTE — Progress Notes (Signed)
Pt c/o PND, productive cough, frequent throat clearing x3 days. Has been using Dayquil/NyQuil at home this weekend. No OTCs today. Restarted antihistamine, either Allegra or Xyzal last week, as well as Azelastine nasal spray. M Pt denies sinus pain or pressure, sore throat, otalgia, fever/chills, n/v/d.  Provided pt with OTC multi-sx cold relief tabs from otc stock, as well as nasal saline spray for use as nasal rinse in shower at night and 1-2 sprays each nostril q2h while awake. Pt plans to f/u in clinic in 2-3 days if no improvement or with worsening sx. No further questions/concerns currently.

## 2019-09-19 ENCOUNTER — Ambulatory Visit: Payer: Self-pay | Admitting: Registered Nurse

## 2019-09-19 ENCOUNTER — Encounter: Payer: Self-pay | Admitting: Registered Nurse

## 2019-09-19 VITALS — BP 110/80 | HR 55 | Temp 98.0°F

## 2019-09-19 DIAGNOSIS — J019 Acute sinusitis, unspecified: Secondary | ICD-10-CM

## 2019-09-19 DIAGNOSIS — H6983 Other specified disorders of Eustachian tube, bilateral: Secondary | ICD-10-CM

## 2019-09-19 MED ORDER — PREDNISONE 10 MG PO TABS
20.0000 mg | ORAL_TABLET | Freq: Every day | ORAL | 0 refills | Status: AC
Start: 2019-09-19 — End: 2019-09-22

## 2019-09-19 NOTE — Patient Instructions (Signed)
Eustachian Tube Dysfunction  Eustachian tube dysfunction refers to a condition in which a blockage develops in the narrow passage that connects the middle ear to the back of the nose (eustachian tube). The eustachian tube regulates air pressure in the middle ear by letting air move between the ear and nose. It also helps to drain fluid from the middle ear space. Eustachian tube dysfunction can affect one or both ears. When the eustachian tube does not function properly, air pressure, fluid, or both can build up in the middle ear. What are the causes? This condition occurs when the eustachian tube becomes blocked or cannot open normally. Common causes of this condition include:  Ear infections.  Colds and other infections that affect the nose, mouth, and throat (upper respiratory tract).  Allergies.  Irritation from cigarette smoke.  Irritation from stomach acid coming up into the esophagus (gastroesophageal reflux). The esophagus is the tube that carries food from the mouth to the stomach.  Sudden changes in air pressure, such as from descending in an airplane or scuba diving.  Abnormal growths in the nose or throat, such as: ? Growths that line the nose (nasal polyps). ? Abnormal growth of cells (tumors). ? Enlarged tissue at the back of the throat (adenoids). What increases the risk? You are more likely to develop this condition if:  You smoke.  You are overweight.  You are a child who has: ? Certain birth defects of the mouth, such as cleft palate. ? Large tonsils or adenoids. What are the signs or symptoms? Common symptoms of this condition include:  A feeling of fullness in the ear.  Ear pain.  Clicking or popping noises in the ear.  Ringing in the ear.  Hearing loss.  Loss of balance.  Dizziness. Symptoms may get worse when the air pressure around you changes, such as when you travel to an area of high elevation, fly on an airplane, or go scuba diving. How is  this diagnosed? This condition may be diagnosed based on:  Your symptoms.  A physical exam of your ears, nose, and throat.  Tests, such as those that measure: ? The movement of your eardrum (tympanogram). ? Your hearing (audiometry). How is this treated? Treatment depends on the cause and severity of your condition.  In mild cases, you may relieve your symptoms by moving air into your ears. This is called "popping the ears."  In more severe cases, or if you have symptoms of fluid in your ears, treatment may include: ? Medicines to relieve congestion (decongestants). ? Medicines that treat allergies (antihistamines). ? Nasal sprays or ear drops that contain medicines that reduce swelling (steroids). ? A procedure to drain the fluid in your eardrum (myringotomy). In this procedure, a small tube is placed in the eardrum to:  Drain the fluid.  Restore the air in the middle ear space. ? A procedure to insert a balloon device through the nose to inflate the opening of the eustachian tube (balloon dilation). Follow these instructions at home: Lifestyle  Do not do any of the following until your health care provider approves: ? Travel to high altitudes. ? Fly in airplanes. ? Work in a pressurized cabin or room. ? Scuba dive.  Do not use any products that contain nicotine or tobacco, such as cigarettes and e-cigarettes. If you need help quitting, ask your health care provider.  Keep your ears dry. Wear fitted earplugs during showering and bathing. Dry your ears completely after. General instructions  Take over-the-counter   and prescription medicines only as told by your health care provider.  Use techniques to help pop your ears as recommended by your health care provider. These may include: ? Chewing gum. ? Yawning. ? Frequent, forceful swallowing. ? Closing your mouth, holding your nose closed, and gently blowing as if you are trying to blow air out of your nose.  Keep all  follow-up visits as told by your health care provider. This is important. Contact a health care provider if:  Your symptoms do not go away after treatment.  Your symptoms come back after treatment.  You are unable to pop your ears.  You have: ? A fever. ? Pain in your ear. ? Pain in your head or neck. ? Fluid draining from your ear.  Your hearing suddenly changes.  You become very dizzy.  You lose your balance. Summary  Eustachian tube dysfunction refers to a condition in which a blockage develops in the eustachian tube.  It can be caused by ear infections, allergies, inhaled irritants, or abnormal growths in the nose or throat.  Symptoms include ear pain, hearing loss, or ringing in the ears.  Mild cases are treated with maneuvers to unblock the ears, such as yawning or ear popping.  Severe cases are treated with medicines. Surgery may also be done (rare). This information is not intended to replace advice given to you by your health care provider. Make sure you discuss any questions you have with your health care provider. Document Revised: 06/15/2017 Document Reviewed: 06/15/2017 Elsevier Patient Education  2020 [employer] Nonallergic Rhinitis Nonallergic rhinitis is a condition that causes symptoms that affect the nose, such as a runny nose and a stuffed-up nose (nasal congestion) that can make it hard to breathe through the nose. This condition is different from having an allergy (allergic rhinitis). Allergic rhinitis occurs when the body's defense system (immune system) reacts to a substance that you are allergic to (allergen), such as pollen, pet dander, mold, or dust. Nonallergic rhinitis has many similar symptoms, but it is not caused by allergens. Nonallergic rhinitis can be a short-term or long-term problem. What are the causes? This condition can be caused by many different things. Some common types of nonallergic rhinitis include: Infectious rhinitis  This is  usually due to an infection in the upper respiratory tract. Vasomotor rhinitis  This is the most common type of long-term nonallergic rhinitis.  It is caused by too much blood flow through the nose, which makes the tissue inside of the nose swell.  Symptoms are often triggered by strong odors, cold air, stress, drinking alcohol, cigarette smoke, or changes in the weather. Occupational rhinitis  This type is caused by triggers in the workplace, such as chemicals, dusts, animal dander, or air pollution. Hormonal rhinitis  This type occurs in women as a result of an increase in the female hormone estrogen.  It may occur during pregnancy, puberty, and menstrual cycles.  Symptoms improve when estrogen levels drop. Drug-induced rhinitis Several drugs can cause nonallergic rhinitis, including:  Medicines that are used to treat high blood pressure, heart disease, and Parkinson disease.  Aspirin and NSAIDs.  Over-the-counter nasal decongestant sprays. These can cause a type of nonallergic rhinitis (rhinitis medicamentosa) when they are used for more than a few days. Nonallergic rhinitis with eosinophilia syndrome (NARES)  This type is caused by having too much of a certain type of white blood cell (eosinophil). Nonallergic rhinitis can also be caused by a reaction to eating hot or spicy   foods. This does not usually cause long-term symptoms. In some cases, the cause of nonallergic rhinitis is not known. What increases the risk? You are more likely to develop this condition if:  You are 30-60 years of age.  You are a woman. Women are twice as likely to have this condition. What are the signs or symptoms? Common symptoms of this condition include:  Nasal congestion.  Runny nose.  The feeling of mucus going down the back of the throat (postnasal drip).  Trouble sleeping at night and daytime sleepiness. Less common symptoms include:  Sneezing.  Coughing.  Itchy  nose.  Bloodshot eyes. How is this diagnosed? This condition may be diagnosed based on:  Your symptoms and medical history.  A physical exam.  Allergy testing to rule out allergic rhinitis. You may have skin tests or blood tests. In some cases, the health care provider may take a swab of nasal secretions to look for an increased number of eosinophils. This would be done to confirm a diagnosis of NARES. How is this treated? Treatment for this condition depends on the cause. No single treatment works for everyone. Work with your health care provider to find the best treatment for you. Treatment may include:  Avoiding the things that trigger your symptoms.  Using medicines to relieve congestion, such as: ? Steroid nasal spray. There are many types. You may need to try a few to find out which one works best. ? Decongestant medicine. This may be an oral medicine or a nasal spray. These medicines are only used for a short time.  Using medicines to relieve a runny nose. These may include antihistamine medicines or anticholinergic nasal sprays.  Surgery to remove tissue from inside the nose may be needed in severe cases if the condition has not improved after 6-12 months of medical treatment. Follow these instructions at home:  Take or use over-the-counter and prescription medicines only as told by your health care provider. Do not stop using your medicine even if you start to feel better.  Use salt-water (saline) rinses or other solutions (nasal washes or irrigations) to wash or rinse out the inside of your nose as told by your health care provider.  Do not take NSAIDs or medicines that contain aspirin if they make your symptoms worse.  Do not drink alcohol if it makes your symptoms worse.  Do not use any tobacco products, such as cigarettes, chewing tobacco, and e-cigarettes. If you need help quitting, ask your health care provider.  Avoid secondhand smoke.  Get some exercise every  day. Exercise may help reduce symptoms of nonallergic rhinitis for some people. Ask your health care provider how much exercise and what types of exercise are safe for you.  Sleep with the head of your bed raised (elevated). This may reduce nighttime nasal congestion.  Keep all follow-up visits as told by your health care provider. This is important. Contact a health care provider if:  You have a fever.  Your symptoms are getting worse at home.  Your symptoms are not responding to medicine.  You develop new symptoms, especially a headache or nosebleed. This information is not intended to replace advice given to you by your health care provider. Make sure you discuss any questions you have with your health care provider. Document Revised: 02/05/2017 Document Reviewed: 05/16/2015 Elsevier Patient Education  2020 [employer] Allergic Rhinitis, Adult Allergic rhinitis is an allergic reaction that affects the mucous membrane inside the nose. It causes sneezing, a runny   or stuffy nose, and the feeling of mucus going down the back of the throat (postnasal drip). Allergic rhinitis can be mild to severe. There are two types of allergic rhinitis:  Seasonal. This type is also called hay fever. It happens only during certain seasons.  Perennial. This type can happen at any time of the year. What are the causes? This condition happens when the body's defense system (immune system) responds to certain harmless substances called allergens as though they were germs.  Seasonal allergic rhinitis is triggered by pollen, which can come from grasses, trees, and weeds. Perennial allergic rhinitis may be caused by:  House dust mites.  Pet dander.  Mold spores. What are the signs or symptoms? Symptoms of this condition include:  Sneezing.  Runny or stuffy nose (nasal congestion).  Postnasal drip.  Itchy nose.  Tearing of the eyes.  Trouble sleeping.  Daytime sleepiness. How is this  diagnosed? This condition may be diagnosed based on:  Your medical history.  A physical exam.  Tests to check for related conditions, such as: ? Asthma. ? Pink eye. ? Ear infection. ? Upper respiratory infection.  Tests to find out which allergens trigger your symptoms. These may include skin or blood tests. How is this treated? There is no cure for this condition, but treatment can help control symptoms. Treatment may include:  Taking medicines that block allergy symptoms, such as antihistamines. Medicine may be given as a shot, nasal spray, or pill.  Avoiding the allergen.  Desensitization. This treatment involves getting ongoing shots until your body becomes less sensitive to the allergen. This treatment may be done if other treatments do not help.  If taking medicine and avoiding the allergen does not work, new, stronger medicines may be prescribed. Follow these instructions at home:  Find out what you are allergic to. Common allergens include smoke, dust, and pollen.  Avoid the things you are allergic to. These are some things you can do to help avoid allergens: ? Replace carpet with wood, tile, or vinyl flooring. Carpet can trap dander and dust. ? Do not smoke. Do not allow smoking in your home. ? Change your heating and air conditioning filter at least once a month. ? During allergy season:  Keep windows closed as much as possible.  Plan outdoor activities when pollen counts are lowest. This is usually during the evening hours.  When coming indoors, change clothing and shower before sitting on furniture or bedding.  Take over-the-counter and prescription medicines only as told by your health care provider.  Keep all follow-up visits as told by your health care provider. This is important. Contact a health care provider if:  You have a fever.  You develop a persistent cough.  You make whistling sounds when you breathe (you wheeze).  Your symptoms interfere  with your normal daily activities. Get help right away if:  You have shortness of breath. Summary  This condition can be managed by taking medicines as directed and avoiding allergens.  Contact your health care provider if you develop a persistent cough or fever.  During allergy season, keep windows closed as much as possible. This information is not intended to replace advice given to you by your health care provider. Make sure you discuss any questions you have with your health care provider. Document Revised: 02/05/2017 Document Reviewed: 04/02/2016 Elsevier Patient Education  2020 [employer] How to Perform a Sinus Rinse A sinus rinse is a home treatment that is used to rinse your   sinuses with a sterile mixture of salt and water (saline solution). Sinuses are air-filled spaces in your skull behind the bones of your face and forehead that open into your nasal cavity. A sinus rinse can help to clear mucus, dirt, dust, or pollen from your nasal cavity. You may do a sinus rinse when you have a cold, a virus, nasal allergy symptoms, a sinus infection, or stuffiness in your nose or sinuses. Talk with your health care provider about whether a sinus rinse might help you. What are the risks? A sinus rinse is generally safe and effective. However, there are a few risks, which include:  A burning sensation in your sinuses. This may happen if you do not make the saline solution as directed. Be sure to follow all directions when making the saline solution.  Nasal irritation.  Infection from contaminated water. This is rare, but possible. Do not do a sinus rinse if you have had ear or nasal surgery, ear infection, or blocked ears. Supplies needed:  Saline solution or powder.  Distilled or sterile water may be needed to mix with saline powder. ? You may use boiled and cooled tap water. Boil tap water for 5 minutes; cool until it is lukewarm. Use within 24 hours. ? Do not use regular tap  water to mix with the saline solution.  Neti pot or nasal rinse bottle. These supplies release the saline solution into your nose and through your sinuses. Neti pots and nasal rinse bottles can be purchased at your local pharmacy, a health food store, or online. How to perform a sinus rinse  1. Wash your hands with soap and water. 2. Wash your device according to the directions that came with the product and then dry it. 3. Use the solution that comes with your product or one that is sold separately in stores. Follow the mixing directions on the package if you need to mix with sterile or distilled water. 4. Fill the device with the amount of saline solution noted in the device instructions. 5. Stand over a sink and tilt your head sideways over the sink. 6. Place the spout of the device in your upper nostril (the one closer to the ceiling). 7. Gently pour or squeeze the saline solution into your nasal cavity. The liquid should drain out from the lower nostril if you are not too congested. 8. While rinsing, breathe through your open mouth. 9. Gently blow your nose to clear any mucus and rinse solution. Blowing too hard may cause ear pain. 10. Repeat in your other nostril. 11. Clean and rinse your device with clean water and then air-dry it. Talk with your health care provider or pharmacist if you have questions about how to do a sinus rinse. Summary  A sinus rinse is a home treatment that is used to rinse your sinuses with a sterile mixture of salt and water (saline solution).  A sinus rinse is generally safe and effective. Follow all instructions carefully.  Before doing a sinus rinse, talk with your health care provider about whether it would be helpful for you. This information is not intended to replace advice given to you by your health care provider. Make sure you discuss any questions you have with your health care provider. Document Revised: 12/21/2016 Document Reviewed:  12/21/2016 Elsevier Patient Education  2020 [employer] Sinusitis, Adult Sinusitis is inflammation of your sinuses. Sinuses are hollow spaces in the bones around your face. Your sinuses are located:  Around your eyes.    In the middle of your forehead.  Behind your nose.  In your cheekbones. Mucus normally drains out of your sinuses. When your nasal tissues become inflamed or swollen, mucus can become trapped or blocked. This allows bacteria, viruses, and fungi to grow, which leads to infection. Most infections of the sinuses are caused by a virus. Sinusitis can develop quickly. It can last for up to 4 weeks (acute) or for more than 12 weeks (chronic). Sinusitis often develops after a cold. What are the causes? This condition is caused by anything that creates swelling in the sinuses or stops mucus from draining. This includes:  Allergies.  Asthma.  Infection from bacteria or viruses.  Deformities or blockages in your nose or sinuses.  Abnormal growths in the nose (nasal polyps).  Pollutants, such as chemicals or irritants in the air.  Infection from fungi (rare). What increases the risk? You are more likely to develop this condition if you:  Have a weak body defense system (immune system).  Do a lot of swimming or diving.  Overuse nasal sprays.  Smoke. What are the signs or symptoms? The main symptoms of this condition are pain and a feeling of pressure around the affected sinuses. Other symptoms include:  Stuffy nose or congestion.  Thick drainage from your nose.  Swelling and warmth over the affected sinuses.  Headache.  Upper toothache.  A cough that may get worse at night.  Extra mucus that collects in the throat or the back of the nose (postnasal drip).  Decreased sense of smell and taste.  Fatigue.  A fever.  Sore throat.  Bad breath. How is this diagnosed? This condition is diagnosed based on:  Your symptoms.  Your medical history.  A  physical exam.  Tests to find out if your condition is acute or chronic. This may include: ? Checking your nose for nasal polyps. ? Viewing your sinuses using a device that has a light (endoscope). ? Testing for allergies or bacteria. ? Imaging tests, such as an MRI or CT scan. In rare cases, a bone biopsy may be done to rule out more serious types of fungal sinus disease. How is this treated? Treatment for sinusitis depends on the cause and whether your condition is chronic or acute.  If caused by a virus, your symptoms should go away on their own within 10 days. You may be given medicines to relieve symptoms. They include: ? Medicines that shrink swollen nasal passages (topical intranasal decongestants). ? Medicines that treat allergies (antihistamines). ? A spray that eases inflammation of the nostrils (topical intranasal corticosteroids). ? Rinses that help get rid of thick mucus in your nose (nasal saline washes).  If caused by bacteria, your health care provider may recommend waiting to see if your symptoms improve. Most bacterial infections will get better without antibiotic medicine. You may be given antibiotics if you have: ? A severe infection. ? A weak immune system.  If caused by narrow nasal passages or nasal polyps, you may need to have surgery. Follow these instructions at home: Medicines  Take, use, or apply over-the-counter and prescription medicines only as told by your health care provider. These may include nasal sprays.  If you were prescribed an antibiotic medicine, take it as told by your health care provider. Do not stop taking the antibiotic even if you start to feel better. Hydrate and humidify   Drink enough fluid to keep your urine pale yellow. Staying hydrated will help to thin your mucus.  Use   a cool mist humidifier to keep the humidity level in your home above 50%.  Inhale steam for 10-15 minutes, 3-4 times a day, or as told by your health care  provider. You can do this in the bathroom while a hot shower is running.  Limit your exposure to cool or dry air. Rest  Rest as much as possible.  Sleep with your head raised (elevated).  Make sure you get enough sleep each night. General instructions   Apply a warm, moist washcloth to your face 3-4 times a day or as told by your health care provider. This will help with discomfort.  Wash your hands often with soap and water to reduce your exposure to germs. If soap and water are not available, use hand sanitizer.  Do not smoke. Avoid being around people who are smoking (secondhand smoke).  Keep all follow-up visits as told by your health care provider. This is important. Contact a health care provider if:  You have a fever.  Your symptoms get worse.  Your symptoms do not improve within 10 days. Get help right away if:  You have a severe headache.  You have persistent vomiting.  You have severe pain or swelling around your face or eyes.  You have vision problems.  You develop confusion.  Your neck is stiff.  You have trouble breathing. Summary  Sinusitis is soreness and inflammation of your sinuses. Sinuses are hollow spaces in the bones around your face.  This condition is caused by nasal tissues that become inflamed or swollen. The swelling traps or blocks the flow of mucus. This allows bacteria, viruses, and fungi to grow, which leads to infection.  If you were prescribed an antibiotic medicine, take it as told by your health care provider. Do not stop taking the antibiotic even if you start to feel better.  Keep all follow-up visits as told by your health care provider. This is important. This information is not intended to replace advice given to you by your health care provider. Make sure you discuss any questions you have with your health care provider. Document Revised: 07/26/2017 Document Reviewed: 07/26/2017 Elsevier Patient Education  2020 [employer]  

## 2019-09-19 NOTE — Progress Notes (Signed)
Subjective:    Patient ID: Sandra Lara, female    DOB: [birth year], 59 y.o.   MRN: [id number]  59y/o Caucasian established female pt c/o PND with frequent throat clearing and intermittent cough x4 days. Using Nyquil/Dayquil at home with mild relief. Restarted antihistamine, either Allegra or Xyzal last week, as well as Azelastine nasal spray. RN saw pt in clinic yesterday and provided with multi-sx cold relief tabs as she had not taken any meds yesterday. Also gave a bottle of nasal saline spray from [place]. Since yesterday, sx worsened to sinus pain, maxillary and frontal on L side with L upper dental pain.  Patient has been working on weight loss utilizing noom program and has been having success, feeling better and eating healthier choices e.g. fruit for dessert.  Her goal to lose 60lbs this year and has lost greater than 10 lbs since starting program.       Review of Systems  Constitutional: Negative for activity change, appetite change, chills, diaphoresis, fatigue, fever and unexpected weight change.  HENT: Positive for congestion, postnasal drip, rhinorrhea, sinus pressure, sinus pain, sneezing and sore throat. Negative for dental problem, drooling, ear discharge, ear pain, facial swelling, hearing loss, mouth sores, nosebleeds, tinnitus, trouble swallowing and voice change.   Eyes: Negative for photophobia, pain, discharge, redness, itching and visual disturbance.  Respiratory: Positive for cough. Negative for choking, shortness of breath, wheezing and stridor.   Cardiovascular: Negative for chest pain.  Gastrointestinal: Negative for abdominal pain, diarrhea, nausea and vomiting.  Endocrine: Negative for cold intolerance and heat intolerance.  Genitourinary: Negative for difficulty urinating.  Skin: Negative for rash.  Allergic/Immunologic: Positive for environmental allergies. Negative for food allergies.  Neurological: Negative for dizziness, tremors, seizures, syncope,  facial asymmetry, speech difficulty, weakness, light-headedness, numbness and headaches.  Hematological: Negative for adenopathy. Does not bruise/bleed easily.  Psychiatric/Behavioral: Negative for agitation, confusion and sleep disturbance.       Objective:   Physical Exam Vitals and nursing note reviewed.  Constitutional:      General: She is awake. She is not in acute distress.    Appearance: Normal appearance. She is well-developed and well-groomed. She is obese. She is ill-appearing. She is not toxic-appearing or diaphoretic.  HENT:     Head: Normocephalic and atraumatic.     Jaw: There is normal jaw occlusion. No trismus.     Salivary Glands: Right salivary gland is not diffusely enlarged or tender. Left salivary gland is not diffusely enlarged or tender.     Right Ear: Hearing, ear canal and external ear normal. A middle ear effusion is present. There is no impacted cerumen.     Left Ear: Hearing, ear canal and external ear normal. A middle ear effusion is present. There is no impacted cerumen.     Nose: Mucosal edema, congestion and rhinorrhea present. No nasal deformity, septal deviation or laceration.     Right Turbinates: Enlarged and swollen. Not pale.     Left Turbinates: Enlarged and swollen. Not pale.     Right Sinus: Maxillary sinus tenderness and frontal sinus tenderness present.     Left Sinus: Maxillary sinus tenderness and frontal sinus tenderness present.     Mouth/Throat:     Lips: Pink. No lesions.     Mouth: Mucous membranes are moist. Mucous membranes are not pale, not dry and not cyanotic. No lacerations, oral lesions or angioedema.     Dentition: No dental tenderness, gingival swelling, dental caries, dental abscesses or gum lesions.  Tongue: No lesions. Tongue does not deviate from midline.     Palate: No mass and lesions.     Pharynx: Uvula midline. Pharyngeal swelling and posterior oropharyngeal erythema present. No oropharyngeal exudate or uvula  swelling.     Tonsils: No tonsillar exudate or tonsillar abscesses. 0 on the right. 0 on the left.     Comments: Cobblestoning posterior pharynx; bilateral TMs air fluid level clear; bilateral nasal turbinates edema erythema clear discharge; bilateral allergic shiners, nasal congestion and frequent throat clearing noted in clinic Eyes:     General: Lids are normal. Vision grossly intact. Gaze aligned appropriately. Allergic shiner present. No visual field deficit or scleral icterus.       Right eye: No foreign body, discharge or hordeolum.        Left eye: No foreign body, discharge or hordeolum.     Extraocular Movements: Extraocular movements intact.     Right eye: Normal extraocular motion and no nystagmus.     Left eye: Normal extraocular motion and no nystagmus.     Conjunctiva/sclera: Conjunctivae normal.     Right eye: Right conjunctiva is not injected. No chemosis, exudate or hemorrhage.    Left eye: Left conjunctiva is not injected. No chemosis, exudate or hemorrhage.    Pupils: Pupils are equal, round, and reactive to light. Pupils are equal.     Right eye: Pupil is round and reactive.     Left eye: Pupil is round and reactive.  Neck:     Thyroid: No thyroid mass, thyromegaly or thyroid tenderness.     Trachea: Trachea and phonation normal. No tracheal tenderness or tracheal deviation.  Cardiovascular:     Rate and Rhythm: Normal rate and regular rhythm.     Chest Wall: PMI is not displaced.     Pulses: Normal pulses.          Radial pulses are 2+ on the right side and 2+ on the left side.     Heart sounds: Normal heart sounds, S1 normal and S2 normal. No murmur heard.  No friction rub. No gallop.   Pulmonary:     Effort: Pulmonary effort is normal. No accessory muscle usage or respiratory distress.     Breath sounds: Normal breath sounds and air entry. No stridor, decreased air movement or transmitted upper airway sounds. No decreased breath sounds, wheezing, rhonchi or rales.      Comments: Wearing cloth mask due to covid pandemic; spoke full sentences without difficulty no cough observed in clinic Chest:     Chest wall: No tenderness.  Abdominal:     General: There is no distension.     Palpations: Abdomen is soft.  Musculoskeletal:        General: No swelling, tenderness, deformity or signs of injury. Normal range of motion.     Right shoulder: Normal.     Left shoulder: Normal.     Right elbow: Normal.     Left elbow: Normal.     Right hand: Normal.     Left hand: Normal.     Cervical back: Normal, normal range of motion and neck supple. No swelling, edema, deformity, erythema, signs of trauma, lacerations, rigidity, spasms, torticollis, tenderness or crepitus. No pain with movement or muscular tenderness. Normal range of motion.     Thoracic back: Normal.     Lumbar back: Normal.     Right hip: Normal.     Left hip: Normal.     Right knee: Normal.  Left knee: Normal.  Lymphadenopathy:     Head:     Right side of head: No submental, submandibular, tonsillar, preauricular, posterior auricular or occipital adenopathy.     Left side of head: No submental, submandibular, tonsillar, preauricular, posterior auricular or occipital adenopathy.     Cervical: No cervical adenopathy.     Right cervical: No superficial, deep or posterior cervical adenopathy.    Left cervical: No superficial, deep or posterior cervical adenopathy.  Skin:    General: Skin is warm and dry.     Capillary Refill: Capillary refill takes less than 2 seconds.     Coloration: Skin is not ashen, cyanotic, jaundiced, mottled, pale or sallow.     Findings: No abrasion, abscess, acne, bruising, burn, ecchymosis, erythema, signs of injury, laceration, lesion, petechiae, rash or wound.     Nails: There is no clubbing.  Neurological:     General: No focal deficit present.     Mental Status: She is alert and oriented to person, place, and time. Mental status is at baseline. She is not  disoriented.     GCS: GCS eye subscore is 4. GCS verbal subscore is 5. GCS motor subscore is 6.     Cranial Nerves: Cranial nerves are intact. No cranial nerve deficit, dysarthria or facial asymmetry.     Sensory: Sensation is intact. No sensory deficit.     Motor: Motor function is intact. No weakness, tremor, atrophy, abnormal muscle tone or seizure activity.     Coordination: Coordination is intact. Coordination normal.     Gait: Gait is intact. Gait normal.     Comments: Gait sure and steady in clinic; on/off exam table and in/out of chair without difficulty; bilateral hand grasp equal 5/5  Psychiatric:        Attention and Perception: Attention and perception normal.        Mood and Affect: Mood and affect normal.        Speech: Speech normal.        Behavior: Behavior normal. Behavior is cooperative.        Thought Content: Thought content normal.        Cognition and Memory: Cognition and memory normal.        Judgment: Judgment normal.      Frequent nasal/throat clearing, cobblestoning posterior pharynx; bilateral TMs air fluid level clear; bilateral allergic shiners; nasal turbinates edema/erythema clear discharge     Assessment & Plan:  A-acute rhinosinusitis  P-prednisone 10mg  sig t2 po qam with breakfast x 3 days #21 RF0 dispensed from [facility] to patient.  Patient to follow up Thursday 09/21/2019 for re-evaluation to either stop/lengthen prednisone or start amoxicillin if no improvement with prednisone plus continuing rest of plan of care.  Since discharge clear/no fever will start with prednisone per patient preference.  Wear mask to decrease irritant exposure at work/when out in community.  Increase use of nasal saline.  Patient may use normal saline nasal spray 2 sprays each nostril q2h wa as needed. Continue azelastine 116mcg 1 spray each nostril BID.  Patient denied personal or family history of ENT cancer.  OTC antihistamine of choice claritin 10mg  po daily. Restart singulair  10mg  po qhs.  May continue phenylephrine 5-10mg  po q6h prn rhinitis.  Tylenol 1000mg  po qid prn pain.    Avoid triggers if possible.  Shower prior to bedtime if exposed to triggers.  If allergic dust/dust mites recommend mattress/pillow covers/encasements; washing linens, vacuuming, sweeping, dusting weekly.  Call or return to  clinic as needed if these symptoms worsen or fail to improve as anticipated.   Exitcare handout on allergic rhinitis, sinusitis and sinus rinse given to patient.  Patient verbalized understanding of instructions, agreed with plan of care and had no further questions at this time.  P2:  Avoidance and hand washing.

## 2019-09-21 ENCOUNTER — Telehealth: Payer: Self-pay | Admitting: Registered Nurse

## 2019-09-21 NOTE — Telephone Encounter (Signed)
Patient reported 3 days of prednisone resolved her symptoms and feeling much better.  DIscussed with patient if worsening over the weekend could repeat 3 day 20mg  po qam prednisone again as she has left over pills from [facility] dispense 09/19/2019.  Follow up if fever/chills, cloudy mucous or no improvement with symptoms of sinusitis e.g. pain/pressure/post nasal drip in future.  Continue nasal saline and seasonal allergic rhinitis medications as previously discussed 09/19/2019.  Patient verbalized understanding information/instructions, agreed with plan of care and had no further questions at this time.

## 2019-10-19 ENCOUNTER — Ambulatory Visit: Payer: Self-pay | Admitting: Registered Nurse

## 2019-10-19 ENCOUNTER — Encounter: Payer: Self-pay | Admitting: Registered Nurse

## 2019-10-19 ENCOUNTER — Other Ambulatory Visit: Payer: Self-pay

## 2019-10-19 VITALS — BP 114/80 | HR 60 | Temp 98.2°F

## 2019-10-19 DIAGNOSIS — J0111 Acute recurrent frontal sinusitis: Secondary | ICD-10-CM

## 2019-10-19 DIAGNOSIS — J301 Allergic rhinitis due to pollen: Secondary | ICD-10-CM

## 2019-10-19 DIAGNOSIS — H8110 Benign paroxysmal vertigo, unspecified ear: Secondary | ICD-10-CM

## 2019-10-19 MED ORDER — AMOXICILLIN 875 MG PO TABS: 875.0000 mg | ORAL_TABLET | Freq: Two times a day (BID) | ORAL | 0 refills | Status: DC

## 2019-10-19 MED ORDER — SALINE SPRAY 0.65 % NA SOLN
2.0000 | NASAL | 0 refills | Status: DC
Start: 2019-10-19 — End: 2020-05-29

## 2019-10-19 NOTE — Progress Notes (Signed)
Subjective:    Patient ID: Sandra Lara, female    DOB: [birth year], 59 y.o.   MRN: [id number]  59y/o established Caucasian female pt c/o sinus pain/pressure frontal and maxillary, balance issues/vertigo, rhinorrhea cloudy yellow in ams, nasal congestion, sneezing, mild nonproductive cough. Denies sore throat, fever/chills, n/v/d, otalgia, ShOB. Was seen for same 7/13 and started Prednisone. Felt better after course but didn't feel like it completely cleared up. Using azelastine spray, saline spray and claritin at home. Stopped singulair as making her too drowsy day after.  Using saline only once a day.  Last amoxicillin use 2019 worked well for her.  Patient reported noom weight loss program working well for her down 26 lbs so far.     Review of Systems  Constitutional: Negative for activity change, appetite change, chills, diaphoresis, fatigue, fever and unexpected weight change.  HENT: Positive for congestion, postnasal drip, rhinorrhea, sinus pressure, sinus pain and sneezing. Negative for dental problem, drooling, ear discharge, ear pain, facial swelling, hearing loss, mouth sores, nosebleeds, sore throat, trouble swallowing and voice change.   Eyes: Negative for photophobia, pain, discharge, redness, itching and visual disturbance.  Respiratory: Positive for cough. Negative for shortness of breath, wheezing and stridor.   Cardiovascular: Negative for palpitations.  Gastrointestinal: Negative for diarrhea, nausea and vomiting.  Endocrine: Negative for cold intolerance and heat intolerance.  Genitourinary: Negative for difficulty urinating.  Musculoskeletal: Negative for myalgias, neck pain and neck stiffness.  Skin: Negative for rash.  Allergic/Immunologic: Positive for environmental allergies. Negative for food allergies.  Neurological: Positive for dizziness and headaches. Negative for tremors, seizures, syncope, facial asymmetry, speech difficulty, weakness, light-headedness and  numbness.  Hematological: Negative for adenopathy. Does not bruise/bleed easily.  Psychiatric/Behavioral: Negative for agitation, confusion and sleep disturbance.       Objective:   Physical Exam Vitals and nursing note reviewed.  Constitutional:      General: She is not in acute distress.    Appearance: Normal appearance. She is well-developed and well-groomed. She is obese. She is not ill-appearing, toxic-appearing or diaphoretic.  HENT:     Head: Normocephalic and atraumatic.     Jaw: There is normal jaw occlusion. No trismus, tenderness, swelling, pain on movement or malocclusion.     Salivary Glands: Right salivary gland is not diffusely enlarged or tender. Left salivary gland is not diffusely enlarged or tender.     Right Ear: Hearing, ear canal and external ear normal. A middle ear effusion is present. There is no impacted cerumen.     Left Ear: Hearing, ear canal and external ear normal. A middle ear effusion is present. There is no impacted cerumen.     Nose: Mucosal edema, congestion and rhinorrhea present. No nasal deformity, septal deviation, signs of injury, laceration or nasal tenderness.     Right Turbinates: Enlarged and swollen. Not pale.     Left Turbinates: Enlarged and swollen. Not pale.     Right Sinus: Maxillary sinus tenderness and frontal sinus tenderness present.     Left Sinus: Maxillary sinus tenderness and frontal sinus tenderness present.     Mouth/Throat:     Lips: Pink. No lesions.     Mouth: Mucous membranes are moist. Mucous membranes are not pale, not dry and not cyanotic. No injury, lacerations, oral lesions or angioedema.     Dentition: No dental tenderness, gingival swelling, dental caries, dental abscesses or gum lesions.     Tongue: No lesions. Tongue does not deviate from midline.  Pharynx: Uvula midline. Pharyngeal swelling and posterior oropharyngeal erythema present. No oropharyngeal exudate or uvula swelling.     Tonsils: No tonsillar  exudate or tonsillar abscesses. 0 on the right. 0 on the left.     Comments: Bilateral allergic shiners; cobblestoning posterior nasal pharynx; bilateral TMs air fluid level clear; nasal congestion/sniffing in exam room; frontal greater than maxillary TTP and left greater than right Eyes:     General: Lids are normal. Vision grossly intact. Gaze aligned appropriately. Allergic shiner present. No visual field deficit or scleral icterus.       Right eye: No foreign body, discharge or hordeolum.        Left eye: No foreign body, discharge or hordeolum.     Extraocular Movements: Extraocular movements intact.     Right eye: Normal extraocular motion and no nystagmus.     Left eye: Normal extraocular motion and no nystagmus.     Conjunctiva/sclera: Conjunctivae normal.     Right eye: Right conjunctiva is not injected. No chemosis, exudate or hemorrhage.    Left eye: Left conjunctiva is not injected. No chemosis, exudate or hemorrhage.    Pupils: Pupils are equal, round, and reactive to light. Pupils are equal.     Right eye: Pupil is round and reactive.     Left eye: Pupil is round and reactive.  Neck:     Thyroid: No thyroid mass, thyromegaly or thyroid tenderness.     Trachea: Trachea and phonation normal. No tracheal tenderness or tracheal deviation.  Cardiovascular:     Rate and Rhythm: Normal rate and regular rhythm.     Chest Wall: PMI is not displaced.     Pulses: Normal pulses.          Radial pulses are 2+ on the right side and 2+ on the left side.     Heart sounds: Normal heart sounds, S1 normal and S2 normal. No murmur heard.  No friction rub. No gallop.   Pulmonary:     Effort: Pulmonary effort is normal. No accessory muscle usage or respiratory distress.     Breath sounds: Normal breath sounds and air entry. No stridor, decreased air movement or transmitted upper airway sounds. No decreased breath sounds, wheezing, rhonchi or rales.     Comments: Wearing reusable cloth mask due to  covid pandemic; spoke full sentences without difficulty; no cough noted in exam room Chest:     Chest wall: No tenderness.  Abdominal:     General: There is no distension.     Palpations: Abdomen is soft.  Musculoskeletal:        General: No swelling, tenderness, deformity or signs of injury. Normal range of motion.     Right shoulder: Normal.     Left shoulder: Normal.     Right hand: Normal.     Left hand: Normal.     Cervical back: Normal range of motion and neck supple. No edema, erythema, signs of trauma, rigidity, torticollis, tenderness or crepitus. No pain with movement, spinous process tenderness or muscular tenderness. Normal range of motion.     Right hip: Normal.     Left hip: Normal.     Right knee: Normal.     Left knee: Normal.  Lymphadenopathy:     Head:     Right side of head: No submental, submandibular, tonsillar, preauricular, posterior auricular or occipital adenopathy.     Left side of head: No submental, submandibular, tonsillar, preauricular, posterior auricular or occipital adenopathy.  Cervical: No cervical adenopathy.     Right cervical: No superficial, deep or posterior cervical adenopathy.    Left cervical: No superficial, deep or posterior cervical adenopathy.  Skin:    General: Skin is warm and dry.     Capillary Refill: Capillary refill takes less than 2 seconds.     Coloration: Skin is not ashen, cyanotic, jaundiced, mottled, pale or sallow.     Findings: No abrasion, abscess, acne, bruising, burn, ecchymosis, erythema, signs of injury, laceration, lesion, petechiae, rash or wound.     Nails: There is no clubbing.  Neurological:     General: No focal deficit present.     Mental Status: She is alert and oriented to person, place, and time. Mental status is at baseline. She is not disoriented.     GCS: GCS eye subscore is 4. GCS verbal subscore is 5. GCS motor subscore is 6.     Cranial Nerves: Cranial nerves are intact. No cranial nerve deficit,  dysarthria or facial asymmetry.     Sensory: Sensation is intact. No sensory deficit.     Motor: Motor function is intact. No weakness, tremor, atrophy, abnormal muscle tone or seizure activity.     Coordination: Coordination is intact. Coordination normal.     Gait: Gait is intact. Gait normal.     Comments: Gait sure and steady in clinic; in/out of chair and on/off exam table without difficulty; bilateral hand grasp equal 5/5  Psychiatric:        Attention and Perception: Attention and perception normal.        Mood and Affect: Mood and affect normal.        Speech: Speech normal.        Behavior: Behavior normal. Behavior is cooperative.        Thought Content: Thought content normal.        Cognition and Memory: Cognition and memory normal.        Judgment: Judgment normal.           Assessment & Plan:  A-acute recurrent frontal sinusitis, vertigo, seasonal allergic rhinitis  P- flonase 1 spray each nostril BID, saline 2 sprays each nostril q2h wa prn congestion given 1 bottle from clinic stock.  If no improvement with 48 hours of saline and flonase use start amoxicillin 875mg  po BID x 10 days #20 RF0 dispensed from [facility] to patient  Denied personal or family history of ENT cancer.  Shower BID especially prior to bed. No evidence of systemic bacterial infection, non toxic and well hydrated.  I do not see where any further testing or imaging is necessary at this time.   I will suggest supportive care, rest, good hygiene and encourage the patient to take adequate fluids.  The patient is to return to clinic or EMERGENCY ROOM if symptoms worsen or change significantly.  Exitcare handout on sinusitis and sinus rinse printed and given to patient.  Patient verbalized agreement and understanding of treatment plan and had no further questions at this time.   P2:  Hand washing and cover cough  Post nasal drip to eustachian tube dysfunction probably cause of vertigo.  If symptoms not improving  with sinusitis plan of care follow up for re-evaluation.  Avoid driving when having vertigo.  Failed singulair makes her too sleepy.  Patient may use normal saline nasal spray 2 sprays each nostril q2h wa as needed. flonase 74mcg 1 spray each nostril BID #1 RF0.  Patient denied personal or family history of  ENT cancer.  OTC antihistamine of choice claritin 10mg  po daily.  Avoid triggers if possible.  Shower prior to bedtime if exposed to triggers.  If allergic dust/dust mites recommend mattress/pillow covers/encasements; washing linens, vacuuming, sweeping, dusting weekly.  Call or return to clinic as needed if these symptoms worsen or fail to improve as anticipated.   Exitcare handout on allergic rhinitis and sinus rinse given to patient.  Patient verbalized understanding of instructions, agreed with plan of care and had no further questions at this time.  P2:  Avoidance and hand washing.

## 2019-10-19 NOTE — Patient Instructions (Signed)
Sinusitis, Adult Sinusitis is inflammation of your sinuses. Sinuses are hollow spaces in the bones around your face. Your sinuses are located:  Around your eyes.  In the middle of your forehead.  Behind your nose.  In your cheekbones. Mucus normally drains out of your sinuses. When your nasal tissues become inflamed or swollen, mucus can become trapped or blocked. This allows bacteria, viruses, and fungi to grow, which leads to infection. Most infections of the sinuses are caused by a virus. Sinusitis can develop quickly. It can last for up to 4 weeks (acute) or for more than 12 weeks (chronic). Sinusitis often develops after a cold. What are the causes? This condition is caused by anything that creates swelling in the sinuses or stops mucus from draining. This includes:  Allergies.  Asthma.  Infection from bacteria or viruses.  Deformities or blockages in your nose or sinuses.  Abnormal growths in the nose (nasal polyps).  Pollutants, such as chemicals or irritants in the air.  Infection from fungi (rare). What increases the risk? You are more likely to develop this condition if you:  Have a weak body defense system (immune system).  Do a lot of swimming or diving.  Overuse nasal sprays.  Smoke. What are the signs or symptoms? The main symptoms of this condition are pain and a feeling of pressure around the affected sinuses. Other symptoms include:  Stuffy nose or congestion.  Thick drainage from your nose.  Swelling and warmth over the affected sinuses.  Headache.  Upper toothache.  A cough that may get worse at night.  Extra mucus that collects in the throat or the back of the nose (postnasal drip).  Decreased sense of smell and taste.  Fatigue.  A fever.  Sore throat.  Bad breath. How is this diagnosed? This condition is diagnosed based on:  Your symptoms.  Your medical history.  A physical exam.  Tests to find out if your condition is  acute or chronic. This may include: ? Checking your nose for nasal polyps. ? Viewing your sinuses using a device that has a light (endoscope). ? Testing for allergies or bacteria. ? Imaging tests, such as an MRI or CT scan. In rare cases, a bone biopsy may be done to rule out more serious types of fungal sinus disease. How is this treated? Treatment for sinusitis depends on the cause and whether your condition is chronic or acute.  If caused by a virus, your symptoms should go away on their own within 10 days. You may be given medicines to relieve symptoms. They include: ? Medicines that shrink swollen nasal passages (topical intranasal decongestants). ? Medicines that treat allergies (antihistamines). ? A spray that eases inflammation of the nostrils (topical intranasal corticosteroids). ? Rinses that help get rid of thick mucus in your nose (nasal saline washes).  If caused by bacteria, your health care provider may recommend waiting to see if your symptoms improve. Most bacterial infections will get better without antibiotic medicine. You may be given antibiotics if you have: ? A severe infection. ? A weak immune system.  If caused by narrow nasal passages or nasal polyps, you may need to have surgery. Follow these instructions at home: Medicines  Take, use, or apply over-the-counter and prescription medicines only as told by your health care provider. These may include nasal sprays.  If you were prescribed an antibiotic medicine, take it as told by your health care provider. Do not stop taking the antibiotic even if you start   to feel better. Hydrate and humidify   Drink enough fluid to keep your urine pale yellow. Staying hydrated will help to thin your mucus.  Use a cool mist humidifier to keep the humidity level in your home above 50%.  Inhale steam for 10-15 minutes, 3-4 times a day, or as told by your health care provider. You can do this in the bathroom while a hot shower is  running.  Limit your exposure to cool or dry air. Rest  Rest as much as possible.  Sleep with your head raised (elevated).  Make sure you get enough sleep each night. General instructions   Apply a warm, moist washcloth to your face 3-4 times a day or as told by your health care provider. This will help with discomfort.  Wash your hands often with soap and water to reduce your exposure to germs. If soap and water are not available, use hand sanitizer.  Do not smoke. Avoid being around people who are smoking (secondhand smoke).  Keep all follow-up visits as told by your health care provider. This is important. Contact a health care provider if:  You have a fever.  Your symptoms get worse.  Your symptoms do not improve within 10 days. Get help right away if:  You have a severe headache.  You have persistent vomiting.  You have severe pain or swelling around your face or eyes.  You have vision problems.  You develop confusion.  Your neck is stiff.  You have trouble breathing. Summary  Sinusitis is soreness and inflammation of your sinuses. Sinuses are hollow spaces in the bones around your face.  This condition is caused by nasal tissues that become inflamed or swollen. The swelling traps or blocks the flow of mucus. This allows bacteria, viruses, and fungi to grow, which leads to infection.  If you were prescribed an antibiotic medicine, take it as told by your health care provider. Do not stop taking the antibiotic even if you start to feel better.  Keep all follow-up visits as told by your health care provider. This is important. This information is not intended to replace advice given to you by your health care provider. Make sure you discuss any questions you have with your health care provider. Document Revised: 07/26/2017 Document Reviewed: 07/26/2017 Elsevier Patient Education  [place]. How to Perform a Sinus Rinse A sinus rinse is a home  treatment that is used to rinse your sinuses with a sterile mixture of salt and water (saline solution). Sinuses are air-filled spaces in your skull behind the bones of your face and forehead that open into your nasal cavity. A sinus rinse can help to clear mucus, dirt, dust, or pollen from your nasal cavity. You may do a sinus rinse when you have a cold, a virus, nasal allergy symptoms, a sinus infection, or stuffiness in your nose or sinuses. Talk with your health care provider about whether a sinus rinse might help you. What are the risks? A sinus rinse is generally safe and effective. However, there are a few risks, which include:  A burning sensation in your sinuses. This may happen if you do not make the saline solution as directed. Be sure to follow all directions when making the saline solution.  Nasal irritation.  Infection from contaminated water. This is rare, but possible. Do not do a sinus rinse if you have had ear or nasal surgery, ear infection, or blocked ears. Supplies needed:  Saline solution or powder.  Distilled  or sterile water may be needed to mix with saline powder. ? You may use boiled and cooled tap water. Boil tap water for 5 minutes; cool until it is lukewarm. Use within 24 hours. ? Do not use regular tap water to mix with the saline solution.  Neti pot or nasal rinse bottle. These supplies release the saline solution into your nose and through your sinuses. Neti pots and nasal rinse bottles can be purchased at [employer], a health food store, or online. How to perform a sinus rinse  1. Wash your hands with soap and water. 2. Wash your device according to the directions that came with the product and then dry it. 3. Use the solution that comes with your product or one that is sold separately in stores. Follow the mixing directions on the package if you need to mix with sterile or distilled water. 4. Fill the device with the amount of saline solution noted  in the device instructions. 5. Stand over a sink and tilt your head sideways over the sink. 6. Place the spout of the device in your upper nostril (the one closer to the ceiling). 7. Gently pour or squeeze the saline solution into your nasal cavity. The liquid should drain out from the lower nostril if you are not too congested. 8. While rinsing, breathe through your open mouth. 9. Gently blow your nose to clear any mucus and rinse solution. Blowing too hard may cause ear pain. 10. Repeat in your other nostril. 11. Clean and rinse your device with clean water and then air-dry it. Talk with your health care provider or pharmacist if you have questions about how to do a sinus rinse. Summary  A sinus rinse is a home treatment that is used to rinse your sinuses with a sterile mixture of salt and water (saline solution).  A sinus rinse is generally safe and effective. Follow all instructions carefully.  Before doing a sinus rinse, talk with your health care provider about whether it would be helpful for you. This information is not intended to replace advice given to you by your health care provider. Make sure you discuss any questions you have with your health care provider. Document Revised: 12/21/2016 Document Reviewed: 12/21/2016 Elsevier Patient Education  2020 [employer].

## 2019-10-31 ENCOUNTER — Ambulatory Visit: Payer: Self-pay | Admitting: Registered Nurse

## 2019-10-31 ENCOUNTER — Encounter: Payer: Self-pay | Admitting: Registered Nurse

## 2019-10-31 ENCOUNTER — Other Ambulatory Visit: Payer: Self-pay

## 2019-10-31 VITALS — BP 122/86 | HR 56 | Temp 98.3°F | Resp 16

## 2019-10-31 DIAGNOSIS — J00 Acute nasopharyngitis [common cold]: Secondary | ICD-10-CM

## 2019-10-31 MED ORDER — AZELASTINE HCL 137 MCG/SPRAY NA SOLN: 1.0000 | Freq: Two times a day (BID) | NASAL | 11 refills | Status: DC

## 2019-10-31 MED ORDER — ALBUTEROL SULFATE HFA 108 (90 BASE) MCG/ACT IN AERS: 2.0000 | INHALATION_SPRAY | Freq: Four times a day (QID) | RESPIRATORY_TRACT | 1 refills | Status: AC | PRN

## 2019-10-31 MED ORDER — FLUTICASONE PROPIONATE 50 MCG/ACT NA SUSP: 1.0000 | Freq: Two times a day (BID) | NASAL | 0 refills | Status: DC

## 2019-10-31 NOTE — Patient Instructions (Signed)
How to Use a Metered Dose Inhaler A metered dose inhaler is a handheld device for taking medicine that must be breathed into the lungs (inhaled). The device can be used to deliver a variety of inhaled medicines, including:  Quick relief or rescue medicines, such as bronchodilators.  Controller medicines, such as corticosteroids. The medicine is delivered by pushing down on a metal canister to release a preset amount of spray and medicine. Each device contains the amount of medicine that is needed for a preset number of uses (inhalations). Your health care provider may recommend that you use a spacer with your inhaler to help you take the medicine more effectively. A spacer is a plastic tube with a mouthpiece on one end and an opening that connects to the inhaler on the other end. A spacer holds the medicine in a tube for a short time, which allows you to inhale more medicine. What are the risks? If you do not use your inhaler correctly, medicine might not reach your lungs to help you breathe. Inhaler medicine can cause side effects, such as:  Mouth or throat infection.  Cough.  Hoarseness.  Headache.  Nausea and vomiting.  Lung infection (pneumonia) in people who have a lung condition called COPD. How to use a metered dose inhaler without a spacer  1. Remove the cap from the inhaler. 2. If you are using the inhaler for the first time, shake it for 5 seconds, turn it away from your face, then release 4 puffs into the air. This is called priming. 3. Shake the inhaler for 5 seconds. 4. Position the inhaler so the top of the canister faces up. 5. Put your index finger on the top of the medicine canister. Support the bottom of the inhaler with your thumb. 6. Breathe out normally and as completely as possible, away from the inhaler. 7. Either place the inhaler between your teeth and close your lips tightly around the mouthpiece, or hold the inhaler 1-2 inches (2.5-5 cm) away from your open  mouth. Keep your tongue down out of the way. If you are unsure which technique to use, ask your health care provider. 8. Press the canister down with your index finger to release the medicine, then inhale deeply and slowly through your mouth (not your nose) until your lungs are completely filled. Inhaling should take 4-6 seconds. 9. Hold the medicine in your lungs for 5-10 seconds (10 seconds is best). This helps the medicine get into the small airways of your lungs. 10. With your lips in a tight circle (pursed), breathe out slowly. 11. Repeat steps 3-10 until you have taken the number of puffs that your health care provider directed. Wait about 1 minute between puffs or as directed. 12. Put the cap on the inhaler. 13. If you are using a steroid inhaler, rinse your mouth with water, gargle, and spit out the water. Do not swallow the water. How to use a metered dose inhaler with a spacer  1. Remove the cap from the inhaler. 2. If you are using the inhaler for the first time, shake it for 5 seconds, turn it away from your face, then release 4 puffs into the air. This is called priming. 3. Shake the inhaler for 5 seconds. 4. Place the open end of the spacer onto the inhaler mouthpiece. 5. Position the inhaler so the top of the canister faces up and the spacer mouthpiece faces you. 6. Put your index finger on the top of the medicine canister.   Support the bottom of the inhaler and the spacer with your thumb. 7. Breathe out normally and as completely as possible, away from the spacer. 8. Place the spacer between your teeth and close your lips tightly around it. Keep your tongue down out of the way. 9. Press the canister down with your index finger to release the medicine, then inhale deeply and slowly through your mouth (not your nose) until your lungs are completely filled. Inhaling should take 4-6 seconds. 10. Hold the medicine in your lungs for 5-10 seconds (10 seconds is best). This helps the  medicine get into the small airways of your lungs. 11. With your lips in a tight circle (pursed), breathe out slowly. 12. Repeat steps 3-11 until you have taken the number of puffs that your health care provider directed. Wait about 1 minute between puffs or as directed. 13. Remove the spacer from the inhaler and put the cap on the inhaler. 14. If you are using a steroid inhaler, rinse your mouth with water, gargle, and spit out the water. Do not swallow the water. Follow these instructions at home:  Take your inhaled medicine only as told by your health care provider. Do not use the inhaler more than directed by your health care provider.  Keep all follow-up visits as told by your health care provider. This is important.  If your inhaler has a counter, you can check it to determine how full your inhaler is. If your inhaler does not have a counter, ask your health care provider when you will need to refill your inhaler and write the refill date on a calendar or on your inhaler canister. Note that you cannot know when an inhaler is empty by shaking it.  Follow directions on the package insert for care and cleaning of your inhaler and spacer. Contact a health care provider if:  Symptoms are only partially relieved with your inhaler.  You are having trouble using your inhaler.  You have an increase in phlegm.  You have headaches. Get help right away if:  You feel little or no relief after using your inhaler.  You have dizziness.  You have a fast heart rate.  You have chills or a fever.  You have night sweats.  There is blood in your phlegm. Summary  A metered dose inhaler is a handheld device for taking medicine that must be breathed into the lungs (inhaled).  The medicine is delivered by pushing down on a metal canister to release a preset amount of spray and medicine.  Each device contains the amount of medicine that is needed for a preset number of uses (inhalations). This  information is not intended to replace advice given to you by your health care provider. Make sure you discuss any questions you have with your health care provider. Document Revised: 02/05/2017 Document Reviewed: 01/14/2016 Elsevier Patient Education  [place]. Acute Bronchitis, Adult  Acute bronchitis is sudden or acute swelling of the air tubes (bronchi) in the lungs. Acute bronchitis causes these tubes to fill with mucus, which can make it hard to breathe. It can also cause coughing or wheezing. In adults, acute bronchitis usually goes away within 2 weeks. A cough caused by bronchitis may last up to 3 weeks. Smoking, allergies, and asthma can make the condition worse. What are the causes? This condition can be caused by germs and by substances that irritate the lungs, including:  Cold and flu viruses. The most common cause of this condition is  the virus that causes the common cold.  Bacteria.  Substances that irritate the lungs, including: ? Smoke from cigarettes and other forms of tobacco. ? Dust and pollen. ? Fumes from chemical products, gases, or burned fuel. ? Other materials that pollute indoor or outdoor air.  Close contact with someone who has acute bronchitis. What increases the risk? The following factors may make you more likely to develop this condition:  A weak body's defense system, also called the immune system.  A condition that affects your lungs and breathing, such as asthma. What are the signs or symptoms? Common symptoms of this condition include:  Lung and breathing problems, such as: ? Coughing. This may bring up clear, yellow, or green mucus from your lungs (sputum). ? Wheezing. ? Having too much mucus in your lungs (chest congestion). ? Having shortness of breath.  A fever.  Chills.  Aches and pains, including: ? Tightness in your chest and other body aches. ? A sore throat. How is this diagnosed? This condition is usually diagnosed  based on:  Your symptoms and medical history.  A physical exam. You may also have other tests, including tests to rule out other conditions, such pneumonia. These tests include:  A test of lung function.  Test of a mucus sample to look for the presence of bacteria.  Tests to check the oxygen level in your blood.  Blood tests.  Chest X-ray. How is this treated? Most cases of acute bronchitis clear up over time without treatment. Your health care provider may recommend:  Drinking more fluids. This can thin your mucus, which may improve your breathing.  Taking a medicine for a fever or cough.  Using a device that gets medicine into your lungs (inhaler) to help improve breathing and control coughing.  Using a vaporizer or a humidifier. These are machines that add water to the air to help you breathe better. Follow these instructions at home: Activity  Get plenty of rest.  Return to your normal activities as told by your health care provider. Ask your health care provider what activities are safe for you. Lifestyle  Drink enough fluid to keep your urine pale yellow.  Do not drink alcohol.  Do not use any products that contain nicotine or tobacco, such as cigarettes, e-cigarettes, and chewing tobacco. If you need help quitting, ask your health care provider. Be aware that: ? Your bronchitis will get worse if you smoke or breathe in other people's smoke (secondhand smoke). ? Your lungs will heal faster if you quit smoking. General instructions   Take over-the-counter and prescription medicines only as told by your health care provider.  Use an inhaler, vaporizer, or humidifier as told by your health care provider.  If you have a sore throat, gargle with a salt-water mixture 3-4 times a day or as needed. To make a salt-water mixture, completely dissolve -1 tsp (3-6 g) of salt in 1 cup (237 mL) of warm water.  Keep all follow-up visits as told by your health care provider.  This is important. How is this prevented? To lower your risk of getting this condition again:  Wash your hands often with soap and water. If soap and water are not available, use hand sanitizer.  Avoid contact with people who have cold symptoms.  Try not to touch your mouth, nose, or eyes with your hands.  Avoid places where there are fumes from chemicals. Breathing these fumes will make your condition worse.  Get the flu shot every  year. Contact a health care provider if:  Your symptoms do not improve after 2 weeks of treatment.  You vomit more than once or twice.  You have symptoms of dehydration such as: ? Dark urine. ? Dry skin or eyes. ? Increased thirst. ? Headaches. ? Confusion. ? Muscle cramps. Get help right away if you:  Cough up blood.  Feel pain in your chest.  Have severe shortness of breath.  Faint or keep feeling like you are going to faint.  Have a severe headache.  Have fever or chills that get worse. These symptoms may represent a serious problem that is an emergency. Do not wait to see if the symptoms will go away. Get medical help right away. Call your local emergency services (911 in the [place] Do not drive yourself to the hospital. Summary  Acute bronchitis is sudden (acute) inflammation of the air tubes (bronchi) between the windpipe and the lungs. In adults, acute bronchitis usually goes away within 2 weeks, although coughing may last 3 weeks or longer  Take over-the-counter and prescription medicines only as told by your health care provider.  Drink enough fluid to keep your urine pale yellow.  Contact a health care provider if your symptoms do not improve after 2 weeks of treatment.  Get help right away if you cough up blood, faint, or have chest pain or shortness of breath. This information is not intended to replace advice given to you by your health care provider. Make sure you discuss any questions you have with your health care  provider. Document Revised: 11/07/2018 Document Reviewed: 09/16/2018 Elsevier Patient Education  [place]. Sinusitis, Adult Sinusitis is inflammation of your sinuses. Sinuses are hollow spaces in the bones around your face. Your sinuses are located:  Around your eyes.  In the middle of your forehead.  Behind your nose.  In your cheekbones. Mucus normally drains out of your sinuses. When your nasal tissues become inflamed or swollen, mucus can become trapped or blocked. This allows bacteria, viruses, and fungi to grow, which leads to infection. Most infections of the sinuses are caused by a virus. Sinusitis can develop quickly. It can last for up to 4 weeks (acute) or for more than 12 weeks (chronic). Sinusitis often develops after a cold. What are the causes? This condition is caused by anything that creates swelling in the sinuses or stops mucus from draining. This includes:  Allergies.  Asthma.  Infection from bacteria or viruses.  Deformities or blockages in your nose or sinuses.  Abnormal growths in the nose (nasal polyps).  Pollutants, such as chemicals or irritants in the air.  Infection from fungi (rare). What increases the risk? You are more likely to develop this condition if you:  Have a weak body defense system (immune system).  Do a lot of swimming or diving.  Overuse nasal sprays.  Smoke. What are the signs or symptoms? The main symptoms of this condition are pain and a feeling of pressure around the affected sinuses. Other symptoms include:  Stuffy nose or congestion.  Thick drainage from your nose.  Swelling and warmth over the affected sinuses.  Headache.  Upper toothache.  A cough that may get worse at night.  Extra mucus that collects in the throat or the back of the nose (postnasal drip).  Decreased sense of smell and taste.  Fatigue.  A fever.  Sore throat.  Bad breath. How is this diagnosed? This condition is diagnosed  based on:  Your symptoms.  Your medical history.  A physical exam.  Tests to find out if your condition is acute or chronic. This may include: ? Checking your nose for nasal polyps. ? Viewing your sinuses using a device that has a light (endoscope). ? Testing for allergies or bacteria. ? Imaging tests, such as an MRI or CT scan. In rare cases, a bone biopsy may be done to rule out more serious types of fungal sinus disease. How is this treated? Treatment for sinusitis depends on the cause and whether your condition is chronic or acute.  If caused by a virus, your symptoms should go away on their own within 10 days. You may be given medicines to relieve symptoms. They include: ? Medicines that shrink swollen nasal passages (topical intranasal decongestants). ? Medicines that treat allergies (antihistamines). ? A spray that eases inflammation of the nostrils (topical intranasal corticosteroids). ? Rinses that help get rid of thick mucus in your nose (nasal saline washes).  If caused by bacteria, your health care provider may recommend waiting to see if your symptoms improve. Most bacterial infections will get better without antibiotic medicine. You may be given antibiotics if you have: ? A severe infection. ? A weak immune system.  If caused by narrow nasal passages or nasal polyps, you may need to have surgery. Follow these instructions at home: Medicines  Take, use, or apply over-the-counter and prescription medicines only as told by your health care provider. These may include nasal sprays.  If you were prescribed an antibiotic medicine, take it as told by your health care provider. Do not stop taking the antibiotic even if you start to feel better. Hydrate and humidify   Drink enough fluid to keep your urine pale yellow. Staying hydrated will help to thin your mucus.  Use a cool mist humidifier to keep the humidity level in your home above 50%.  Inhale steam for 10-15  minutes, 3-4 times a day, or as told by your health care provider. You can do this in the bathroom while a hot shower is running.  Limit your exposure to cool or dry air. Rest  Rest as much as possible.  Sleep with your head raised (elevated).  Make sure you get enough sleep each night. General instructions   Apply a warm, moist washcloth to your face 3-4 times a day or as told by your health care provider. This will help with discomfort.  Wash your hands often with soap and water to reduce your exposure to germs. If soap and water are not available, use hand sanitizer.  Do not smoke. Avoid being around people who are smoking (secondhand smoke).  Keep all follow-up visits as told by your health care provider. This is important. Contact a health care provider if:  You have a fever.  Your symptoms get worse.  Your symptoms do not improve within 10 days. Get help right away if:  You have a severe headache.  You have persistent vomiting.  You have severe pain or swelling around your face or eyes.  You have vision problems.  You develop confusion.  Your neck is stiff.  You have trouble breathing. Summary  Sinusitis is soreness and inflammation of your sinuses. Sinuses are hollow spaces in the bones around your face.  This condition is caused by nasal tissues that become inflamed or swollen. The swelling traps or blocks the flow of mucus. This allows bacteria, viruses, and fungi to grow, which leads to infection.  If you were prescribed an antibiotic  medicine, take it as told by your health care provider. Do not stop taking the antibiotic even if you start to feel better.  Keep all follow-up visits as told by your health care provider. This is important. This information is not intended to replace advice given to you by your health care provider. Make sure you discuss any questions you have with your health care provider. Document Revised: 07/26/2017 Document Reviewed:  07/26/2017 Elsevier Patient Education  [place]. How to Perform a Sinus Rinse A sinus rinse is a home treatment that is used to rinse your sinuses with a sterile mixture of salt and water (saline solution). Sinuses are air-filled spaces in your skull behind the bones of your face and forehead that open into your nasal cavity. A sinus rinse can help to clear mucus, dirt, dust, or pollen from your nasal cavity. You may do a sinus rinse when you have a cold, a virus, nasal allergy symptoms, a sinus infection, or stuffiness in your nose or sinuses. Talk with your health care provider about whether a sinus rinse might help you. What are the risks? A sinus rinse is generally safe and effective. However, there are a few risks, which include:  A burning sensation in your sinuses. This may happen if you do not make the saline solution as directed. Be sure to follow all directions when making the saline solution.  Nasal irritation.  Infection from contaminated water. This is rare, but possible. Do not do a sinus rinse if you have had ear or nasal surgery, ear infection, or blocked ears. Supplies needed:  Saline solution or powder.  Distilled or sterile water may be needed to mix with saline powder. ? You may use boiled and cooled tap water. Boil tap water for 5 minutes; cool until it is lukewarm. Use within 24 hours. ? Do not use regular tap water to mix with the saline solution.  Neti pot or nasal rinse bottle. These supplies release the saline solution into your nose and through your sinuses. Neti pots and nasal rinse bottles can be purchased at [employer], a health food store, or online. How to perform a sinus rinse  1. Wash your hands with soap and water. 2. Wash your device according to the directions that came with the product and then dry it. 3. Use the solution that comes with your product or one that is sold separately in stores. Follow the mixing directions on the  package if you need to mix with sterile or distilled water. 4. Fill the device with the amount of saline solution noted in the device instructions. 5. Stand over a sink and tilt your head sideways over the sink. 6. Place the spout of the device in your upper nostril (the one closer to the ceiling). 7. Gently pour or squeeze the saline solution into your nasal cavity. The liquid should drain out from the lower nostril if you are not too congested. 8. While rinsing, breathe through your open mouth. 9. Gently blow your nose to clear any mucus and rinse solution. Blowing too hard may cause ear pain. 10. Repeat in your other nostril. 11. Clean and rinse your device with clean water and then air-dry it. Talk with your health care provider or pharmacist if you have questions about how to do a sinus rinse. Summary  A sinus rinse is a home treatment that is used to rinse your sinuses with a sterile mixture of salt and water (saline solution).  A sinus  rinse is generally safe and effective. Follow all instructions carefully.  Before doing a sinus rinse, talk with your health care provider about whether it would be helpful for you. This information is not intended to replace advice given to you by your health care provider. Make sure you discuss any questions you have with your health care provider. Document Revised: 12/21/2016 Document Reviewed: 12/21/2016 Elsevier Patient Education  2020 [place]. Nonallergic Rhinitis Nonallergic rhinitis is a condition that causes symptoms that affect the nose, such as a runny nose and a stuffed-up nose (nasal congestion) that can make it hard to breathe through the nose. This condition is different from having an allergy (allergic rhinitis). Allergic rhinitis occurs when the body's defense system (immune system) reacts to a substance that you are allergic to (allergen), such as pollen, pet dander, mold, or dust. Nonallergic rhinitis has many similar symptoms, but  it is not caused by allergens. Nonallergic rhinitis can be a short-term or long-term problem. What are the causes? This condition can be caused by many different things. Some common types of nonallergic rhinitis include: Infectious rhinitis  This is usually due to an infection in the upper respiratory tract. Vasomotor rhinitis  This is the most common type of long-term nonallergic rhinitis.  It is caused by too much blood flow through the nose, which makes the tissue inside of the nose swell.  Symptoms are often triggered by strong odors, cold air, stress, drinking alcohol, cigarette smoke, or changes in the weather. Occupational rhinitis  This type is caused by triggers in the workplace, such as chemicals, dusts, animal dander, or air pollution. Hormonal rhinitis  This type occurs in women as a result of an increase in the female hormone estrogen.  It may occur during pregnancy, puberty, and menstrual cycles.  Symptoms improve when estrogen levels drop. Drug-induced rhinitis Several drugs can cause nonallergic rhinitis, including:  Medicines that are used to treat high blood pressure, heart disease, and Parkinson disease.  Aspirin and NSAIDs.  Over-the-counter nasal decongestant sprays. These can cause a type of nonallergic rhinitis (rhinitis medicamentosa) when they are used for more than a few days. Nonallergic rhinitis with eosinophilia syndrome (NARES)  This type is caused by having too much of a certain type of white blood cell (eosinophil). Nonallergic rhinitis can also be caused by a reaction to eating hot or spicy foods. This does not usually cause long-term symptoms. In some cases, the cause of nonallergic rhinitis is not known. What increases the risk? You are more likely to develop this condition if:  You are 52-52 years of age.  You are a woman. Women are twice as likely to have this condition. What are the signs or symptoms? Common symptoms of this condition  include:  Nasal congestion.  Runny nose.  The feeling of mucus going down the back of the throat (postnasal drip).  Trouble sleeping at night and daytime sleepiness. Less common symptoms include:  Sneezing.  Coughing.  Itchy nose.  Bloodshot eyes. How is this diagnosed? This condition may be diagnosed based on:  Your symptoms and medical history.  A physical exam.  Allergy testing to rule out allergic rhinitis. You may have skin tests or blood tests. In some cases, the health care provider may take a swab of nasal secretions to look for an increased number of eosinophils. This would be done to confirm a diagnosis of NARES. How is this treated? Treatment for this condition depends on the cause. No single treatment works for everyone.  Work with your health care provider to find the best treatment for you. Treatment may include:  Avoiding the things that trigger your symptoms.  Using medicines to relieve congestion, such as: ? Steroid nasal spray. There are many types. You may need to try a few to find out which one works best. ? Decongestant medicine. This may be an oral medicine or a nasal spray. These medicines are only used for a short time.  Using medicines to relieve a runny nose. These may include antihistamine medicines or anticholinergic nasal sprays.  Surgery to remove tissue from inside the nose may be needed in severe cases if the condition has not improved after 6-12 months of medical treatment. Follow these instructions at home:  Take or use over-the-counter and prescription medicines only as told by your health care provider. Do not stop using your medicine even if you start to feel better.  Use salt-water (saline) rinses or other solutions (nasal washes or irrigations) to wash or rinse out the inside of your nose as told by your health care provider.  Do not take NSAIDs or medicines that contain aspirin if they make your symptoms worse.  Do not drink  alcohol if it makes your symptoms worse.  Do not use any tobacco products, such as cigarettes, chewing tobacco, and e-cigarettes. If you need help quitting, ask your health care provider.  Avoid secondhand smoke.  Get some exercise every day. Exercise may help reduce symptoms of nonallergic rhinitis for some people. Ask your health care provider how much exercise and what types of exercise are safe for you.  Sleep with the head of your bed raised (elevated). This may reduce nighttime nasal congestion.  Keep all follow-up visits as told by your health care provider. This is important. Contact a health care provider if:  You have a fever.  Your symptoms are getting worse at home.  Your symptoms are not responding to medicine.  You develop new symptoms, especially a headache or nosebleed. This information is not intended to replace advice given to you by your health care provider. Make sure you discuss any questions you have with your health care provider. Document Revised: 02/05/2017 Document Reviewed: 05/16/2015 Elsevier Patient Education  [place].

## 2019-10-31 NOTE — Progress Notes (Signed)
Subjective:    Patient ID: Sandra Lara, female    DOB: 1960-05-12, 59 y.o.   MRN: [id number]  59y/o Caucasian established female pt c/o rhinorrhea with heavy PND, frequent throat clearing, sore throat, slightly hoarse voice. Sx started Saturday, woke up worsened today and with the addition of HA, fatigue, and chills. Doesn't like taking phenylephrine as makes it feel like ants on her skin.  Pseudafed/pseudoephedrine she does not like side effects either.  Ran out of azelastine needs refill.  Albuterol inhaler would also like refill as has noticed some wheezing and her current inhaler almost empty.  Not her usual allergies.  Denied known covid contacts.  Has been feeling hot and cold today but no known fever. Last sinusitis 10/19/2019 amoxicillin course and all symptoms resolved and 09/19/2019 acute rhinosinusitis improved on prednisone oral but never completely cleared  Patient utilizing [employer] program to lose weight this year and has been actively losing weight.     Review of Systems  Constitutional: Positive for chills and fatigue. Negative for activity change, appetite change, diaphoresis and fever.  HENT: Positive for congestion, postnasal drip, rhinorrhea, sore throat and voice change. Negative for dental problem, drooling, ear discharge, ear pain, facial swelling, hearing loss, mouth sores, nosebleeds, tinnitus and trouble swallowing.   Eyes: Negative for photophobia, pain, discharge, redness, itching and visual disturbance.  Respiratory: Positive for wheezing. Negative for cough, choking, shortness of breath and stridor.   Cardiovascular: Negative for chest pain and palpitations.  Gastrointestinal: Negative for abdominal pain, diarrhea, nausea and vomiting.  Endocrine: Negative for cold intolerance and heat intolerance.  Genitourinary: Negative for dysuria.  Musculoskeletal: Negative for arthralgias, back pain, gait problem, joint swelling, myalgias, neck pain and neck stiffness.   Skin: Negative for rash.  Allergic/Immunologic: Positive for environmental allergies. Negative for food allergies.  Neurological: Positive for headaches. Negative for tremors, seizures, syncope, speech difficulty, weakness and numbness.  Hematological: Negative for adenopathy. Does not bruise/bleed easily.  Psychiatric/Behavioral: Negative for agitation, confusion and sleep disturbance.       Objective:   Physical Exam Vitals and nursing note reviewed.  Constitutional:      General: She is awake. She is not in acute distress.    Appearance: Normal appearance. She is well-developed and well-groomed. She is obese. She is ill-appearing. She is not toxic-appearing or diaphoretic.     Comments: Patient feeling under the weather but grooming/personal care normal  HENT:     Head: Normocephalic and atraumatic.     Jaw: There is normal jaw occlusion. No trismus.     Salivary Glands: Right salivary gland is not diffusely enlarged or tender. Left salivary gland is not diffusely enlarged or tender.     Right Ear: Hearing, ear canal and external ear normal. A middle ear effusion is present. There is no impacted cerumen.     Left Ear: Hearing, ear canal and external ear normal. A middle ear effusion is present. There is no impacted cerumen.     Nose: Mucosal edema, congestion and rhinorrhea present. No nasal deformity, septal deviation, signs of injury, laceration or nasal tenderness. Rhinorrhea is clear.     Right Turbinates: Enlarged and swollen. Not pale.     Left Turbinates: Enlarged and swollen. Not pale.     Right Sinus: No maxillary sinus tenderness or frontal sinus tenderness.     Left Sinus: No maxillary sinus tenderness or frontal sinus tenderness.     Mouth/Throat:     Lips: Pink. No lesions.  Mouth: Mucous membranes are moist. Mucous membranes are not pale, not dry and not cyanotic. No lacerations, oral lesions or angioedema.     Dentition: No dental caries, dental abscesses or gum  lesions.     Tongue: No lesions. Tongue does not deviate from midline.     Palate: No mass and lesions.     Pharynx: Uvula midline. Pharyngeal swelling and posterior oropharyngeal erythema present. No oropharyngeal exudate or uvula swelling.     Tonsils: No tonsillar exudate or tonsillar abscesses. 0 on the right. 0 on the left.     Comments: Cobblestoning posterior pharynx; bilateral TMs air fluid level clear; clear discharge bilateral nasal turbinates edema/erythema; nasal sniffing noted in exam room Eyes:     General: Lids are normal. Vision grossly intact. Gaze aligned appropriately. Allergic shiner present. No visual field deficit or scleral icterus.       Right eye: No foreign body, discharge or hordeolum.        Left eye: No foreign body, discharge or hordeolum.     Extraocular Movements: Extraocular movements intact.     Right eye: Normal extraocular motion and no nystagmus.     Left eye: Normal extraocular motion and no nystagmus.     Conjunctiva/sclera: Conjunctivae normal.     Right eye: Right conjunctiva is not injected. No chemosis, exudate or hemorrhage.    Left eye: Left conjunctiva is not injected. No chemosis, exudate or hemorrhage.    Pupils: Pupils are equal, round, and reactive to light. Pupils are equal.     Right eye: Pupil is round and reactive.     Left eye: Pupil is round and reactive.  Neck:     Thyroid: No thyroid tenderness.     Trachea: Trachea and phonation normal. No tracheal tenderness or tracheal deviation.  Cardiovascular:     Rate and Rhythm: Normal rate and regular rhythm.     Chest Wall: PMI is not displaced.     Pulses: Normal pulses.          Radial pulses are 2+ on the right side and 2+ on the left side.     Heart sounds: Normal heart sounds, S1 normal and S2 normal. Heart sounds not distant. No murmur heard.  No friction rub. No gallop.   Pulmonary:     Effort: Pulmonary effort is normal. No accessory muscle usage or respiratory distress.      Breath sounds: Normal breath sounds and air entry. No stridor, decreased air movement or transmitted upper airway sounds. No decreased breath sounds, wheezing, rhonchi or rales.     Comments: Wearing cloth reusable mask due to covid 19 pandemic; spoke full sentences without difficulty; no cough observed in exam room Chest:     Chest wall: No tenderness.  Abdominal:     General: There is no distension.     Palpations: Abdomen is soft.  Musculoskeletal:        General: No swelling, tenderness, deformity or signs of injury. Normal range of motion.     Right shoulder: Normal.     Left shoulder: Normal.     Right elbow: Normal.     Left elbow: Normal.     Right hand: Normal.     Left hand: Normal.     Cervical back: Normal, normal range of motion and neck supple. No swelling, edema, deformity, erythema, signs of trauma, lacerations, rigidity, torticollis or crepitus. No pain with movement, spinous process tenderness or muscular tenderness. Normal range of motion.  Thoracic back: Normal.     Lumbar back: Normal.     Right hip: Normal.     Left hip: Normal.     Right knee: Normal.     Left knee: Normal.  Lymphadenopathy:     Head:     Right side of head: No submental, submandibular, tonsillar, preauricular, posterior auricular or occipital adenopathy.     Left side of head: No submental, submandibular, tonsillar, preauricular, posterior auricular or occipital adenopathy.     Cervical: No cervical adenopathy.     Right cervical: No superficial, deep or posterior cervical adenopathy.    Left cervical: No superficial, deep or posterior cervical adenopathy.  Skin:    General: Skin is warm and dry.     Capillary Refill: Capillary refill takes less than 2 seconds.     Coloration: Skin is not ashen, cyanotic, jaundiced, mottled, pale or sallow.     Findings: No abrasion, abscess, acne, bruising, burn, ecchymosis, erythema, laceration, lesion, petechiae, rash or wound.     Nails: There is no  clubbing.  Neurological:     General: No focal deficit present.     Mental Status: She is alert and oriented to person, place, and time. Mental status is at baseline. She is not disoriented.     GCS: GCS eye subscore is 4. GCS verbal subscore is 5. GCS motor subscore is 6.     Cranial Nerves: Cranial nerves are intact. No cranial nerve deficit, dysarthria or facial asymmetry.     Sensory: Sensation is intact. No sensory deficit.     Motor: Motor function is intact. No weakness, tremor, atrophy, abnormal muscle tone or seizure activity.     Coordination: Coordination is intact. Coordination normal.     Gait: Gait is intact. Gait normal.     Comments: Gait sure and steady in clinic; in/out of chair without difficulty; bilateral hand grasp 5/5  Psychiatric:        Attention and Perception: Attention and perception normal.        Mood and Affect: Mood and affect normal.        Speech: Speech normal.        Behavior: Behavior normal. Behavior is cooperative.        Thought Content: Thought content normal.        Cognition and Memory: Cognition and memory normal.        Judgment: Judgment normal.           Assessment & Plan:  A-acute rhinitis, acute bronchitis  P-probable viral infection but ran out of nasal antihistamine so could be confounding.  Covid testing recommended due to hot/cold and not her usual post nasal drip/sinusitis.  Patient to start quarantine today for covid symptoms.  RN Hildred Alamin notified supervisor clinic staff recommended she go home today.  Covid testing scheduled for 11/01/2019 and results typically taking 2-3 days due to delta surge in testing/infections.  Since nasal discharge clear/no fever will start with restarting azelastine 168mcg/spray 1 spray each nostril BID #30 RF11; continuing flonase 1 spray each nostril BID; increasing nasal saline frequency and showering prior to bedtime.  Patient refused phenylephrine and pseudoephedrine does not like side effects.  Wear  mask to decrease irritant exposure at work/when out in community.  Increase use of nasal saline.  Patient may use normal saline nasal spray 2 sprays each nostril q2h wa as needed.  Patient denied personal or family history of ENT cancer.  OTC antihistamine of choice claritin 10mg  po daily.  Tylenol 1000mg  po qid prn pain.    Avoid triggers if possible.  Shower prior to bedtime if exposed to triggers.  If allergic dust/dust mites recommend mattress/pillow covers/encasements; washing linens, vacuuming, sweeping, dusting weekly.  Call or return to clinic as needed if these symptoms worsen or fail to improve as anticipated. Will follow up with patient in 48 hours if no improvement consider prednisone burst or taper again.  Patient reported coworker Corbin Ade lives nearby and could drop off medication Thursday if prescribed anything on formulary from [facility] later this week.  Exitcare handout on sinusitis and sinus rinse given to patient.  Patient verbalized understanding of instructions, agreed with plan of care and had no further questions at this time.  P2:  Avoidance and hand washing.   OTC cough drops/cough medicine per manufacturer instructions.  Need to dry up drip restart azelastine nasal spray continue flonase.  Increase nasal saline use.  Post nasal drip irritation viral most likely.  Recent rounds of prednisone and amoxicillin in past 2 months. Refilled electronic Rx  Albuterol MDI 24mcg 1-2 puffs po q4-6h prn protracted cough/wheeze #1 RF1 side effect increased heart rate. Bronchitis simple, community acquired, may have started as viral (probably respiratory syncytial, parainfluenza, influenza, or adenovirus)with resultant bronchial edema and mucus formation.  Viruses are the most common cause of bronchial inflammation in otherwise healthy adults with acute bronchitis.  The appearance of sputum is not predictive of whether a bacterial infection is present.  Purulent sputum is most often caused by viral  infections.  There are a small portion of those caused by non-viral agents being Mycoplama pneumonia.  Microscopic examination or C&S of sputum in the healthy adult with acute bronchitis is generally not helpful (usually negative or normal respiratory flora) other considerations being cough from upper respiratory tract infections, sinusitis or allergic syndromes (mild asthma or viral pneumonia).  Differential Diagnoses:  reactive airway disease (asthma, allergic aspergillosis (eosinophilia), chronic bronchitis, respiratory infection (sinusitis, common cold, pneumonia), congestive heart failure, reflux esophagitis, bronchogenic tumor, aspiration syndromes and/or exposure to pulmonary irritants/smoke.  I will order Without high fever, severe dyspnea, lack of physical findings or other risk factors, I will hold on a chest radiograph and CBC at this time.  I discussed that approximately 50% of patients with acute bronchitis have a cough that lasts up to three weeks, and 25% for over a month.  Tylenol 500mg  one to two tablets every four to six hours as needed for fever or myalgias.  No aspirin. Exitcare handout on bronchitis and inhaler use printed and given to patient.  ER if hemopthysis, SOB, worst chest pain of life.   Patient instructed to follow up if symptoms do not improve with plan of care x48 hours or sooner if symptoms worsen.  Discussed clinic closed tomorrow but reopen Th/Fr 8/26 & 8/27  Patient verbalized agreement and understanding of treatment plan and had no further questions at this time.  P2:  hand washing and cover cough

## 2019-11-01 ENCOUNTER — Other Ambulatory Visit: Payer: Self-pay | Admitting: Critical Care Medicine

## 2019-11-01 ENCOUNTER — Other Ambulatory Visit: Payer: PRIVATE HEALTH INSURANCE

## 2019-11-01 DIAGNOSIS — Z20822 Contact with and (suspected) exposure to covid-19: Secondary | ICD-10-CM

## 2019-11-03 ENCOUNTER — Encounter: Payer: Self-pay | Admitting: Family Medicine

## 2019-11-03 LAB — NOVEL CORONAVIRUS, NAA: SARS-CoV-2, NAA: NOT DETECTED

## 2019-11-03 LAB — SARS-COV-2, NAA 2 DAY TAT

## 2019-11-04 ENCOUNTER — Telehealth: Payer: Self-pay | Admitting: Registered Nurse

## 2019-11-04 NOTE — Telephone Encounter (Signed)
Telephone message left for patient covid test negative reviewed in epic.  I was calling to see if her symptoms improved with restarting her nose spray or if still having sinus/ear issues.  Notified patient she could send me message via mychart or [email or web address].  Noted patient reported her current weight 179lbs on message to [facility] and when she started Noom was 208lbs Nov 2020.

## 2019-11-05 NOTE — Telephone Encounter (Signed)
Patient sent me email feeling better with nose sprays and did not have concerns at this time.

## 2020-01-16 ENCOUNTER — Other Ambulatory Visit: Payer: Self-pay

## 2020-01-16 ENCOUNTER — Encounter: Payer: Self-pay | Admitting: Registered Nurse

## 2020-01-16 ENCOUNTER — Ambulatory Visit: Payer: Self-pay | Admitting: Registered Nurse

## 2020-01-16 VITALS — BP 130/82 | HR 82 | Temp 98.8°F | Resp 16 | Ht <= 58 in | Wt 170.5 lb

## 2020-01-16 DIAGNOSIS — B353 Tinea pedis: Secondary | ICD-10-CM

## 2020-01-16 DIAGNOSIS — L259 Unspecified contact dermatitis, unspecified cause: Secondary | ICD-10-CM

## 2020-01-16 MED ORDER — BETAMETHASONE DIPROPIONATE AUG 0.05 % EX CREA: TOPICAL_CREAM | Freq: Two times a day (BID) | CUTANEOUS | 0 refills | Status: AC

## 2020-01-16 MED ORDER — MICONAZOLE NITRATE 2 % EX POWD: CUTANEOUS | 0 refills | Status: AC | PRN

## 2020-01-16 NOTE — Patient Instructions (Signed)
Atopic Dermatitis Atopic dermatitis is a skin disorder that causes inflammation of the skin. This is the most common type of eczema. Eczema is a group of skin conditions that cause the skin to be itchy, red, and swollen. This condition is generally worse during the cooler winter months and often improves during the warm summer months. Symptoms can vary from person to person. Atopic dermatitis usually starts showing signs in infancy and can last through adulthood. This condition cannot be passed from one person to another (non-contagious), but it is more common in families. Atopic dermatitis may not always be present. When it is present, it is called a flare-up. What are the causes? The exact cause of this condition is not known. Flare-ups of the condition may be triggered by:  Contact with something that you are sensitive or allergic to.  Stress.  Certain foods.  Extremely hot or cold weather.  Harsh chemicals and soaps.  Dry air.  Chlorine. What increases the risk? This condition is more likely to develop in people who have a personal history or family history of eczema, allergies, asthma, or hay fever. What are the signs or symptoms? Symptoms of this condition include:  Dry, scaly skin.  Red, itchy rash.  Itchiness, which can be severe. This may occur before the skin rash. This can make sleeping difficult.  Skin thickening and cracking that can occur over time. How is this diagnosed? This condition is diagnosed based on your symptoms, a medical history, and a physical exam. How is this treated? There is no cure for this condition, but symptoms can usually be controlled. Treatment focuses on:  Controlling the itchiness and scratching. You may be given medicines, such as antihistamines or steroid creams.  Limiting exposure to things that you are sensitive or allergic to (allergens).  Recognizing situations that cause stress and developing a plan to manage stress. If your  atopic dermatitis does not get better with medicines, or if it is all over your body (widespread), a treatment using a specific type of light (phototherapy) may be used. Follow these instructions at home: Skin care   Keep your skin well-moisturized. Doing this seals in moisture and helps to prevent dryness. ? Use unscented lotions that have petroleum in them. ? Avoid lotions that contain alcohol or water. They can dry the skin.  Keep baths or showers short (less than 5 minutes) in warm water. Do not use hot water. ? Use mild, unscented cleansers for bathing. Avoid soap and bubble bath. ? Apply a moisturizer to your skin right after a bath or shower.  Do not apply anything to your skin without checking with your health care provider. General instructions  Dress in clothes made of cotton or cotton blends. Dress lightly because heat increases itchiness.  When washing your clothes, rinse your clothes twice so all of the soap is removed.  Avoid any triggers that can cause a flare-up.  Try to manage your stress.  Keep your fingernails cut short.  Avoid scratching. Scratching makes the rash and itchiness worse. It may also result in a skin infection (impetigo) due to a break in the skin caused by scratching.  Take or apply over-the-counter and prescription medicines only as told by your health care provider.  Keep all follow-up visits as told by your health care provider. This is important.  Do not be around people who have cold sores or fever blisters. If you get the infection, it may cause your atopic dermatitis to worsen. Contact a health   care provider if:  Your itchiness interferes with sleep.  Your rash gets worse or it is not better within one week of starting treatment.  You have a fever.  You have a rash flare-up after having contact with someone who has cold sores or fever blisters. Get help right away if:  You develop pus or soft yellow scabs in the rash  area. Summary  This condition causes a red rash and itchy, dry, scaly skin.  Treatment focuses on controlling the itchiness and scratching, limiting exposure to things that you are sensitive or allergic to (allergens), recognizing situations that cause stress, and developing a plan to manage stress.  Keep your skin well-moisturized.  Keep baths or showers shorter than 5 minutes and use warm water. Do not use hot water. This information is not intended to replace advice given to you by your health care provider. Make sure you discuss any questions you have with your health care provider. Document Revised: 06/14/2018 Document Reviewed: 03/27/2016 Elsevier Patient Education  [place]  Athlete's foot (tinea pedis) is a fungal infection of the skin on your feet. It often occurs on the skin that is between or underneath the toes. It can also occur on the soles of your feet. The infection can spread from person to person (is contagious). It can also spread when a person's bare feet come in contact with the fungus on shower floors or on items such as shoes. What are the causes? This condition is caused by a fungus that grows in warm, moist places. You can get athlete's foot by sharing shoes, shower stalls, towels, and wet floors with someone who is infected. Not washing your feet or changing your socks often enough can also lead to athlete's foot. What increases the risk? This condition is more likely to develop in:  Men.  People who have a weak body defense system (immune system).  People who have diabetes.  People who use public showers, such as at a gym.  People who wear heavy-duty shoes, such as Environmental manager.  Seasons with warm, humid weather. What are the signs or symptoms? Symptoms of this condition include:  Itchy areas between your toes or on the soles of your feet.  White, flaky, or scaly areas between your toes or on the soles of your  feet.  Very itchy small blisters between your toes or on the soles of your feet.  Small cuts in your skin. These cuts can become infected.  Thick or discolored toenails. How is this diagnosed? This condition may be diagnosed with a physical exam and a review of your medical history. Your health care provider may also take a skin or toenail sample to examine under a microscope. How is this treated? This condition is treated with antifungal medicines. These may be applied as powders, ointments, or creams. In severe cases, an oral antifungal medicine may be given. Follow these instructions at home: Medicines  Apply or take over-the-counter and prescription medicines only as told by your health care provider.  Apply your antifungal medicine as told by your health care provider. Do not stop using the antifungal even if your condition improves. Foot care  Do not scratch your feet.  Keep your feet dry: ? Wear cotton or wool socks. Change your socks every day or if they become wet. ? Wear shoes that allow air to flow, such as sandals or canvas tennis shoes.  Wash and dry your feet, including the area between  your toes. Also, wash and dry your feet: ? Every day or as told by your health care provider. ? After exercising. General instructions  Do not let others use towels, shoes, nail clippers, or other personal items that touch your feet.  Protect your feet by wearing sandals in wet areas, such as locker rooms and shared showers.  Keep all follow-up visits as told by your health care provider. This is important.  If you have diabetes, keep your blood sugar under control. Contact a health care provider if:  You have a fever.  You have swelling, soreness, warmth, or redness in your foot.  Your feet are not getting better with treatment.  Your symptoms get worse.  You have new symptoms. Summary  Athlete's foot (tinea pedis) is a fungal infection of the skin on your feet. It  often occurs on skin that is between or underneath the toes.  This condition is caused by a fungus that grows in warm, moist places.  Symptoms include white, flaky, or scaly areas between your toes or on the soles of your feet.  This condition is treated with antifungal medicines.  Keep your feet clean. Always dry them thoroughly. This information is not intended to replace advice given to you by your health care provider. Make sure you discuss any questions you have with your health care provider. Document Revised: 02/18/2017 Document Reviewed: 12/14/2016 Elsevier Patient Education  [place].

## 2020-01-16 NOTE — Progress Notes (Signed)
Subjective:    Patient ID: Sandra Lara, female    DOB: Feb 08, 1961, 59 y.o.   MRN: [id number]  59y/o Caucasian established female pt c/o rash to bilateral hands, feet (heels and between toes), L forearm x2 months. Is flaky, dry and itchy. She has tried Aquaphor, O'keefes hand cream, Neosporin, and spouse's Betamethasone cream. Reports only Betamethasone has made any improvement, using inconsistently in the past 2 weeks. [place] thought her feet rash would improve with salt water and sand "pumice" but they are still just itchy and rash enlarging.  Tried a regular pumice stone in the previous two weeks and didn't help on feet.  Greatest affected area left foot and bilateral palms of hands adjacent to wrists.  Denied history of this rash (new).  Denied history of eczema, athlete's foot.  Reports she dries between each toe after showering with towel.  Rotates shoes for work doesn't wear same pair of sneakers each day.  Still following [employer] weight loss program and working well for her.  Knee and hip not hurting her since she has met her weight loss goal for the year and continuing to lose weight through dietary choices that are healthy.  Last [name] labs prior to covid pandemic.     Review of Systems  Constitutional: Negative for appetite change, chills, diaphoresis, fatigue and fever.  HENT: Negative for trouble swallowing and voice change.   Eyes: Negative for photophobia and visual disturbance.  Respiratory: Negative for cough, shortness of breath, wheezing and stridor.   Cardiovascular: Negative for leg swelling.  Gastrointestinal: Negative for diarrhea, nausea and vomiting.  Endocrine: Negative for cold intolerance and heat intolerance.  Genitourinary: Negative for difficulty urinating.  Musculoskeletal: Negative for arthralgias, back pain, gait problem, joint swelling, myalgias, neck pain and neck stiffness.  Skin: Positive for rash. Negative for pallor  and wound.  Allergic/Immunologic: Positive for environmental allergies. Negative for food allergies and immunocompromised state.  Neurological: Negative for dizziness, tremors, seizures, syncope, facial asymmetry, speech difficulty, weakness, light-headedness, numbness and headaches.  Hematological: Negative for adenopathy. Does not bruise/bleed easily.  Psychiatric/Behavioral: Negative for agitation, confusion and sleep disturbance.       Objective:   Physical Exam Vitals and nursing note reviewed.  Constitutional:      General: She is awake. She is not in acute distress.    Appearance: Normal appearance. She is well-developed and well-groomed. She is obese. She is not ill-appearing, toxic-appearing or diaphoretic.  HENT:     Head: Normocephalic and atraumatic.     Jaw: There is normal jaw occlusion.     Salivary Glands: Right salivary gland is not diffusely enlarged. Left salivary gland is not diffusely enlarged.     Right Ear: Hearing and external ear normal.     Left Ear: Hearing and external ear normal.     Nose: Nose normal. No congestion or rhinorrhea.     Mouth/Throat:     Pharynx: Oropharynx is clear.  Eyes:     General: Lids are normal. Vision grossly intact. Gaze aligned appropriately. Allergic shiner present. No visual field deficit or scleral icterus.       Right eye: No discharge.        Left eye: No discharge.     Extraocular Movements: Extraocular movements intact.     Conjunctiva/sclera: Conjunctivae normal.     Pupils: Pupils are equal, round, and reactive to light.  Neck:     Trachea: Trachea and phonation normal.  No tracheostomy, abnormal tracheal secretions or tracheal deviation.  Cardiovascular:     Rate and Rhythm: Normal rate and regular rhythm.     Pulses: Normal pulses.          Dorsalis pedis pulses are 2+ on the right side and 2+ on the left side.  Pulmonary:     Effort: Pulmonary effort is normal. No respiratory distress.     Breath sounds: Normal  breath sounds and air entry. No stridor or transmitted upper airway sounds. No wheezing.     Comments: Wearing cloth mask due to covid pandemic; spoke full sentences without difficulty; no cough observed in exam room Abdominal:     General: Abdomen is flat.     Palpations: Abdomen is soft.  Musculoskeletal:        General: No swelling, deformity or signs of injury. Normal range of motion.     Right shoulder: Normal. No swelling, deformity, effusion or laceration.     Left shoulder: Normal. No swelling, deformity, effusion or laceration.     Right elbow: Normal. No swelling, deformity, effusion or lacerations.     Left elbow: Normal. No swelling, deformity, effusion or lacerations.     Right wrist: Normal. No swelling, deformity, effusion or lacerations. Normal range of motion.     Left wrist: Normal. No swelling, deformity, effusion or lacerations. Normal range of motion.     Right hand: No swelling, deformity, lacerations, tenderness or bony tenderness. Normal range of motion. Normal strength. Normal sensation. Normal capillary refill. Normal pulse.     Left hand: No swelling, deformity, lacerations, tenderness or bony tenderness. Normal range of motion. Normal strength. Normal sensation. Normal capillary refill. Normal pulse.     Cervical back: Normal range of motion and neck supple. No edema, erythema, signs of trauma, rigidity, torticollis or crepitus. No pain with movement. Normal range of motion.     Right lower leg: No edema.     Left lower leg: No edema.     Right foot: Normal range of motion. No deformity, bunion, Charcot foot, foot drop or prominent metatarsal heads.     Left foot: Normal range of motion. No deformity, bunion, Charcot foot, foot drop or prominent metatarsal heads.       Feet:  Feet:     Right foot:     Skin integrity: Skin breakdown, warmth, callus and dry skin present. No ulcer, blister or erythema.     Toenail Condition: Right toenails are normal.     Left foot:      Skin integrity: Skin breakdown, warmth, callus and dry skin present. No ulcer, blister or erythema.     Toenail Condition: Left toenails are normal.     Comments: Scaling gross bilateral heels nummular pattern raised perimeter borders with central clearing; peeling callous bilateral heels; between 3 toes left a few papules with macular erythema small area maceration and scaling not TTP but patient reported itching;  Lymphadenopathy:     Cervical:     Right cervical: No superficial cervical adenopathy.    Left cervical: No superficial cervical adenopathy.  Skin:    General: Skin is warm and dry.     Capillary Refill: Capillary refill takes less than 2 seconds.     Coloration: Skin is not ashen, cyanotic, jaundiced, mottled, pale or sallow.     Findings: Abrasion and rash present. No abscess, acne, bruising, burn, ecchymosis, erythema, signs of injury, laceration, lesion, petechiae or wound. Rash is macular, papular and scaling. Rash is  not crusting, nodular, purpuric, pustular, urticarial or vesicular.     Nails: There is no clubbing.          Comments: See foot exam nummular scaling heels with papules overlying calloused heels; callous overlying MTP joints bilateral feet; bilateral palmar surface hands 2cm x 4 cm epithelial layer peeling in nummular pattern dry no discharge thenal/ulnar prominance; no discharge or pain noted any rash; umbilicus small area scale/erythema dry less than 86m  Neurological:     General: No focal deficit present.     Mental Status: She is alert and oriented to person, place, and time. Mental status is at baseline.     GCS: GCS eye subscore is 4. GCS verbal subscore is 5. GCS motor subscore is 6.     Cranial Nerves: Cranial nerves are intact. No cranial nerve deficit, dysarthria or facial asymmetry.     Sensory: Sensation is intact. No sensory deficit.     Motor: Motor function is intact. No weakness, tremor, atrophy, abnormal muscle tone or seizure activity.      Coordination: Coordination is intact. Coordination normal.     Gait: Gait is intact. Gait normal.     Comments: Gait sure and steady in clinic; in/out of chair and on/off exam table without difficulty; bilateral hand grasp equal 5/5  Psychiatric:        Attention and Perception: Attention and perception normal.        Mood and Affect: Mood and affect normal.        Speech: Speech normal.        Behavior: Behavior normal. Behavior is cooperative.        Thought Content: Thought content normal.        Cognition and Memory: Cognition and memory normal.        Judgment: Judgment normal.           Assessment & Plan:  A-contact dermatitis and eczema and tinea pedis bilateral feet initial encounter  P-Tolerating allegra D po daily without drowsiness or other side effects may continue or switch to zyrtec 133mpo BID prn itching.  calagel has not been helpful.  Electronic Rx betamethasone diproprionate 0.5% cream apply BID x 2 weeks bilateral hands/feet affected area and cover with emollient e.g. aquaphor/vasline/eucerin ointment or cream not pump lotion #[id number] [id number] to her pharmacy of choice  Wash hands before and after application.  Avoid hot steam showers.  Apply emollient twice a day e.g. Fragrance free vaseline.  May apply ice/cold compress 5 minutes QID prn itching/swelling.   Avoid rubbing face at work.  Discussed patient processing covid sanitizing efforts may be exposing her to substance on desk that is causing irritation/allergic reaction as only affecting portion of hand that has the most contact with keyboard/desk.  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  Tepid shower not steam shower as can worsen rash/rash symptoms/dry out skin further/strip of natural oils.   Shower when she finishes work/prior to bedtime.  Avoid harsh/abrasive soaps use fragrance free/sensitive like dove/cetaphil.    Medication as directed. Call or return to  clinic as needed if these symptoms worsen or fail to improve as anticipated. Exitcare handouts on contact dermatitis and eczema printed and given to patient.  Follow up for re-evaluation in 48 hours if no improvement and/or worsening of rash with plan of care.  Overdue annual exec panel and due to history prediabetes will check Hgba1c also.  Discussed with patient  due to weight loss I expect her glucose to be improved.  Fasting to be scheduled with RN Hildred Alamin in the next 30 days. Patient verbalized agreement and understanding of treatment plan and had no further questions at this time  Interdigital fine maceration and scale between some toes  Medication as directed. Miconazole 2% powder apply to shoes/feet daily OTC.   Call or return to clinic as needed if these symptoms worsen or fail to improve as anticipated. Exitcare handout on tinea pedis printed and given to patient. Discussed hygiene with patient e.g. do not stay in sweat soaked socks/change if wet mid shift/after work and consider shower with selsun blue shampoo prn (apply and leave on skin for 10 minutes to affected areas then rinse off).  Reoccurrence of condition common and may require retreatment. Sometimes using blowdryer to dry skin thoroughly also helpful. Rotate shoes and consider placing shoes outside in sun to dry thoroughly on sunny days.  Patient verbalized agreement and understanding of treatment plan and had no further questions at this time. P2: Avoidance and hand washing.

## 2020-01-16 NOTE — Progress Notes (Signed)
Right hand rash started after wearing [employer] silicon band 2 months ago Left hand rash started mid October  Left foot 6 weeks duration rash. Right foot rash started recently

## 2020-03-04 ENCOUNTER — Other Ambulatory Visit: Payer: Self-pay | Admitting: Family Medicine

## 2020-03-21 ENCOUNTER — Other Ambulatory Visit: Payer: Self-pay

## 2020-03-21 ENCOUNTER — Ambulatory Visit: Payer: Self-pay | Admitting: *Deleted

## 2020-03-21 DIAGNOSIS — Z0000 Encounter for general adult medical examination without abnormal findings: Secondary | ICD-10-CM

## 2020-03-21 NOTE — Progress Notes (Signed)
Fasting labs ahead of CPE with pcp.

## 2020-03-22 LAB — CMP12+LP+TP+TSH+6AC+CBC/D/PLT
ALT: 14 IU/L (ref 0–32)
AST: 15 IU/L (ref 0–40)
Albumin/Globulin Ratio: 2.6 — ABNORMAL HIGH (ref 1.2–2.2)
Albumin: 4.1 g/dL (ref 3.8–4.9)
Alkaline Phosphatase: 76 IU/L (ref 44–121)
BUN/Creatinine Ratio: 18 (ref 9–23)
BUN: 12 mg/dL (ref 6–24)
Basophils Absolute: 0.1 10*3/uL (ref 0.0–0.2)
Basos: 1 %
Bilirubin Total: 0.3 mg/dL (ref 0.0–1.2)
Calcium: 9.3 mg/dL (ref 8.7–10.2)
Chloride: 102 mmol/L (ref 96–106)
Chol/HDL Ratio: 3.7 ratio (ref 0.0–4.4)
Cholesterol, Total: 206 mg/dL — ABNORMAL HIGH (ref 100–199)
Creatinine, Ser: 0.65 mg/dL (ref 0.57–1.00)
EOS (ABSOLUTE): 0.1 10*3/uL (ref 0.0–0.4)
Eos: 3 %
Estimated CHD Risk: 0.7 times avg. (ref 0.0–1.0)
Free Thyroxine Index: 1.5 (ref 1.2–4.9)
GFR calc Af Amer: 112 mL/min/{1.73_m2} (ref >59)
GFR calc non Af Amer: 97 mL/min/{1.73_m2} (ref >59)
GGT: 15 IU/L (ref 0–60)
Globulin, Total: 1.6 g/dL (ref 1.5–4.5)
Glucose: 98 mg/dL (ref 65–99)
HDL: 55 mg/dL (ref >39)
Hematocrit: 43.4 % (ref 34.0–46.6)
Hemoglobin: 14.2 g/dL (ref 11.1–15.9)
Immature Grans (Abs): 0 10*3/uL (ref 0.0–0.1)
Immature Granulocytes: 0 %
Iron: 75 ug/dL (ref 27–159)
LDH: 152 IU/L (ref 119–226)
LDL Chol Calc (NIH): 136 mg/dL — ABNORMAL HIGH (ref 0–99)
Lymphocytes Absolute: 1.5 10*3/uL (ref 0.7–3.1)
Lymphs: 28 %
MCH: 30.1 pg (ref 26.6–33.0)
MCHC: 32.7 g/dL (ref 31.5–35.7)
MCV: 92 fL (ref 79–97)
Monocytes Absolute: 0.5 10*3/uL (ref 0.1–0.9)
Monocytes: 10 %
Neutrophils Absolute: 3.1 10*3/uL (ref 1.4–7.0)
Neutrophils: 58 %
Phosphorus: 3.9 mg/dL (ref 3.0–4.3)
Platelets: 260 10*3/uL (ref 150–450)
Potassium: 4.9 mmol/L (ref 3.5–5.2)
RBC: 4.71 x10E6/uL (ref 3.77–5.28)
RDW: 13.1 % (ref 11.7–15.4)
Sodium: 139 mmol/L (ref 134–144)
T3 Uptake Ratio: 24 % (ref 24–39)
T4, Total: 6.2 ug/dL (ref 4.5–12.0)
TSH: 0.812 u[IU]/mL (ref 0.450–4.500)
Total Protein: 5.7 g/dL — ABNORMAL LOW (ref 6.0–8.5)
Triglycerides: 82 mg/dL (ref 0–149)
Uric Acid: 4.4 mg/dL (ref 3.0–7.2)
VLDL Cholesterol Cal: 15 mg/dL (ref 5–40)
WBC: 5.3 10*3/uL (ref 3.4–10.8)

## 2020-03-22 LAB — HGB A1C W/O EAG: Hgb A1c MFr Bld: 5.9 % — ABNORMAL HIGH (ref 4.8–5.6)

## 2020-03-22 NOTE — Progress Notes (Signed)
Noted pt has viewed results in [facility]. Called and LVM for pt that hard copy of labs available in clinic and to return call if any questions/concerns regarding results and to f/u with pcp as planned.

## 2020-03-22 NOTE — Progress Notes (Signed)
Noted and my chart message sent to patient comparing previous to current lab results

## 2020-05-28 ENCOUNTER — Ambulatory Visit: Payer: Self-pay | Admitting: Registered Nurse

## 2020-05-28 ENCOUNTER — Other Ambulatory Visit: Payer: Self-pay

## 2020-05-28 VITALS — BP 129/85 | HR 68 | Temp 98.1°F | Resp 16 | Ht <= 58 in | Wt 169.0 lb

## 2020-05-28 DIAGNOSIS — L259 Unspecified contact dermatitis, unspecified cause: Secondary | ICD-10-CM

## 2020-05-28 DIAGNOSIS — Z6835 Body mass index (BMI) 35.0-35.9, adult: Secondary | ICD-10-CM

## 2020-05-28 DIAGNOSIS — L209 Atopic dermatitis, unspecified: Secondary | ICD-10-CM

## 2020-05-28 MED ORDER — TACROLIMUS 0.1 % EX OINT: TOPICAL_OINTMENT | Freq: Two times a day (BID) | CUTANEOUS | 0 refills | Status: DC

## 2020-05-28 NOTE — Patient Instructions (Addendum)
Use tacrolimus for 6 weeks or less topical twice a day to affected areas only Apply emollient twice a day especially after bathing  Atopic Dermatitis Atopic dermatitis is a skin disorder that causes inflammation of the skin. It is marked by a red rash and itchy, dry, scaly skin. It is the most common type of eczema. Eczema is a group of skin conditions that cause the skin to become rough and swollen. This condition is generally worse during the cooler winter months and often improves during the warm summer months. Atopic dermatitis usually starts showing signs in infancy and can last through adulthood. This condition cannot be passed from one person to another (is not contagious). Atopic dermatitis may not always be present, but when it is, it is called a flare-up. What are the causes? The exact cause of this condition is not known. Flare-ups may be triggered by:  Coming in contact with something that you are sensitive or allergic to (allergen).  Stress.  Certain foods.  Extremely hot or cold weather.  Harsh chemicals and soaps.  Dry air.  Chlorine. What increases the risk? This condition is more likely to develop in people who have a personal or family history of:  Eczema.  Allergies.  Asthma.  Hay fever. What are the signs or symptoms? Symptoms of this condition include:  Dry, scaly skin.  Red, itchy rash.  Itchiness, which can be severe. This may occur before the skin rash. This can make sleeping difficult.  Skin thickening and cracking that can occur over time.   How is this diagnosed? This condition is diagnosed based on:  Your symptoms.  Your medical history.  A physical exam. How is this treated? There is no cure for this condition, but symptoms can usually be controlled. Treatment focuses on:  Controlling the itchiness and scratching. You may be given medicines, such as antihistamines or steroid creams.  Limiting exposure to allergens.  Recognizing  situations that cause stress and developing a plan to manage stress. If your atopic dermatitis does not get better with medicines, or if it is all over your body (widespread), a treatment using a specific type of light (phototherapy) may be used. Follow these instructions at home: Skin care  Keep your skin well moisturized. Doing this seals in moisture and helps to prevent dryness. ? Use unscented lotions that have petroleum in them. ? Avoid lotions that contain alcohol or water. They can dry the skin.  Keep baths or showers short (less than 5 minutes) in warm water. Do not use hot water. ? Use mild, unscented cleansers for bathing. Avoid soap and bubble bath. ? Apply a moisturizer to your skin right after a bath or shower.  Do not apply anything to your skin without checking with your health care provider.   General instructions  Take or apply over-the-counter and prescription medicines only as told by your health care provider.  Dress in clothes made of cotton or cotton blends. Dress lightly because heat increases itchiness.  When washing your clothes, rinse your clothes twice so all of the soap is removed.  Avoid any triggers that can cause a flare-up.  Keep your fingernails cut short.  Avoid scratching. Scratching makes the rash and itchiness worse. A break in the skin from scratching could result in a skin infection (impetigo).  Do not be around people who have cold sores or fever blisters. If you get the infection, it may cause your atopic dermatitis to worsen.  Keep all follow-up visits. This  is important. Contact a health care provider if:  Your itchiness interferes with sleep.  Your rash gets worse or is not better within one week of starting treatment.  You have a fever.  You have a rash flare-up after having contact with someone who has cold sores or fever blisters. Get help right away if:  You develop pus or soft yellow scabs in the rash area. Summary  Atopic  dermatitis causes a red rash and itchy, dry, scaly skin.  Treatment focuses on controlling the itchiness and scratching, limiting exposure to things that you are sensitive or allergic to (allergens), recognizing situations that cause stress, and developing a plan to manage stress.  Keep your skin well moisturized.  Keep baths or showers shorter than 5 minutes and use warm water. Do not use hot water. This information is not intended to replace advice given to you by your health care provider. Make sure you discuss any questions you have with your health care provider. Document Revised: 12/04/2019 Document Reviewed: 12/04/2019 Elsevier Patient Education  2021 [employer].

## 2020-05-28 NOTE — Progress Notes (Signed)
Subjective:    Patient ID: Sandra Lara, female    DOB: Oct 13, 1960, 60 y.o.   MRN: [id number]  59y/o Caucasian established female pt presenting for eczema f/u. Continuing to have flares. Worse on R hand and R foot. Feet have improved some with betamethasone topical and cetaphil daily used two 50g tubes since last visit 16 Jan 2020.  Heels have cleared but still having some midfoot excoriated areas and fingertips/lateral hand scaling and peeling of skin.  Denied discharge/chills/fever/blisters/swelling.  Some itching. Recently switched from allegra D and flonase to nasacort and claritin for seasonal allergies.  Has maintained weight loss over the winter following noom program and planning to kick up exercise now that weather has improved and spring here to restart weight loss.       Review of Systems  Constitutional: Negative for activity change, appetite change, chills, diaphoresis, fatigue and fever.  HENT: Negative for congestion, dental problem, drooling, ear discharge, ear pain, facial swelling, hearing loss, mouth sores, nosebleeds, postnasal drip, rhinorrhea, sinus pressure, sinus pain, sneezing, sore throat, tinnitus, trouble swallowing and voice change.   Eyes: Negative for photophobia and visual disturbance.  Respiratory: Negative for cough, shortness of breath and stridor.   Cardiovascular: Negative for chest pain.  Gastrointestinal: Negative for diarrhea, nausea and vomiting.  Endocrine: Negative for cold intolerance and heat intolerance.  Genitourinary: Negative for difficulty urinating.  Musculoskeletal: Negative for arthralgias, back pain, gait problem, joint swelling, myalgias, neck pain and neck stiffness.  Skin: Positive for rash. Negative for color change, pallor and wound.  Allergic/Immunologic: Positive for environmental allergies. Negative for food allergies.  Neurological: Negative for dizziness, tremors, seizures, syncope, facial asymmetry, speech difficulty,  weakness, light-headedness, numbness and headaches.  Hematological: Negative for adenopathy. Does not bruise/bleed easily.  Psychiatric/Behavioral: Negative for agitation, confusion and sleep disturbance.       Objective:   Physical Exam Musculoskeletal:       Feet:  Skin:         Physical Exam Vitals and nursing note reviewed.  Constitutional:      General: She is awake. She is not in acute distress.    Appearance: Normal appearance. She is well-developed and well-groomed. She is obese. She is not ill-appearing, toxic-appearing or diaphoretic.  HENT:     Head: Normocephalic and atraumatic.     Jaw: There is normal jaw occlusion.     Salivary Glands: Right salivary gland is not diffusely enlarged. Left salivary gland is not diffusely enlarged.     Right Ear: Hearing and external ear normal.     Left Ear: Hearing and external ear normal.     Nose: Nose normal. No congestion or rhinorrhea.     Mouth/Throat:     Pharynx: Oropharynx is clear.  Eyes:     General: Lids are normal. Vision grossly intact. Gaze aligned appropriately. Allergic shiner present. No visual field deficit or scleral icterus.       Right eye: No discharge.        Left eye: No discharge.     Extraocular Movements: Extraocular movements intact.     Conjunctiva/sclera: Conjunctivae normal.     Pupils: Pupils are equal, round, and reactive to light.  Neck:     Trachea: Trachea and phonation normal. No tracheostomy, abnormal tracheal secretions or tracheal deviation.  Cardiovascular:     Rate and Rhythm: Normal rate and regular rhythm.     Pulses: Normal pulses.          Dorsalis pedis pulses  are 2+ on the right side and 2+ on the left side.  Pulmonary:     Effort: Pulmonary effort is normal. No respiratory distress.     Breath sounds: Normal breath sounds and air entry. No stridor or transmitted upper airway sounds. No wheezing.     Comments: Wearing cloth mask due to covid pandemic; spoke full sentences  without difficulty; no cough observed in exam room Abdominal:     General: Abdomen is flat.     Palpations: Abdomen is soft.  Musculoskeletal:        General: No swelling, deformity or signs of injury. Normal range of motion.     Right shoulder: Normal. No swelling, deformity, effusion or laceration.     Left shoulder: Normal. No swelling, deformity, effusion or laceration.     Right elbow: Normal. No swelling, deformity, effusion or lacerations.     Left elbow: Normal. No swelling, deformity, effusion or lacerations.     Right wrist: Normal. No swelling, deformity, effusion or lacerations. Normal range of motion.     Left wrist: Normal. No swelling, deformity, effusion or lacerations. Normal range of motion.     Right hand: No swelling, deformity, lacerations, tenderness or bony tenderness. Normal range of motion. Normal strength. Normal sensation. Normal capillary refill. Normal pulse.     Left hand: No swelling, deformity, lacerations, tenderness or bony tenderness. Normal range of motion. Normal strength. Normal sensation. Normal capillary refill. Normal pulse.     Cervical back: Normal range of motion and neck supple. No edema, erythema, signs of trauma, rigidity, torticollis or crepitus. No pain with movement. Normal range of motion.     Right lower leg: No edema.     Left lower leg: No edema.     Right foot: Normal range of motion. No deformity, bunion, Charcot foot, foot drop or prominent metatarsal heads.     Left foot: Normal range of motion. No deformity, bunion, Charcot foot, foot drop or prominent metatarsal heads.       Feet:  Feet:     Right foot:     Skin integrity: warmth, callus and dry skin present. No ulcer, blister or erythema.     Toenail Condition: Right toenails are normal.     Left foot:     Skin integrity: warmth, callus and dry skin present. No ulcer, blister or erythema.     Toenail Condition: Left toenails are normal.     Comments: Scaling right lateral  midfoot sparing calcaneous/toes except distal second toe with scaling tip; no maceration between toes today; skin warm dry and pink not TTP but patient reported itching;  Lymphadenopathy:     Cervical:     Right cervical: No superficial cervical adenopathy.    Left cervical: No superficial cervical adenopathy.  Skin:    General: Skin is warm and dry.     Capillary Refill: Capillary refill takes less than 2 seconds.     Coloration: Skin is not ashen, cyanotic, jaundiced, mottled, pale or sallow.     Findings: Abrasion and rash present. No abscess, acne, bruising, burn, ecchymosis, erythema, signs of injury, laceration, lesion, petechiae or wound. Rash is macular, papular and scaling. Rash is not crusting, nodular, purpuric, pustular, urticarial or vesicular.     Nails: There is no clubbing.          Comments: See foot exam gross scaling right midfoot overlying callous; callous overlying MTP joints bilateral feet; left palmar surface hand 1cmx3cm laterally from MCP joint towards wrist  epithelial layer peeling in nummular pattern dry no discharge thenal/ulnar prominance; no discharge or pain noted  Neurological:     General: No focal deficit present.     Mental Status: She is alert and oriented to person, place, and time. Mental status is at baseline.     GCS: GCS eye subscore is 4. GCS verbal subscore is 5. GCS motor subscore is 6.     Cranial Nerves: Cranial nerves are intact. No cranial nerve deficit, dysarthria or facial asymmetry.     Sensory: Sensation is intact. No sensory deficit.     Motor: Motor function is intact. No weakness, tremor, atrophy, abnormal muscle tone or seizure activity.     Coordination: Coordination is intact. Coordination normal.     Gait: Gait is intact. Gait normal.     Comments: Gait sure and steady in clinic; in/out of chair without difficulty; bilateral hand grasp equal 5/5  Psychiatric:        Attention and Perception: Attention and perception normal.         Mood and Affect: Mood and affect normal.        Speech: Speech normal.        Behavior: Behavior normal. Behavior is cooperative.        Thought Content: Thought content normal.        Cognition and Memory: Cognition and memory normal.        Judgment: Judgment normal.            Assessment & Plan:  A-Atopic dermatitis and BMi 35  P-Betamethasone discontinued and electronic Rx tacrolimus trial ointment 0.1% topical affected areas only BID for 6 weeks or less  ordered for patient #1 RF0 sent to her pharmacy of choice.  Encouraged more frequent emollient use on fingers/hands at work and consider saran wrap over emollient/tacrolimus at bedtime.  Left hand and right foot worst areas.  Patient reported she is ambidextrous for activities.  Patient on claritin 10mg  po daily may switch to zyrtec 10mg  po BID prn itching.  calagel has not been helpful.  Wash hands before and after application.  Avoid hot steam showers.  Apply emollient twice a day e.g. Fragrance free vaseline.  Given 4 packages UD aquaphor to use at work discussed papers absorb oils from fingertips and if she is handling product stickers these do also and probably why her fingertips are dry and excoriated.  More frequent application of emollient or consider gloves.  May apply ice/cold compress 5 minutes QID prn itching/swelling.  Tepid shower not steam shower as can worsen rash/rash symptoms/dry out skin further/strip of natural oils.   Shower when she finishes work/prior to bedtime.  Avoid harsh/abrasive soaps use fragrance free/sensitive like dove/cetaphil.    Medication as directed. Call or return to clinic as needed if these symptoms worsen or fail to improve as anticipated.  Discussed if socks sweaty consider changing mid day. Discussed for some people bleach bath is helpful typically 1 cup bleach to full tub of water if only using basin then a couple tablespoons per foot basin for hand/feet weekly to see if any improvement.   Patient to notify us if tacrolimus not working as well as betamethasone.  If no relief with plan of care consider dermatology referral. [employer] handout on atopic dermatitis printed and given to patient.  Patient verbalized agreement and understanding of treatment plan and had no further questions at this time  Continuing noom had plateued over holiday/winter but now that weather nicer planning to  increase outdoor activity.  Discussed with patient BMI under 40 now and no longer in high risk obesity category and congratulated patient on weight maintanence.  Encouraged patient to continue her weight loss efforts.  Patient verbalized understanding information/instructions agreed with plan of care and had no further questions at this time.

## 2020-05-29 MED ORDER — SALINE SPRAY 0.65 % NA SOLN: 2.0000 | NASAL | 0 refills | Status: DC

## 2020-07-21 ENCOUNTER — Telehealth: Payer: Self-pay | Admitting: *Deleted

## 2020-07-21 DIAGNOSIS — Z20822 Contact with and (suspected) exposure to covid-19: Secondary | ICD-10-CM

## 2020-07-21 NOTE — Telephone Encounter (Signed)
RN notified by HR that was a possible close contact for a now Covid positive coworker. When HR notified pt, she reported a fever and congestion/cold like sx that began today. She reported to HR that she took a home test this morning which was negative but was planning to take another test tomorrow morning.   Called pt to discuss sx and quarantine dates.  Pt reports sneezing, mostly dry cough, temp 99.0 (her normal 97.5). She is already taking Ibuprofen, Tylenol,  And Nyquil. Has nasal saline and azelastine at home that she is going to start tonight as well.  Pt reported sx started last night, 5/14 Day 0 07/20/20 Day 5 07/25/20 RTW 07/26/20 with strict mask use thru 07/31/20.   Pt self scheduled Covid test at [facility] for tomorrow 5/16 at 1030.  Will plan for f/u Tuesday afternoon for sx check and possible test results. Pt verbalizes understanding and agreement. Denies further questions/concerns.

## 2020-07-23 ENCOUNTER — Encounter: Payer: Self-pay | Admitting: *Deleted

## 2020-07-23 NOTE — Telephone Encounter (Signed)
Reviewed RN Hildred Alamin note symptoms improving and test covid results still pending

## 2020-07-23 NOTE — Telephone Encounter (Signed)
Reviewed RN Haley note agreed with plan of care. 

## 2020-07-23 NOTE — Telephone Encounter (Signed)
Spoke with pt. Fever has resolved. Rhinorrhea improving as well. Test results from yesterday still pending. Denies new or worsening sx. Denies needs or concerns at this time.

## 2020-07-25 NOTE — Telephone Encounter (Signed)
Reviewed RN Hildred Alamin note; negative covid test and symptoms resolved patient cleared to return onsite with strict mask wear.  Agreed with RN Hildred Alamin plan of care.

## 2020-07-25 NOTE — Telephone Encounter (Signed)
Pt emailed her negative Covid test results. Spoke with pt by phone. She reports all sx resolved. No new or recurrent. Ready to RTW. Cleared to RTW 5/20 with strict mask use thru 5/25. Denies further needs or concerns.

## 2020-07-26 NOTE — Telephone Encounter (Signed)
Pt did RTW today as expected with strict mask use thru 5/25. Denies needs or concerns. Closing encounter.

## 2020-07-27 NOTE — Telephone Encounter (Signed)
Reviewed RN Hildred Alamin note patient returned to work as expected onsite.

## 2020-08-08 NOTE — Telephone Encounter (Signed)
Followed up with patient in workcenter feeling well no concerns.  Spoke full sentences without difficulty no cough/nasal congestion/throat clearing.  A&Ox3 respirations even and unlabored skin warm dry and pink.

## 2020-08-20 ENCOUNTER — Other Ambulatory Visit: Payer: Self-pay

## 2020-08-20 ENCOUNTER — Ambulatory Visit: Payer: Self-pay | Admitting: Registered Nurse

## 2020-08-20 VITALS — BP 118/72 | HR 72 | Temp 98.2°F

## 2020-08-20 DIAGNOSIS — Z6835 Body mass index (BMI) 35.0-35.9, adult: Secondary | ICD-10-CM

## 2020-08-20 DIAGNOSIS — J019 Acute sinusitis, unspecified: Secondary | ICD-10-CM

## 2020-08-20 DIAGNOSIS — J209 Acute bronchitis, unspecified: Secondary | ICD-10-CM

## 2020-08-20 MED ORDER — PREDNISONE 10 MG (21) PO TBPK: ORAL_TABLET | ORAL | 0 refills | Status: DC

## 2020-08-20 MED ORDER — AMOXICILLIN 875 MG PO TABS: 875.0000 mg | ORAL_TABLET | Freq: Two times a day (BID) | ORAL | 0 refills | Status: AC

## 2020-08-20 NOTE — Patient Instructions (Signed)
Nonallergic Rhinitis Nonallergic rhinitis is inflammation of the mucous membrane inside the nose. The mucous membrane is the tissue that produces mucus. This condition is different from having allergic rhinitis, which is an allergy that affects the nose. Allergic rhinitis occurs when the body's defense system, or immune system, reacts to a substance that a person is allergic to (allergen), such as pollen, pet dander, mold, or dust. Nonallergic rhinitis has manysimilar symptoms, but it is not caused by allergens. Nonallergic rhinitis can be an acute or chronic problem. This means it can beshort-term or long-term. What are the causes? This condition may be caused by many different things. Some common types of nonallergic rhinitis include: Infectious rhinitis. This is usually caused by an infection in the nose, throat, or upper airways (upper respiratory system). Vasomotor rhinitis. This is the most common type of chronic nonallergic rhinitis. It is caused by too much blood flow through your nose, and it leads to swelling in your nose. It is triggered by strong odors, cold air, stress, drinking alcohol, cigarette smoke, or changes in the weather. Occupational rhinitis. This type is caused by triggers in the workplace, such as chemicals, dust, animal dander, or air pollution. Hormonal rhinitis, in teenage girls and women. This type is caused by an increase in the hormone estrogen and may happen during pregnancy, puberty, or monthly menstrual periods. Hormonal rhinitis gives you fewer symptoms when estrogen levels drop. Drug-induced rhinitis. Several types of medicines can cause this, such as medicines for high blood pressure or heart disease, aspirin, or NSAIDs. Nonallergic rhinitis with eosinophilia syndrome (NARES). This type is caused by having too much eosinophil, a type of white blood cell. Other causes include a reaction to eating hot or spicy foods. This does notusually cause long-term symptoms. In  some cases, the cause of nonallergic rhinitis is not known. What increases the risk? You are more likely to develop this condition if: You are 95-30 years of age. You are a woman. Women are twice as likely to have this condition. What are the signs or symptoms? Common symptoms of this condition include: Stuffy nose (nasal congestion). Runny nose. A feeling of mucus dripping down the back of your throat (postnasal drip). Trouble sleeping. Tiredness, or fatigue. Other symptoms include: Sneezing. Coughing. Itchy nose. Bloodshot eyes. How is this diagnosed? This type may be diagnosed based on: Your symptoms and medical history. A physical exam. Allergy testing to rule out allergic rhinitis. You may have skin tests or blood tests. Your health care provider may also take a swab of nasal discharge to look for an increased number of eosinophils. This would be done to confirm a diagnosisof NARES. How is this treated? Treatment for this condition depends on the cause. No single treatment works for everyone. Work with your health care provider to find the best treatment for you. Treatment may include: Avoiding the things that trigger your symptoms. Medicines to relieve congestion, such as: Steroid nasal spray. There are many types. You may need to try a few to find out which one works best. Best boy medicine. This treats nasal congestion and may be given by mouth or as a nasal spray. These medicines are used only for a short time. Medicines to relieve a runny nose. These may include antihistamine medicines or anticholinergic nasal sprays. Nasal irrigation. This involves using a salt-water (saline) spray or saline container called a neti pot. Nasal irrigation helps to clear away mucus and keep your nasal passages moist. Surgery to remove part of your mucous membrane.  This is done in severe cases if the condition has not improved after 6-12 months of treatment. Follow these instructions at  home: Medicines Take or use over-the-counter and prescription medicines only as told by your health care provider. Do not stop using your medicine even if you start to feel better. Do not take NSAIDs, such as ibuprofen, or medicines that contain aspirin if they make your symptoms worse. Lifestyle Do not drink alcohol if it makes your symptoms worse. Do not use any products that contain nicotine or tobacco, such as cigarettes, e-cigarettes, and chewing tobacco. If you need help quitting, ask your health care provider. Avoid secondhand smoke. General instructions Avoid triggers that make your symptoms worse. Use nasal irrigation as told by your health care provider. Get exercise. Exercise may help reduce symptoms for some people. Sleep with the head of your bed raised. This may reduce nasal congestion when you sleep. Drink enough fluid to keep your urine pale yellow. Keep all follow-up visits as told by your health care provider. This is important. Contact a health care provider if: You have a fever. Your symptoms are getting worse at home. Your symptoms do not lessen with medicine. You develop new symptoms, especially a headache or nosebleed. Summary Nonallergic rhinitis is inflammation inside the nose that is not caused by allergens. Nonallergic rhinitis can be a short-term or long-term problem. Treatment may include avoiding the things that trigger your symptoms. Take or use over-the-counter and prescription medicines only as told by your health care provider. Do not stop using your medicine even if you start to feel better. Contact a health care provider if your symptoms do not lessen with medicine. This information is not intended to replace advice given to you by your health care provider. Make sure you discuss any questions you have with your healthcare provider. Document Revised: 01/02/2019 Document Reviewed: 01/02/2019 Elsevier Patient Education  2022 [employer]. Sinusitis,  Adult Sinusitis is inflammation of your sinuses. Sinuses are hollow spaces in the bones around your face. Your sinuses are located: Around your eyes. In the middle of your forehead. Behind your nose. In your cheekbones. Mucus normally drains out of your sinuses. When your nasal tissues become inflamed or swollen, mucus can become trapped or blocked. This allows bacteria, viruses, and fungi to grow, which leads to infection. Most infections of thesinuses are caused by a virus. Sinusitis can develop quickly. It can last for up to 4 weeks (acute) or for more than 12 weeks (chronic). Sinusitis often develops after a cold. What are the causes? This condition is caused by anything that creates swelling in the sinuses or stops mucus from draining. This includes: Allergies. Asthma. Infection from bacteria or viruses. Deformities or blockages in your nose or sinuses. Abnormal growths in the nose (nasal polyps). Pollutants, such as chemicals or irritants in the air. Infection from fungi (rare). What increases the risk? You are more likely to develop this condition if you: Have a weak body defense system (immune system). Do a lot of swimming or diving. Overuse nasal sprays. Smoke. What are the signs or symptoms? The main symptoms of this condition are pain and a feeling of pressure around the affected sinuses. Other symptoms include: Stuffy nose or congestion. Thick drainage from your nose. Swelling and warmth over the affected sinuses. Headache. Upper toothache. A cough that may get worse at night. Extra mucus that collects in the throat or the back of the nose (postnasal drip). Decreased sense of smell and taste. Fatigue. A  fever. Sore throat. Bad breath. How is this diagnosed? This condition is diagnosed based on: Your symptoms. Your medical history. A physical exam. Tests to find out if your condition is acute or chronic. This may include: Checking your nose for nasal  polyps. Viewing your sinuses using a device that has a light (endoscope). Testing for allergies or bacteria. Imaging tests, such as an MRI or CT scan. In rare cases, a bone biopsy may be done to rule out more serious types offungal sinus disease. How is this treated? Treatment for sinusitis depends on the cause and whether your condition is chronic or acute. If caused by a virus, your symptoms should go away on their own within 10 days. You may be given medicines to relieve symptoms. They include: Medicines that shrink swollen nasal passages (topical intranasal decongestants). Medicines that treat allergies (antihistamines). A spray that eases inflammation of the nostrils (topical intranasal corticosteroids). Rinses that help get rid of thick mucus in your nose (nasal saline washes). If caused by bacteria, your health care provider may recommend waiting to see if your symptoms improve. Most bacterial infections will get better without antibiotic medicine. You may be given antibiotics if you have: A severe infection. A weak immune system. If caused by narrow nasal passages or nasal polyps, you may need to have surgery. Follow these instructions at home: Medicines Take, use, or apply over-the-counter and prescription medicines only as told by your health care provider. These may include nasal sprays. If you were prescribed an antibiotic medicine, take it as told by your health care provider. Do not stop taking the antibiotic even if you start to feel better. Hydrate and humidify  Drink enough fluid to keep your urine pale yellow. Staying hydrated will help to thin your mucus. Use a cool mist humidifier to keep the humidity level in your home above 50%. Inhale steam for 10-15 minutes, 3-4 times a day, or as told by your health care provider. You can do this in the bathroom while a hot shower is running. Limit your exposure to cool or dry air.  Rest Rest as much as possible. Sleep with your  head raised (elevated). Make sure you get enough sleep each night. General instructions  Apply a warm, moist washcloth to your face 3-4 times a day or as told by your health care provider. This will help with discomfort. Wash your hands often with soap and water to reduce your exposure to germs. If soap and water are not available, use hand sanitizer. Do not smoke. Avoid being around people who are smoking (secondhand smoke). Keep all follow-up visits as told by your health care provider. This is important.  Contact a health care provider if: You have a fever. Your symptoms get worse. Your symptoms do not improve within 10 days. Get help right away if: You have a severe headache. You have persistent vomiting. You have severe pain or swelling around your face or eyes. You have vision problems. You develop confusion. Your neck is stiff. You have trouble breathing. Summary Sinusitis is soreness and inflammation of your sinuses. Sinuses are hollow spaces in the bones around your face. This condition is caused by nasal tissues that become inflamed or swollen. The swelling traps or blocks the flow of mucus. This allows bacteria, viruses, and fungi to grow, which leads to infection. If you were prescribed an antibiotic medicine, take it as told by your health care provider. Do not stop taking the antibiotic even if you start to feel  better. Keep all follow-up visits as told by your health care provider. This is important. This information is not intended to replace advice given to you by your health care provider. Make sure you discuss any questions you have with your healthcare provider. Document Revised: 07/26/2017 Document Reviewed: 07/26/2017 Elsevier Patient Education  2022 [place]. Acute Bronchitis, Adult  Acute bronchitis is sudden or acute swelling of the air tubes (bronchi) in the lungs. Acute bronchitis causes these tubes to fill with mucus, whichcan make it hard to breathe.  It can also cause coughing or wheezing. In adults, acute bronchitis usually goes away within 2 weeks. A cough caused by bronchitis may last up to 3 weeks. Smoking, allergies, and asthma can make thecondition worse. What are the causes? This condition can be caused by germs and by substances that irritate the lungs, including: Cold and flu viruses. The most common cause of this condition is the virus that causes the common cold. Bacteria. Substances that irritate the lungs, including: Smoke from cigarettes and other forms of tobacco. Dust and pollen. Fumes from chemical products, gases, or burned fuel. Other materials that pollute indoor or outdoor air. Close contact with someone who has acute bronchitis. What increases the risk? The following factors may make you more likely to develop this condition: A weak body's defense system, also called the immune system. A condition that affects your lungs and breathing, such as asthma. What are the signs or symptoms? Common symptoms of this condition include: Lung and breathing problems, such as: Coughing. This may bring up clear, yellow, or green mucus from your lungs (sputum). Wheezing. Having too much mucus in your lungs (chest congestion). Having shortness of breath. A fever. Chills. Aches and pains, including: Tightness in your chest and other body aches. A sore throat. How is this diagnosed? This condition is usually diagnosed based on: Your symptoms and medical history. A physical exam. You may also have other tests, including tests to rule out other conditions, such as pneumonia. These tests include: A test of lung function. Test of a mucus sample to look for the presence of bacteria. Tests to check the oxygen level in your blood. Blood tests. Chest X-ray. How is this treated? Most cases of acute bronchitis clear up over time without treatment. Your health care provider may recommend: Drinking more fluids. This can thin your  mucus, which may improve your breathing. Using a device that gets medicine into your lungs (inhaler) to help improve breathing and control coughing. Using a vaporizer or a humidifier. These are machines that add water to the air to help you breathe better. Taking a medicine for a fever. Taking a medicine that thins mucus and clears congestion (expectorant). Taking a medicine that prevents or stops coughing (cough suppressant). Follow these instructions at home: Activity Get plenty of rest. Return to your normal activities as told by your health care provider. Ask your health care provider what activities are safe for you. Lifestyle  Drink enough fluid to keep your urine pale yellow. Do not drink alcohol. Do not use any products that contain nicotine or tobacco, such as cigarettes, e-cigarettes, and chewing tobacco. If you need help quitting, ask your health care provider. Be aware that: Your bronchitis will get worse if you smoke or breathe in other people's smoke (secondhand smoke). Your lungs will heal faster if you quit smoking.  General instructions Take over-the-counter and prescription medicines only as told by your health care provider. Use an inhaler, vaporizer, or humidifier as  told by your health care provider. If you have a sore throat, gargle with a salt-water mixture 3-4 times a day or as needed. To make a salt-water mixture, completely dissolve -1 tsp (3-6 g) of salt in 1 cup (237 mL) of warm water. Take two teaspoons of honey at bedtime to lessen coughing at night. Keep all follow-up visits as told by your health care provider. This is important. How is this prevented? To lower your risk of getting this condition again: Wash your hands often with soap and water. If soap and water are not available, use hand sanitizer. Avoid contact with people who have cold symptoms. Try not to touch your mouth, nose, or eyes with your hands. Avoid places where there are fumes from  chemicals. Breathing these fumes will make your condition worse. Get the flu shot every year. Contact a health care provider if: Your symptoms do not improve after 2 weeks of treatment. You vomit more than once or twice. You have symptoms of dehydration such as: Dark urine. Dry skin or eyes. Increased thirst. Headaches. Confusion. Muscle cramps. Get help right away if you: Cough up blood. Feel pain in your chest. Have severe shortness of breath. Faint or keep feeling like you are going to faint. Have a severe headache. Have fever or chills that get worse. These symptoms may represent a serious problem that is an emergency. Do not wait to see if the symptoms will go away. Get medical help right away. Call your local emergency services (911 in the [place] Do not drive yourself to the hospital. Summary Acute bronchitis is sudden (acute) inflammation of the air tubes (bronchi) between the windpipe and the lungs. In adults, acute bronchitis usually goes away within 2 weeks, although coughing may last 3 weeks or longer. Take over-the-counter and prescription medicines only as told by your health care provider. Drink enough fluid to keep your urine pale yellow. Contact a health care provider if your symptoms do not improve after 2 weeks of treatment. Get help right away if you cough up blood, faint, or have chest pain or shortness of breath. This information is not intended to replace advice given to you by your health care provider. Make sure you discuss any questions you have with your healthcare provider. Document Revised: 01/24/2020 Document Reviewed: 09/16/2018 Elsevier Patient Education  2022 [place]. How to Perform a Sinus Rinse A sinus rinse is a home treatment that is used to rinse your sinuses with a sterile mixture of salt and water (saline solution). Sinuses are air-filled spaces in your skull behind the bones of your faceand forehead that open into your nasal cavity. A sinus  rinse can help to clear mucus, dirt, dust, or pollen from your nasal cavity. You may do a sinus rinse when you have a cold, a virus, nasal allergysymptoms, a sinus infection, or stuffiness in your nose or sinuses. Talk with your health care provider about whether a sinus rinse might help you. What are the risks? A sinus rinse is generally safe and effective. However, there are a few risks, which include: A burning sensation in your sinuses. This may happen if you do not make the saline solution as directed. Be sure to follow all directions when making the saline solution. Nasal irritation. Infection from contaminated water. This is rare, but possible. Do not do a sinus rinse if you have had ear or nasal surgery, ear infection, orblocked ears. Supplies needed: Saline solution or powder. Distilled or sterile water to mix  with saline powder. You may use boiled and cooled tap water. Boil tap water for 5 minutes; cool until it is lukewarm. Use within 24 hours. Do not use regular tap water to mix with the saline solution. Neti pot or nasal rinse bottle. These supplies release the saline solution into your nose and through your sinuses. Neti pots and nasal rinse bottles can be purchased at [employer], a health food store, or online. How to perform a sinus rinse  Wash your hands with soap and water. Wash your device according to the directions that came with the product and then dry it. Use the solution that comes with your product or one that is sold separately in stores. Follow the mixing directions on the package to mix with sterile or distilled water. Fill the device with the amount of saline solution noted in the device instructions. Stand over a sink and tilt your head sideways over the sink. Place the spout of the device in your upper nostril (the one closer to the ceiling). Gently pour or squeeze the saline solution into your nasal cavity. The liquid should drain out from the lower  nostril if you are not too congested. While rinsing, breathe through your open mouth. Gently blow your nose to clear any mucus and rinse solution. Blowing too hard may cause ear pain. Repeat in your other nostril. Clean and rinse your device with clean water and then air-dry it. Talk with your health care provider or pharmacist if you have questions abouthow to do a sinus rinse. Summary A sinus rinse is a home treatment that is used to rinse your sinuses with a sterile mixture of salt and water (saline solution). A sinus rinse is generally safe and effective. Follow all instructions carefully. Before doing a sinus rinse, talk with your health care provider about whether it would be helpful for you. This information is not intended to replace advice given to you by your health care provider. Make sure you discuss any questions you have with your healthcare provider. Document Revised: 12/05/2019 Document Reviewed: 12/05/2019 Elsevier Patient Education  2022 [employer].

## 2020-08-20 NOTE — Progress Notes (Signed)
Subjective:    Patient ID: Sandra Lara, female    DOB: 1960/11/27, 60 y.o.   MRN: [id number]  59y/o Caucasian established female pt presenting for eczema follow up and chest congestion and runny nose. Ezcema feels under control on feet but now is present on palms of hands and is more noticeable and bothersome. Using Tacrolimus and Curel lotion but palmar surface of hand will not improve scaling.  Wearing emollient and cotton gloves to bed.  Thinks partially due to frequent handwashing with covid pandemic.  Tries to avoid hand sanitizer.  Wondering if she should seen dermatology.  Chest congestion and runny nose started June 4-5. Checked home Covid test Tues 6/7-negative. Fever 6/8, slept for 18 hours. Retested home covid 6/8-negative. Fever resolved Wed. No fever since then. Chest congestion and rhinorrhea persist. Moving phlegm when coughing. Small amounts of yellow/green mucus. Today is Day 9-10 from start of symptoms. Teeth pain improving but coughing up mucous yellow/green/tan.  Denied wheezing/shortness of breath/hasn't required inhaler use.  Doesn't feel like when she gets bad bronchitis or sinus infection but in between one that is starting and full blown.  There has been known covid positive persons in work building and community rate of spread per [place]  Stopped [employer] use for weight loss as company raised their prices and summer starting when she can typically keep on track with diet and exercise.  Harder for her in the winter months/holidays.  Has gained a few pounds since stopping program.     Review of Systems  Constitutional:  Negative for activity change, appetite change, chills, diaphoresis, fatigue and fever.  HENT:  Positive for congestion, postnasal drip, rhinorrhea, sinus pressure, sinus pain and sneezing. Negative for dental problem, drooling, ear discharge, ear pain, facial swelling, hearing loss, mouth  sores, nosebleeds, sore throat, tinnitus and trouble swallowing.   Eyes:  Negative for photophobia, pain, discharge, itching and visual disturbance.  Respiratory:  Positive for cough. Negative for shortness of breath, wheezing and stridor.   Cardiovascular:  Negative for chest pain.  Gastrointestinal:  Negative for diarrhea, nausea and vomiting.  Endocrine: Negative for cold intolerance and heat intolerance.  Genitourinary:  Negative for difficulty urinating.  Musculoskeletal:  Negative for neck pain and neck stiffness.  Skin:  Positive for rash. Negative for color change, pallor and wound.  Allergic/Immunologic: Positive for environmental allergies. Negative for food allergies.  Neurological:  Positive for headaches. Negative for dizziness, tremors, seizures, syncope, facial asymmetry, speech difficulty, weakness, light-headedness and numbness.  Hematological:  Negative for adenopathy. Does not bruise/bleed easily.  Psychiatric/Behavioral:  Negative for agitation, confusion and sleep disturbance.       Objective:   Physical Exam Vitals and nursing note reviewed.  Constitutional:      General: She is awake. She is not in acute distress.    Appearance: Normal appearance. She is well-developed and well-groomed. She is obese. She is ill-appearing. She is not toxic-appearing or diaphoretic.  HENT:     Head: Normocephalic and atraumatic.     Jaw: There is normal jaw occlusion. No trismus, tenderness, swelling, pain on movement or malocclusion.     Salivary Glands: Right salivary gland is not diffusely enlarged or tender. Left salivary gland is not diffusely enlarged or tender.     Right Ear: Hearing, ear canal and external ear normal. A middle ear effusion is present. There is no impacted cerumen.     Left Ear: Hearing, ear canal and external ear  normal. A middle ear effusion is present. There is no impacted cerumen.     Nose: Mucosal edema, congestion and rhinorrhea present. No nasal  deformity, septal deviation, signs of injury, laceration or nasal tenderness. Rhinorrhea is clear.     Right Turbinates: Enlarged and swollen. Not pale.     Left Turbinates: Enlarged and swollen. Not pale.     Right Sinus: Maxillary sinus tenderness present. No frontal sinus tenderness.     Left Sinus: Maxillary sinus tenderness present. No frontal sinus tenderness.     Comments: Maxillary sinuses slightly TTP bilaterally; cobblestoning posterior pharynx; bilateral TMs air fluid level clear; patient blowing nose in clinic; clear yellow mucous in nares bilaterally with nasal turbinates edema/erythema; bilateral allergic shiners    Mouth/Throat:     Lips: Pink. No lesions.     Mouth: Mucous membranes are moist. Mucous membranes are not pale, not dry and not cyanotic. No lacerations, oral lesions or angioedema.     Dentition: No dental tenderness, gingival swelling, dental caries, dental abscesses or gum lesions.     Tongue: No lesions. Tongue does not deviate from midline.     Palate: No mass and lesions.     Pharynx: Uvula midline. Pharyngeal swelling and posterior oropharyngeal erythema present. No oropharyngeal exudate or uvula swelling.     Tonsils: No tonsillar exudate or tonsillar abscesses.  Eyes:     General: Lids are normal. Vision grossly intact. Gaze aligned appropriately. Allergic shiner present. No scleral icterus.       Right eye: No foreign body, discharge or hordeolum.        Left eye: No foreign body, discharge or hordeolum.     Extraocular Movements: Extraocular movements intact.     Right eye: Normal extraocular motion and no nystagmus.     Left eye: Normal extraocular motion and no nystagmus.     Conjunctiva/sclera: Conjunctivae normal.     Right eye: Right conjunctiva is not injected. No chemosis, exudate or hemorrhage.    Left eye: Left conjunctiva is not injected. No chemosis, exudate or hemorrhage.    Pupils: Pupils are equal, round, and reactive to light. Pupils are  equal.     Right eye: Pupil is round and reactive.     Left eye: Pupil is round and reactive.  Neck:     Thyroid: No thyroid mass or thyromegaly.     Trachea: Trachea and phonation normal. No tracheal tenderness or tracheal deviation.  Cardiovascular:     Rate and Rhythm: Normal rate and regular rhythm.     Pulses: Normal pulses.          Radial pulses are 2+ on the right side and 2+ on the left side.     Heart sounds: Normal heart sounds, S1 normal and S2 normal. No murmur heard.   No friction rub. No gallop.  Pulmonary:     Effort: Pulmonary effort is normal. No accessory muscle usage or respiratory distress.     Breath sounds: Normal breath sounds and air entry. No stridor, decreased air movement or transmitted upper airway sounds. No decreased breath sounds, wheezing, rhonchi or rales.     Comments: Spoke full sentences without difficulty; mild coughing with rhinitis/blowing nose 1 episode in clinic; given surgical mask to wear in clinic due to covid 19 pandemic Chest:     Chest wall: No tenderness.  Abdominal:     General: There is no distension.     Palpations: Abdomen is soft.  Musculoskeletal:  General: No tenderness. Normal range of motion.     Right shoulder: Normal.     Left shoulder: Normal.     Right hand: Normal.     Left hand: Normal.     Cervical back: Normal range of motion and neck supple. No swelling, edema, deformity, erythema, signs of trauma, lacerations, rigidity, tenderness or crepitus. No pain with movement, spinous process tenderness or muscular tenderness. Normal range of motion.     Thoracic back: No swelling, edema, deformity, signs of trauma or lacerations. Normal range of motion.     Lumbar back: No swelling, edema, deformity, signs of trauma or lacerations. Normal range of motion.     Right hip: Normal.     Left hip: Normal.     Right knee: Normal.     Left knee: Normal.     Right lower leg: No edema.     Left lower leg: No edema.   Lymphadenopathy:     Head:     Right side of head: No submental, submandibular, tonsillar, preauricular, posterior auricular or occipital adenopathy.     Left side of head: No submental, submandibular, tonsillar, preauricular, posterior auricular or occipital adenopathy.     Cervical: No cervical adenopathy.     Right cervical: No superficial, deep or posterior cervical adenopathy.    Left cervical: No superficial, deep or posterior cervical adenopathy.  Skin:    General: Skin is warm and dry.     Capillary Refill: Capillary refill takes less than 2 seconds.     Coloration: Skin is not ashen, cyanotic, jaundiced, mottled, pale or sallow.     Findings: Rash present. No abrasion, abscess, acne, bruising, burn, ecchymosis, erythema, signs of injury, laceration, lesion, petechiae or wound. Rash is scaling. Rash is not crusting, macular, nodular, papular, purpuric, pustular, urticarial or vesicular.     Nails: There is no clubbing.       Neurological:     General: No focal deficit present.     Mental Status: She is alert and oriented to person, place, and time. Mental status is at baseline. She is not disoriented.     GCS: GCS eye subscore is 4. GCS verbal subscore is 5. GCS motor subscore is 6.     Cranial Nerves: Cranial nerves are intact. No cranial nerve deficit, dysarthria or facial asymmetry.     Sensory: Sensation is intact. No sensory deficit.     Motor: Motor function is intact. No weakness, tremor, atrophy, abnormal muscle tone or seizure activity.     Coordination: Coordination is intact. Coordination normal.     Gait: Gait is intact. Gait normal.     Comments: In/out of chair and on/off exam table without difficulty; bilateral hand grasp equal 5/5; gait sure and steady in clinic  Psychiatric:        Attention and Perception: Attention and perception normal.        Mood and Affect: Mood and affect normal.        Speech: Speech normal.        Behavior: Behavior normal. Behavior is  cooperative.        Thought Content: Thought content normal.        Cognition and Memory: Cognition and memory normal.        Judgment: Judgment normal.          Assessment & Plan:  A-acute rhinosinusitis/bronchitis, contact dermatitis subsequent encounter, BMI 35   P- flonase 1 spray each nostril BID, saline 2 sprays each  nostril q2h wa prn congestion discussed when she needs refill see RN Hildred Alamin for 1 bottle from clinic stock. Start prednisone 10mg  po taper with breakfast by mouth (60 day 1/50/40/30/20/10) #21 RF0 dispensed from [facility] to patient.  If symptoms worsening after 48-72 hours use prednisone start amoxicillin 875mg  po BID x 10 days #20 RF0 dispensed from [facility] to patient  Discussed with patient mucous consistent with viral sinusitis at this time and antibiotics will not help.  Denied personal or family history of ENT cancer.  Shower BID especially prior to bed. No evidence of systemic bacterial infection, non toxic and well hydrated.  I do not see where any further testing or imaging is necessary at this time.   I will suggest supportive care, rest, good hygiene and encourage the patient to take adequate fluids.  The patient is to return to clinic or EMERGENCY ROOM if symptoms worsen or change significantly.  Exitcare handoutd on sinusitis and sinus rinse and nonallergic rhinitis.  Patient verbalized agreement and understanding of treatment plan and had no further questions at this time.   P2:  Hand washing and cover cough  Patient wants to see if prednisone for sinusitis/bronchitis helps clear left hand dermatitis.  If not will follow up/she is considering dermatology referral.   calagel has not been helpful.  Still has left over Rx betamethasone diproprionate 0.5% cream apply BID x 2 weeks left hand affected area and cover with emollient e.g. aquaphor/vasline/eucerin ointment or cream not pump lotion  Wash hands before and after application.  Ensure hands are wet before applying soap.   Consider alternating which hand obtains soap from soap dispenser.  Avoid hot steam showers.  Apply emollient twice a day e.g. Fragrance free vaseline.  May apply ice/cold compress 5 minutes QID prn itching/swelling.   Discussed patient processing covid sanitizing efforts may be exposing her to substance on desk that is causing irritation/allergic reaction as only affecting portion of hand that has the most contact with keyboard/desk.  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  Tepid shower not steam shower as can worsen rash/rash symptoms/dry out skin further/strip of natural oils.   Shower when she finishes work/prior to bedtime.  Avoid harsh/abrasive soaps use fragrance free/sensitive like dove/cetaphil.    Medication as directed. Call or return to clinic as needed if these symptoms worsen or fail to improve as anticipated. Exitcare handouts on contact dermatitis and eczema printed and given to patient.  Follow up for re-evaluation in 48 hours if no improvement and/or worsening of rash with plan of care.  Patient verbalized agreement and understanding of treatment plan and had no further questions at this time  [name] paperwork reviewed with RN Hildred Alamin by patient and signed by NP and patient from labs completed earlier this year.  Encouraged patient to continue weight loss efforts e.g. healthy diet/meal preparation, exercise 150 minutes per week has sustained weight loss from Nov 2021.  Patient agreed and had no further questions at this time.

## 2020-09-17 ENCOUNTER — Other Ambulatory Visit: Payer: Self-pay

## 2020-09-17 ENCOUNTER — Ambulatory Visit: Payer: Self-pay | Admitting: Registered Nurse

## 2020-09-17 ENCOUNTER — Encounter: Payer: Self-pay | Admitting: Registered Nurse

## 2020-09-17 VITALS — BP 110/64 | HR 64 | Temp 97.9°F

## 2020-09-17 DIAGNOSIS — R438 Other disturbances of smell and taste: Secondary | ICD-10-CM

## 2020-09-17 DIAGNOSIS — R7303 Prediabetes: Secondary | ICD-10-CM

## 2020-09-17 DIAGNOSIS — L209 Atopic dermatitis, unspecified: Secondary | ICD-10-CM

## 2020-09-17 DIAGNOSIS — J00 Acute nasopharyngitis [common cold]: Secondary | ICD-10-CM

## 2020-09-17 DIAGNOSIS — H6983 Other specified disorders of Eustachian tube, bilateral: Secondary | ICD-10-CM

## 2020-09-17 DIAGNOSIS — R5383 Other fatigue: Secondary | ICD-10-CM

## 2020-09-17 MED ORDER — SALINE SPRAY 0.65 % NA SOLN: 2.0000 | NASAL | 0 refills | Status: DC

## 2020-09-17 MED ORDER — PHENYLEPHRINE HCL 5 MG PO TABS: 5.0000 mg | ORAL_TABLET | Freq: Four times a day (QID) | ORAL | Status: AC | PRN

## 2020-09-17 MED ORDER — BETAMETHASONE DIPROPIONATE 0.05 % EX CREA: TOPICAL_CREAM | Freq: Two times a day (BID) | CUTANEOUS | 0 refills | Status: AC

## 2020-09-17 NOTE — Progress Notes (Signed)
Subjective:    Patient ID: Sandra Lara, female    DOB: [birth year], 60 y.o.   MRN: [id number]  60y/o Caucasian established female pt presenting for eczema follow up. Hands itching after she washes them; feet not so much occasionally between toes  Doesn't like tacrolimus ointment as much as betamethasone cream would like to change back to betamethasone cream needs new Rx.  Ointment greasy feeling on skin all day.  Recently bought new sneakers asics.  Applying Curel jar lotion to feet at bedtime and hands 2-3 times per day has jar at her desk.  Has been gardening.  She also reports that her sinuses or ears are backed up and causing a "swimmy-headed" feeling.  Using all of her nose sprays throughout the day.  Denied allergy flare.  Denied ear discharge/pain.    Some post nasal drip noted.  Not using nasal saline throughout the day but does have bottle in her purse.  Additionally, since about Dec 2021 she has noticed that any steak she eats tastes rotten, chicken same way at times, feels her memory is struggling along with general fatigue and no energy. Newly has noticed difficulty focusing. Denies testing positive for Covid in the past.   Has been gardening denied any recent tick bites.  Has not been applying bug spray/deet since no mosquitoes yet.     Review of Systems  Constitutional:  Positive for fatigue. Negative for activity change, appetite change, chills, diaphoresis and fever.  HENT:  Positive for congestion and postnasal drip. Negative for ear discharge, ear pain, facial swelling, hearing loss, mouth sores, nosebleeds, sinus pressure, sinus pain, sneezing, sore throat, tinnitus, trouble swallowing and voice change.   Eyes:  Negative for photophobia and visual disturbance.  Respiratory:  Negative for cough, shortness of breath, wheezing and stridor.   Cardiovascular:  Negative for chest pain.  Gastrointestinal:  Negative for diarrhea, nausea and vomiting.  Endocrine: Negative for  cold intolerance and heat intolerance.  Genitourinary:  Negative for difficulty urinating.  Musculoskeletal:  Negative for arthralgias, gait problem and myalgias.  Skin:  Positive for rash. Negative for color change, pallor and wound.  Allergic/Immunologic: Positive for environmental allergies. Negative for food allergies.  Neurological:  Negative for tremors, seizures, syncope, facial asymmetry, speech difficulty, light-headedness, numbness and headaches.  Hematological:  Negative for adenopathy. Does not bruise/bleed easily.  Psychiatric/Behavioral:  Negative for agitation and confusion.       Objective:   Physical Exam Vitals and nursing note reviewed.  Constitutional:      General: She is awake. She is not in acute distress.    Appearance: Normal appearance. She is well-developed and well-groomed. She is obese. She is not ill-appearing, toxic-appearing or diaphoretic.  HENT:     Head: Normocephalic and atraumatic.     Jaw: There is normal jaw occlusion. No trismus.     Salivary Glands: Right salivary gland is not diffusely enlarged. Left salivary gland is not diffusely enlarged.     Comments: Cobblestoning posterior pharynx; bilateral TMs air fluid level clear; bilateral allergic shiners    Right Ear: Hearing, ear canal and external ear normal. A middle ear effusion is present.     Left Ear: Hearing, ear canal and external ear normal. A middle ear effusion is present.     Nose: Mucosal edema, congestion and rhinorrhea present. No nasal deformity, septal deviation or laceration. Rhinorrhea is clear.     Right Turbinates: Enlarged and swollen. Not pale.     Left Turbinates: Enlarged  and swollen. Not pale.     Right Sinus: No maxillary sinus tenderness or frontal sinus tenderness.     Left Sinus: No maxillary sinus tenderness or frontal sinus tenderness.     Mouth/Throat:     Lips: Pink. No lesions.     Mouth: Mucous membranes are moist. Mucous membranes are not pale, not dry and not  cyanotic. No lacerations, oral lesions or angioedema.     Dentition: No dental abscesses or gum lesions.     Pharynx: Uvula midline. Pharyngeal swelling and posterior oropharyngeal erythema present. No oropharyngeal exudate or uvula swelling.     Tonsils: No tonsillar abscesses. 0 on the right. 0 on the left.  Eyes:     General: Lids are normal. Vision grossly intact. Gaze aligned appropriately. Allergic shiner present. No scleral icterus.       Right eye: No foreign body, discharge or hordeolum.        Left eye: No foreign body, discharge or hordeolum.     Extraocular Movements: Extraocular movements intact.     Right eye: Normal extraocular motion and no nystagmus.     Left eye: Normal extraocular motion and no nystagmus.     Conjunctiva/sclera: Conjunctivae normal.     Right eye: Right conjunctiva is not injected. No chemosis, exudate or hemorrhage.    Left eye: Left conjunctiva is not injected. No chemosis, exudate or hemorrhage.    Pupils: Pupils are equal, round, and reactive to light. Pupils are equal.     Right eye: Pupil is round and reactive.     Left eye: Pupil is round and reactive.  Neck:     Thyroid: No thyroid mass or thyromegaly.     Trachea: Trachea normal. No tracheal tenderness or tracheal deviation.  Cardiovascular:     Rate and Rhythm: Normal rate and regular rhythm.     Pulses: Normal pulses.          Radial pulses are 2+ on the right side and 2+ on the left side.  Pulmonary:     Effort: Pulmonary effort is normal. No accessory muscle usage or respiratory distress.     Breath sounds: Normal breath sounds and air entry. No stridor. No decreased breath sounds, wheezing, rhonchi or rales.     Comments: Spoke full sentences without difficulty; no throat clearing or nasal sniffing/cough noted in clinic Chest:     Chest wall: No tenderness.  Abdominal:     General: There is no distension.     Palpations: Abdomen is soft.  Musculoskeletal:        General: No swelling,  tenderness, deformity or signs of injury. Normal range of motion.     Right shoulder: Normal. No swelling, deformity, effusion or laceration. Normal range of motion. Normal strength.     Left shoulder: Normal. No swelling, deformity, effusion or laceration. Normal range of motion. Normal strength.     Right elbow: No swelling, deformity, effusion or lacerations. Normal range of motion.     Left elbow: No swelling, deformity, effusion or lacerations. Normal range of motion.     Right hand: Normal. No swelling, deformity or lacerations. Normal range of motion. Normal strength. Normal sensation.     Left hand: Normal. No swelling, deformity or lacerations. Normal range of motion. Normal strength. Normal sensation.     Cervical back: Normal range of motion and neck supple. No swelling, edema, deformity, erythema, signs of trauma, lacerations, rigidity, tenderness or crepitus. Normal range of motion.  Right hip: Normal. No deformity or lacerations. Normal range of motion.     Left hip: Normal. No deformity or lacerations. Normal range of motion.     Right knee: Normal.     Left knee: Normal.     Right lower leg: No edema.     Left lower leg: No edema.     Right ankle: No swelling, deformity, ecchymosis or lacerations.     Left ankle: No swelling, deformity, ecchymosis or lacerations.     Right foot: No deformity, bunion, Charcot foot or foot drop.     Left foot: No deformity, bunion, Charcot foot or foot drop.       Feet:  Feet:     Right foot:     Skin integrity: Warmth, callus, dry skin and fissure present. No ulcer, blister, skin breakdown or erythema.     Left foot:     Skin integrity: Warmth, callus and dry skin present. No ulcer, blister, skin breakdown, erythema or fissure.     Comments: Affected area larger on right foot and fissues noted right foot centrally plantar surface in callouses; no discharge/dry/no maceration between toes patient wearing cotton socks and new asics shoes  running mesh Lymphadenopathy:     Head:     Right side of head: No submental, submandibular, tonsillar, preauricular, posterior auricular or occipital adenopathy.     Left side of head: No submental, submandibular, tonsillar, preauricular, posterior auricular or occipital adenopathy.     Cervical: No cervical adenopathy.     Right cervical: No superficial, deep or posterior cervical adenopathy.    Left cervical: No superficial, deep or posterior cervical adenopathy.  Skin:    General: Skin is warm and dry.     Capillary Refill: Capillary refill takes less than 2 seconds.     Coloration: Skin is not ashen, cyanotic, jaundiced, mottled, pale or sallow.     Findings: Rash present. No abrasion, abscess, acne, bruising, burn, ecchymosis, erythema, signs of injury, laceration, lesion, petechiae or wound. Rash is scaling. Rash is not crusting, macular, nodular, papular, purpuric, pustular, urticarial or vesicular.     Nails: There is no clubbing.          Comments: Central palmar surface callous with fine scaling bilaterally  Neurological:     General: No focal deficit present.     Mental Status: She is alert and oriented to person, place, and time. Mental status is at baseline. She is not disoriented.     GCS: GCS eye subscore is 4. GCS verbal subscore is 5. GCS motor subscore is 6.     Cranial Nerves: Cranial nerves are intact. No cranial nerve deficit, dysarthria or facial asymmetry.     Sensory: Sensation is intact. No sensory deficit.     Motor: Motor function is intact. No weakness, tremor, atrophy, abnormal muscle tone or seizure activity.     Coordination: Coordination is intact. Coordination normal.     Gait: Gait is intact. Gait normal.     Comments: Gait sure and steady in clinic; in/out of chair and on/off exam table without difficulty; bilateral hand grasp equal 5/5  Psychiatric:        Attention and Perception: Attention and perception normal.        Mood and Affect: Mood and  affect normal.        Speech: Speech normal.        Behavior: Behavior normal. Behavior is cooperative.        Thought Content: Thought  content normal.        Cognition and Memory: Cognition and memory normal.        Judgment: Judgment normal.          Assessment & Plan:   A-atopic dermatitis, fatigue, bad taste in mouth when eating meat, acute rhinitis, eustachian tube dysfunction  P-discussed wetting hands first before applying soap; alternate hand which gets soap in palm.  Apply emollient when back at desk.  New rx betamethasone diproprionate 0.5% apply BID affected areas #30gm RF0  Discontinued tacrolimus per patient preference cream versus ointment.  Encouraged more frequent emollient use on fingers/hands at work and consider saran wrap over emollient/betamethasone at bedtime.  bilateral palms and right foot worst areas.  calagel has not been helpful.  Wash hands before and after application.  Avoid hot steam showers.  Apply emollient twice a day e.g. Fragrance free vaseline.  Fingertips no longer excoriated compared to previous exams along with anterior wrists cleared.  May apply ice/cold compress 5 minutes QID prn itching/swelling.  Tepid shower not steam shower as can worsen rash/rash symptoms/dry out skin further/strip of natural oils.   Shower when she finishes work/prior to bedtime.  Avoid harsh/abrasive soaps use fragrance free/sensitive like dove/cetaphil.    Medication as directed. Call or return to clinic as needed if these symptoms worsen or fail to improve as anticipated.  If worsening with plan of care consider dermatology referral. Exitcare handout on atopic dermatitis printed and given to patient.  Patient verbalized agreement and understanding of treatment plan and had no further questions at this time   Exec panel plus Hgab1c today.  History prediabetes off noom plan now had gained back some weight last appt.  Thyroid low normal on last check.  Not eating as much meat due to  bad taste in mouth with chicken/beef will check for anemia/low iron.  Denied tick bites has been working in the garden doesn't put on bug spray unless mosquitoes bad which they haven't been yet this year.  A lot of rain this weekend recommended DEET as local ticks small as freckles if she is working in tall grasses or under trees.  If exec panel and hgba1c normal consider lyme disease testing/covid testing home as moderate transmission rate in county at this time tests 16% positive rate and omicron variants 4 & 5 surging at this time.  Patient not wearing mask at work any longer unless transmission rate in county high and required by employer when following CDC guidelines. Exitcare handout on fatigue printed and given to patient. Patient verbalized understanding information/instructions, agreed with plan of care and had no further questions at this time.  Continue allergy medication (oral claritin and azelastine/nasacort/saline nasal sprays) as prescribed.  Start phenylephrine 5-10mg  po q6h prn rhinitis given 4 UD from clinic stock.  Increase frequency of nasal saline 2 sprays each nostril q2h wa prn congestion/rhinitis. No evidence of invasive bacterial infection, non toxic and well hydrated.  I do not see where any further testing or imaging is necessary at this time.   I will suggest supportive care, rest, good hygiene and encourage the patient to take adequate fluids.  The patient is to return to clinic or EMERGENCY ROOM if symptoms worsen or change significantly e.g. ear pain, fever, purulent discharge from ears or bleeding.  Discussed with patient post nasal drip irritates throat/causes swelling blocks eustachian tubes from draining and fluid fills up middle ear.  Bacteria/viruses can grow in fluid and with moving head tube compressed and  increases pressure in tube/ear worsening pain.  Studies show will take 30 days for fluid to resolve after post nasal drip controlled with nasal steroid/antihistamine.  Antibiotics and steroids do not speed up fluid removal.  Patient verbalized agreement and understanding of treatment plan and had no further questions at this time.

## 2020-09-17 NOTE — Patient Instructions (Signed)
Atopic Dermatitis Atopic dermatitis is a skin disorder that causes inflammation of the skin. It is marked by a red rash and itchy, dry, scaly skin. It is the most common type of eczema. Eczema is a group of skin conditions that cause the skin to become rough and swollen. This condition is generally worse during the cooler wintermonths and often improves during the warm summer months. Atopic dermatitis usually starts showing signs in infancy and can last through adulthood. This condition cannot be passed from one person to another (is not contagious). Atopic dermatitis may not always be present, but when it is, it is called aflare-up. What are the causes? The exact cause of this condition is not known. Flare-ups may be triggered by: Coming in contact with something that you are sensitive or allergic to (allergen). Stress. Certain foods. Extremely hot or cold weather. Harsh chemicals and soaps. Dry air. Chlorine. What increases the risk? This condition is more likely to develop in people who have a personal or family history of: Eczema. Allergies. Asthma. Hay fever. What are the signs or symptoms? Symptoms of this condition include: Dry, scaly skin. Red, itchy rash. Itchiness, which can be severe. This may occur before the skin rash. This can make sleeping difficult. Skin thickening and cracking that can occur over time. How is this diagnosed? This condition is diagnosed based on: Your symptoms. Your medical history. A physical exam. How is this treated? There is no cure for this condition, but symptoms can usually be controlled. Treatment focuses on: Controlling the itchiness and scratching. You may be given medicines, such as antihistamines or steroid creams. Limiting exposure to allergens. Recognizing situations that cause stress and developing a plan to manage stress. If your atopic dermatitis does not get better with medicines, or if it is all over your body (widespread), a  treatment using a specific type of light (phototherapy) may be used. Follow these instructions at home: Skin care  Keep your skin well moisturized. Doing this seals in moisture and helps to prevent dryness. Use unscented lotions that have petroleum in them. Avoid lotions that contain alcohol or water. They can dry the skin. Keep baths or showers short (less than 5 minutes) in warm water. Do not use hot water. Use mild, unscented cleansers for bathing. Avoid soap and bubble bath. Apply a moisturizer to your skin right after a bath or shower. Do not apply anything to your skin without checking with your health care provider.  General instructions Take or apply over-the-counter and prescription medicines only as told by your health care provider. Dress in clothes made of cotton or cotton blends. Dress lightly because heat increases itchiness. When washing your clothes, rinse your clothes twice so all of the soap is removed. Avoid any triggers that can cause a flare-up. Keep your fingernails cut short. Avoid scratching. Scratching makes the rash and itchiness worse. A break in the skin from scratching could result in a skin infection (impetigo). Do not be around people who have cold sores or fever blisters. If you get the infection, it may cause your atopic dermatitis to worsen. Keep all follow-up visits. This is important. Contact a health care provider if: Your itchiness interferes with sleep. Your rash gets worse or is not better within one week of starting treatment. You have a fever. You have a rash flare-up after having contact with someone who has cold sores or fever blisters. Get help right away if: You develop pus or soft yellow scabs in the rash area.  Summary Atopic dermatitis causes a red rash and itchy, dry, scaly skin. Treatment focuses on controlling the itchiness and scratching, limiting exposure to things that you are sensitive or allergic to (allergens), recognizing  situations that cause stress, and developing a plan to manage stress. Keep your skin well moisturized. Keep baths or showers shorter than 5 minutes and use warm water. Do not use hot water. This information is not intended to replace advice given to you by your health care provider. Make sure you discuss any questions you have with your healthcare provider. Document Revised: 12/04/2019 Document Reviewed: 12/04/2019 Elsevier Patient Education  2022 [employer]. Fatigue If you have fatigue, you feel tired all the time and have a lack of energy or a lack of motivation. Fatigue may make it difficult to start or complete tasks because of exhaustion. In general, occasional or mild fatigue is often a normal response to activity or life. However, long-lasting (chronic) or extreme fatigue may be a symptom of a medical condition. Follow these instructions at home: General instructions Watch your fatigue for any changes. Go to bed and get up at the same time every day. Avoid fatigue by pacing yourself during the day and getting enough sleep at night. Maintain a healthy weight. Medicines Take over-the-counter and prescription medicines only as told by your health care provider. Take a multivitamin, if told by your health care provider.  Do not use herbal or dietary supplements unless they are approved by your health care provider. Activity  Exercise regularly, as told by your health care provider. Use or practice techniques to help you relax, such as yoga, tai chi, meditation, or massage therapy.  Eating and drinking  Avoid heavy meals in the evening. Eat a well-balanced diet, which includes lean proteins, whole grains, plenty of fruits and vegetables, and low-fat dairy products. Avoid consuming too much caffeine. Avoid the use of alcohol. Drink enough fluid to keep your urine pale yellow.  Lifestyle Change situations that cause you stress. Try to keep your work and personal schedule in  balance. Do not use any products that contain nicotine or tobacco, such as cigarettes and e-cigarettes. If you need help quitting, ask your health care provider. Do not use drugs. Contact a health care provider if: Your fatigue does not get better. You have a fever. You suddenly lose or gain weight. You have headaches. You have trouble falling asleep or sleeping through the night. You feel angry, guilty, anxious, or sad. You are unable to have a bowel movement (constipation). Your skin is dry. You have swelling in your legs or another part of your body. Get help right away if: You feel confused. Your vision is blurry. You feel faint or you pass out. You have a severe headache. You have severe pain in your abdomen, your back, or the area between your waist and hips (pelvis). You have chest pain, shortness of breath, or an irregular or fast heartbeat. You are unable to urinate, or you urinate less than normal. You have abnormal bleeding, such as bleeding from the rectum, vagina, nose, lungs, or nipples. You vomit blood. You have thoughts about hurting yourself or others. If you ever feel like you may hurt yourself or others, or have thoughts about taking your own life, get help right away. You can go to your nearest emergency department or call: Your local emergency services (911 in the [place] A suicide crisis helpline, such as the [place] at (559) 465-6163. This is open 24 hours a  day. Summary If you have fatigue, you feel tired all the time and have a lack of energy or a lack of motivation. Fatigue may make it difficult to start or complete tasks because of exhaustion. Long-lasting (chronic) or extreme fatigue may be a symptom of a medical condition. Exercise regularly, as told by your health care provider. Change situations that cause you stress. Try to keep your work and personal schedule in balance. This information is not intended to replace advice  given to you by your health care provider. Make sure you discuss any questions you have with your healthcare provider. Document Revised: 01/04/2020 Document Reviewed: 01/04/2020 Elsevier Patient Education  2022 [employer].

## 2020-09-18 ENCOUNTER — Encounter: Payer: Self-pay | Admitting: Registered Nurse

## 2020-09-18 LAB — CMP12+LP+TP+TSH+6AC+CBC/D/PLT
ALT: 12 IU/L (ref 0–32)
AST: 14 IU/L (ref 0–40)
Albumin/Globulin Ratio: 2.5 — ABNORMAL HIGH (ref 1.2–2.2)
Albumin: 4.5 g/dL (ref 3.8–4.9)
Alkaline Phosphatase: 70 IU/L (ref 44–121)
BUN/Creatinine Ratio: 28 (ref 12–28)
BUN: 19 mg/dL (ref 8–27)
Basophils Absolute: 0 10*3/uL (ref 0.0–0.2)
Basos: 1 %
Bilirubin Total: 0.3 mg/dL (ref 0.0–1.2)
Calcium: 9.5 mg/dL (ref 8.7–10.3)
Chloride: 102 mmol/L (ref 96–106)
Chol/HDL Ratio: 3.4 ratio (ref 0.0–4.4)
Cholesterol, Total: 235 mg/dL — ABNORMAL HIGH (ref 100–199)
Creatinine, Ser: 0.69 mg/dL (ref 0.57–1.00)
EOS (ABSOLUTE): 0.2 10*3/uL (ref 0.0–0.4)
Eos: 3 %
Estimated CHD Risk: <0.5 times avg. (ref 0.0–1.0)
Free Thyroxine Index: 1.6 (ref 1.2–4.9)
GGT: 17 IU/L (ref 0–60)
Globulin, Total: 1.8 g/dL (ref 1.5–4.5)
Glucose: 102 mg/dL — ABNORMAL HIGH (ref 65–99)
HDL: 70 mg/dL (ref >39)
Hematocrit: 41.5 % (ref 34.0–46.6)
Hemoglobin: 13.9 g/dL (ref 11.1–15.9)
Immature Grans (Abs): 0 10*3/uL (ref 0.0–0.1)
Immature Granulocytes: 0 %
Iron: 87 ug/dL (ref 27–159)
LDH: 192 IU/L (ref 119–226)
LDL Chol Calc (NIH): 133 mg/dL — ABNORMAL HIGH (ref 0–99)
Lymphocytes Absolute: 1.4 10*3/uL (ref 0.7–3.1)
Lymphs: 24 %
MCH: 30.8 pg (ref 26.6–33.0)
MCHC: 33.5 g/dL (ref 31.5–35.7)
MCV: 92 fL (ref 79–97)
Monocytes Absolute: 0.5 10*3/uL (ref 0.1–0.9)
Monocytes: 8 %
Neutrophils Absolute: 4 10*3/uL (ref 1.4–7.0)
Neutrophils: 64 %
Phosphorus: 3.7 mg/dL (ref 3.0–4.3)
Platelets: 263 10*3/uL (ref 150–450)
Potassium: 4.5 mmol/L (ref 3.5–5.2)
RBC: 4.51 x10E6/uL (ref 3.77–5.28)
RDW: 13.3 % (ref 11.7–15.4)
Sodium: 140 mmol/L (ref 134–144)
T3 Uptake Ratio: 23 % — ABNORMAL LOW (ref 24–39)
T4, Total: 7.1 ug/dL (ref 4.5–12.0)
TSH: 0.979 u[IU]/mL (ref 0.450–4.500)
Total Protein: 6.3 g/dL (ref 6.0–8.5)
Triglycerides: 183 mg/dL — ABNORMAL HIGH (ref 0–149)
Uric Acid: 5.1 mg/dL (ref 3.0–7.2)
VLDL Cholesterol Cal: 32 mg/dL (ref 5–40)
WBC: 6.1 10*3/uL (ref 3.4–10.8)
eGFR: 99 mL/min/{1.73_m2} (ref >59)

## 2020-09-18 LAB — HGB A1C W/O EAG: Hgb A1c MFr Bld: 5.7 % — ABNORMAL HIGH (ref 4.8–5.6)

## 2020-09-26 ENCOUNTER — Telehealth: Payer: Self-pay | Admitting: Registered Nurse

## 2020-09-26 ENCOUNTER — Encounter: Payer: Self-pay | Admitting: Registered Nurse

## 2020-09-26 DIAGNOSIS — Z20822 Contact with and (suspected) exposure to covid-19: Secondary | ICD-10-CM
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
## 2020-09-26 NOTE — Addendum Note (Signed)
Addended by: Gerarda Fraction A on: 09/26/2020 01:41 PM   Modules accepted: Orders

## 2020-09-27 NOTE — Progress Notes (Signed)
Appt made for 12/23/20 for repeat A1c.

## 2020-09-27 NOTE — Progress Notes (Signed)
Noted labs scheduled 

## 2020-09-29 NOTE — Telephone Encounter (Signed)
Patient reported covid test negative and all symptoms have resolved.  Discussed using straw under mask for drinking at work this week.  Strict mask wear through day 10 7/29 may use meditation room, outside tables or car for eating lunch/snacks.  Avoid removing mask when around others.  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  Discussed with patient to talk with instructor for her to take 5-10 minutes to eat in meditation room on a break if lecture continuing during lunch.  Patient A&Ox3 spoke full sentences without difficulty no cough/congestion/throat clearing noted during 4 minute telephone call. Patient verbalized understanding information/instructions, agreed with plan of care and had no further questions at this time.  HR notified strict mask wear through day 10 and RTW 7/25 scheduled work day.

## 2020-09-30 NOTE — Telephone Encounter (Signed)
Reviewed RN Hildred Alamin note patient RTW as expected today and feeling well wearing mask through 10/04/20

## 2020-09-30 NOTE — Telephone Encounter (Signed)
Pt returned call and left mesg reporting that she did RTW today 7/25 as expected, feels well, all is fine, she is at workstation now, wearing mask.

## 2020-10-21 NOTE — Telephone Encounter (Signed)
Patient returned call feeling well no questions or concerns.  Enounter closed.

## 2021-02-17 ENCOUNTER — Other Ambulatory Visit: Payer: Self-pay | Admitting: *Deleted

## 2021-02-17 ENCOUNTER — Other Ambulatory Visit: Payer: Self-pay

## 2021-02-17 DIAGNOSIS — R7303 Prediabetes: Secondary | ICD-10-CM

## 2021-02-17 NOTE — Progress Notes (Signed)
Attempted A1c draw x1. Unsuccessful. Pt prefers to reschedule and attempt another day instead of 2nd draw. Rescheduled for 12/19.

## 2021-02-20 ENCOUNTER — Other Ambulatory Visit: Payer: Self-pay

## 2021-02-20 ENCOUNTER — Encounter: Payer: Self-pay | Admitting: Registered Nurse

## 2021-02-20 ENCOUNTER — Telehealth: Payer: Self-pay | Admitting: Registered Nurse

## 2021-02-20 VITALS — Temp 98.0°F | Resp 16

## 2021-02-20 DIAGNOSIS — J0101 Acute recurrent maxillary sinusitis: Secondary | ICD-10-CM

## 2021-02-20 MED ORDER — AMOXICILLIN 875 MG PO TABS: 875.0000 mg | ORAL_TABLET | Freq: Two times a day (BID) | ORAL | 0 refills | Status: AC

## 2021-02-20 MED ORDER — SALINE SPRAY 0.65 % NA SOLN: 2.0000 | NASAL | 0 refills | Status: DC

## 2021-02-20 NOTE — Progress Notes (Addendum)
Subjective:    Patient ID: Sandra Lara, female    DOB: Nov 03, 1960, 60 y.o.   MRN: [id number]  60y/o caucasian female established patient requested video visit for evaluation of sinus pain/drainage green/very colorful, feeling cold.  Has tried nasal saline, azelastine, triamcinolone nasal spray without relief of symptoms.  Denied known sick contacts.  Unsure if weather change worsening her symptoms/causing headache. Took tylenol for headache this am.  Has checked her temperature with thermometer and 98. Did home covid test one hour ago and was negative.  Denied dyspnea/shortness of breath/wheezing/fever/dysphagia/dysphasia.  Last amoxicillin use Jun 2022.    Virtual Visit via Video Note  I connected withEleanor Hiilani Lara on 02/20/21 at 12:40 PM EST by a video enabled telemedicine application and verified that I am speaking with the correct person using two identifiers.  Location: Patient: home Provider: [facility]  I discussed the limitations of evaluation and management by telemedicine. The patient expressed understanding and agreed to proceed.  Duration 4.37 minutes via video 100%.      Review of Systems  Constitutional:  Positive for chills. Negative for activity change, appetite change, diaphoresis, fatigue and fever.  HENT:  Positive for congestion, facial swelling, postnasal drip, rhinorrhea, sinus pressure, sinus pain and sneezing. Negative for dental problem, drooling, ear discharge, ear pain, hearing loss, mouth sores, nosebleeds, sore throat, tinnitus, trouble swallowing and voice change.   Eyes:  Negative for photophobia and visual disturbance.  Respiratory:  Positive for cough. Negative for choking, shortness of breath, wheezing and stridor.   Cardiovascular:  Negative for chest pain.  Gastrointestinal:  Negative for diarrhea, nausea and vomiting.  Endocrine: Negative for cold intolerance and heat intolerance.  Genitourinary:  Negative for difficulty  urinating.  Musculoskeletal:  Negative for back pain, gait problem, neck pain and neck stiffness.  Skin:  Negative for color change and rash.  Allergic/Immunologic: Positive for environmental allergies. Negative for food allergies.  Neurological:  Positive for headaches. Negative for dizziness, tremors, seizures, syncope, facial asymmetry, speech difficulty, weakness, light-headedness and numbness.  Hematological:  Negative for adenopathy.  Psychiatric/Behavioral:  Negative for agitation, confusion and sleep disturbance.       Objective:   Physical Exam Constitutional:      General: She is awake. She is not in acute distress.    Appearance: Normal appearance. She is well-developed and well-groomed. She is obese. She is not ill-appearing, toxic-appearing or diaphoretic.  HENT:     Head: Normocephalic and atraumatic.     Jaw: There is normal jaw occlusion.     Salivary Glands: Right salivary gland is not diffusely enlarged. Left salivary gland is not diffusely enlarged.     Right Ear: Hearing and external ear normal.     Left Ear: Hearing and external ear normal.     Nose: Congestion present.     Right Sinus: Maxillary sinus tenderness present. No frontal sinus tenderness.     Left Sinus: Maxillary sinus tenderness and frontal sinus tenderness present.     Comments: TTP left frontal; bilaterally maxillary and cheeks feel and look mildy swollen today; bilateral allergic shiners; nasal sniffing noted/audible during video visit    Mouth/Throat:     Lips: Pink. No lesions.     Mouth: Mucous membranes are moist. No lacerations, oral lesions or angioedema.     Dentition: No gum lesions.     Tongue: No lesions. Tongue does not deviate from midline.     Palate: No mass and lesions.  Pharynx: Oropharynx is clear. Uvula midline. No pharyngeal swelling, oropharyngeal exudate or posterior oropharyngeal erythema.     Tonsils: No tonsillar exudate.  Eyes:     General: Lids are normal. Vision  grossly intact. Gaze aligned appropriately. Allergic shiner present. No scleral icterus.       Right eye: No discharge.        Left eye: No discharge.     Extraocular Movements: Extraocular movements intact.     Conjunctiva/sclera: Conjunctivae normal.     Pupils: Pupils are equal, round, and reactive to light.  Neck:     Trachea: Trachea and phonation normal. No tracheal deviation.  Pulmonary:     Effort: Pulmonary effort is normal. No respiratory distress.     Breath sounds: Normal air entry. No stridor or transmitted upper airway sounds. No wheezing.     Comments: Spoke full sentences without difficulty; mild dry cough noted during video encounter and throat clearing Abdominal:     Palpations: Abdomen is soft.  Musculoskeletal:        General: No swelling or signs of injury. Normal range of motion.     Right hand: No swelling, deformity or lacerations. Normal range of motion. Normal strength.     Left hand: No swelling, deformity or lacerations. Normal range of motion. Normal strength.     Cervical back: Normal range of motion. No edema, erythema, rigidity or crepitus. No pain with movement. Normal range of motion.  Skin:    General: Skin is warm and dry.     Capillary Refill: Capillary refill takes less than 2 seconds.     Coloration: Skin is not ashen, cyanotic, jaundiced, mottled, pale or sallow.     Findings: No abrasion, acne, bruising, burn, ecchymosis, erythema, signs of injury, laceration, petechiae, rash or wound.     Nails: There is no clubbing.  Neurological:     General: No focal deficit present.     Mental Status: She is alert and oriented to person, place, and time. Mental status is at baseline.     Cranial Nerves: No cranial nerve deficit, dysarthria or facial asymmetry.     Motor: No weakness, tremor, atrophy or seizure activity.     Coordination: Coordination is intact. Coordination normal.     Comments: Patient seated in chair for video visit gait not observed;  bilateral arms normal AROM/holding her phone and performing exams as indicated  Psychiatric:        Attention and Perception: Attention and perception normal.        Mood and Affect: Mood and affect normal.        Behavior: Behavior normal. Behavior is cooperative.        Thought Content: Thought content normal.        Cognition and Memory: Cognition and memory normal.        Judgment: Judgment normal.          Assessment & Plan:   A-recurrent maxillary and frontal sinusitis  P-Continue triamcinolone nasal spray 1 spray each nostril BID, saline 2 sprays each nostril q2h wa prn congestion.  Start amoxicillin 875mg  po BID x 10 days #20 RF0 electronic Rx sent to her pharmacy of choice  Denied personal or family history of ENT cancer.  Shower BID especially prior to bed. No evidence of systemic bacterial infection, non toxic and well hydrated.  I do not see where any further testing or imaging is necessary at this time.   I will suggest supportive care, rest, good  hygiene and encourage the patient to take adequate fluids.  The patient is to return to clinic or EMERGENCY ROOM if symptoms worsen or change significantly.  Exitcare handouts on sinus headache, sinusitis and sinus rinse.  If new or worsening symptoms after 24 hours of amoxicillin patient to repeat covid test and stay home/contact clinic staff.  Patient may return onsite tomorrow as long as symptoms improving and no fever/vomiting/diarrhea this afternoon/overnight.  Patient verbalized agreement and understanding of treatment plan and had no further questions at this time.   P2:  Hand washing and cover cough   I discussed the assessment and treatment plan with the patient. The patient was provided an opportunity to ask questions and all were answered. The patient agreed with the plan and demonstrated an understanding of the instructions.   The patient was advised to call back or seek an in-person evaluation if the symptoms worsen or if  the condition fails to improve as anticipated.  I provided 10 minutes of non-face-to-face time during this encounter.   Olen Cordial, NP

## 2021-02-20 NOTE — Patient Instructions (Signed)
How to Perform a Sinus Rinse A sinus rinse is a home treatment that is used to rinse your sinuses with a germ-free (sterile) mixture of salt and water (saline solution). Sinuses are air-filled spaces in your skull that are behind the bones of your face and forehead. They open into your nasal cavity. A sinus rinse can help to clear mucus, dirt, dust, or pollen from your nasal cavity. You may do a sinus rinse when you have a cold, a virus, nasal allergy symptoms, a sinus infection, or stuffiness in your nose or sinuses. What are the risks? A sinus rinse is generally safe and effective. However, there are a few risks, which include: A burning sensation in your sinuses. This may happen if you do not make the saline solution as directed. Be sure to follow all directions when making the saline solution. Nasal irritation. Infection. This may be from unclean supplies or from contaminated water. Infection from contaminated water is rare, but possible. Do not do a sinus rinse if you have had ear or nasal surgery, ear infection, or plugged ears, unless recommended by your health care provider. Supplies needed: Saline solution or powder. Distilled or sterile water to mix with saline powder. You may use boiled and cooled tap water. Boil tap water for 5 minutes; cool until it is lukewarm. Use within 24 hours. Do not use regular tap water to mix with the saline solution. Neti pot or nasal rinse bottle. These supplies release the saline solution into your nose and through your sinuses. Neti pots and nasal rinse bottles can be purchased at [employer], a health food store, or online. How to perform a sinus rinse  Wash your hands with soap and water for at least 20 seconds. If soap and water are not available, use hand sanitizer. Wash your device according to the directions that came with the product and then dry it. Use the solution that comes with your product or one that is sold separately in stores.  Follow the mixing directions on the package to mix with sterile or distilled water. Fill the device with the amount of saline solution noted in the device instructions. Stand by a sink and tilt your head sideways over the sink. Place the spout of the device in your upper nostril (the one closer to the ceiling). Gently pour or squeeze the saline solution into your nasal cavity. The liquid should drain out from the lower nostril if you are not too congested. While rinsing, breathe through your open mouth. Gently blow your nose to clear any mucus and rinse solution. Blowing too hard may cause ear pain. Turn your head in the other direction and repeat in your other nostril. Clean and rinse your device with clean water and then air-dry it. Talk with your health care provider or pharmacist if you have questions about how to do a sinus rinse. Summary A sinus rinse is a home treatment that is used to rinse your sinuses with a sterile mixture of salt and water (saline solution). You may do a sinus rinse when you have a cold, a virus, nasal allergy symptoms, a sinus infection, or stuffiness in your nose or sinuses. A sinus rinse is generally safe and effective. Follow all instructions carefully. This information is not intended to replace advice given to you by your health care provider. Make sure you discuss any questions you have with your health care provider. Document Revised: 08/12/2020 Document Reviewed: 08/12/2020 Elsevier Patient Education  [place]. Sinusitis,  Adult Sinusitis is inflammation of your sinuses. Sinuses are hollow spaces in the bones around your face. Your sinuses are located: Around your eyes. In the middle of your forehead. Behind your nose. In your cheekbones. Mucus normally drains out of your sinuses. When your nasal tissues become inflamed or swollen, mucus can become trapped or blocked. This allows bacteria, viruses, and fungi to grow, which leads to infection. Most  infections of the sinuses are caused by a virus. Sinusitis can develop quickly. It can last for up to 4 weeks (acute) or for more than 12 weeks (chronic). Sinusitis often develops after a cold. What are the causes? This condition is caused by anything that creates swelling in the sinuses or stops mucus from draining. This includes: Allergies. Asthma. Infection from bacteria or viruses. Deformities or blockages in your nose or sinuses. Abnormal growths in the nose (nasal polyps). Pollutants, such as chemicals or irritants in the air. Infection from fungi (rare). What increases the risk? You are more likely to develop this condition if you: Have a weak body defense system (immune system). Do a lot of swimming or diving. Overuse nasal sprays. Smoke. What are the signs or symptoms? The main symptoms of this condition are pain and a feeling of pressure around the affected sinuses. Other symptoms include: Stuffy nose or congestion. Thick drainage from your nose. Swelling and warmth over the affected sinuses. Headache. Upper toothache. A cough that may get worse at night. Extra mucus that collects in the throat or the back of the nose (postnasal drip). Decreased sense of smell and taste. Fatigue. A fever. Sore throat. Bad breath. How is this diagnosed? This condition is diagnosed based on: Your symptoms. Your medical history. A physical exam. Tests to find out if your condition is acute or chronic. This may include: Checking your nose for nasal polyps. Viewing your sinuses using a device that has a light (endoscope). Testing for allergies or bacteria. Imaging tests, such as an MRI or CT scan. In rare cases, a bone biopsy may be done to rule out more serious types of fungal sinus disease. How is this treated? Treatment for sinusitis depends on the cause and whether your condition is chronic or acute. If caused by a virus, your symptoms should go away on their own within 10 days.  You may be given medicines to relieve symptoms. They include: Medicines that shrink swollen nasal passages (topical intranasal decongestants). Medicines that treat allergies (antihistamines). A spray that eases inflammation of the nostrils (topical intranasal corticosteroids). Rinses that help get rid of thick mucus in your nose (nasal saline washes). If caused by bacteria, your health care provider may recommend waiting to see if your symptoms improve. Most bacterial infections will get better without antibiotic medicine. You may be given antibiotics if you have: A severe infection. A weak immune system. If caused by narrow nasal passages or nasal polyps, you may need to have surgery. Follow these instructions at home: Medicines Take, use, or apply over-the-counter and prescription medicines only as told by your health care provider. These may include nasal sprays. If you were prescribed an antibiotic medicine, take it as told by your health care provider. Do not stop taking the antibiotic even if you start to feel better. Hydrate and humidify  Drink enough fluid to keep your urine pale yellow. Staying hydrated will help to thin your mucus. Use a cool mist humidifier to keep the humidity level in your home above 50%. Inhale steam for 10-15 minutes, 3-4 times  a day, or as told by your health care provider. You can do this in the bathroom while a hot shower is running. Limit your exposure to cool or dry air. Rest Rest as much as possible. Sleep with your head raised (elevated). Make sure you get enough sleep each night. General instructions  Apply a warm, moist washcloth to your face 3-4 times a day or as told by your health care provider. This will help with discomfort. Wash your hands often with soap and water to reduce your exposure to germs. If soap and water are not available, use hand sanitizer. Do not smoke. Avoid being around people who are smoking (secondhand smoke). Keep all  follow-up visits as told by your health care provider. This is important. Contact a health care provider if: You have a fever. Your symptoms get worse. Your symptoms do not improve within 10 days. Get help right away if: You have a severe headache. You have persistent vomiting. You have severe pain or swelling around your face or eyes. You have vision problems. You develop confusion. Your neck is stiff. You have trouble breathing. Summary Sinusitis is soreness and inflammation of your sinuses. Sinuses are hollow spaces in the bones around your face. This condition is caused by nasal tissues that become inflamed or swollen. The swelling traps or blocks the flow of mucus. This allows bacteria, viruses, and fungi to grow, which leads to infection. If you were prescribed an antibiotic medicine, take it as told by your health care provider. Do not stop taking the antibiotic even if you start to feel better. Keep all follow-up visits as told by your health care provider. This is important. This information is not intended to replace advice given to you by your health care provider. Make sure you discuss any questions you have with your health care provider. Document Revised: 07/26/2017 Document Reviewed: 07/26/2017 Elsevier Patient Education  2022 [place]. Sinus Headache A sinus headache occurs when your sinuses become clogged or swollen. Sinuses are air-filled spaces in your skull that are behind the bones of your face and forehead. Sinus headaches can range from mild to severe. What are the causes? A sinus headache can result from various conditions that affect the sinuses. Common causes include: Colds. Sinus infections. Allergies. Many people confuse sinus headaches with migraines or tension headaches because both headaches can cause facial pain and nasal symptoms. What are the signs or symptoms? The main symptom of this condition is a headache that may feel like pain or pressure in  your face, forehead, ears, or upper teeth. People who have a sinus headache often have other symptoms, such as: Congested or runny nose. Fever. Inability to smell. Weather changes can make symptoms worse. How is this diagnosed? This condition may be diagnosed based on: A physical exam and medical history. Imaging tests, such as a CT scan or MRI, to check for problems with the sinuses. Examination of the sinuses using a thin tool with a camera that is inserted through your nose (endoscopy). How is this treated? Treatment for this condition depends on the cause. Sinus pain that is caused by a sinus infection may be treated with antibiotic medicine. Sinus pain that is caused by congestion may be helped by rinsing out (flushing) the nose and sinuses with saline solution. Sinus pain that is caused by allergies may be helped by allergy medicines (antihistamines) and medicated nasal sprays. Sinus surgery may be needed in some cases if other treatments do not help. Follow these  instructions at home: General instructions If directed: Apply a warm, moist washcloth to your face to help relieve pain. Use a nasal saline wash. Hydrate and humidify Drink enough water to keep your urine clear or pale yellow. Staying hydrated will help to thin your mucus. Use a cool mist humidifier to keep the humidity level in your home above 50%. Inhale steam for 10-15 minutes, 3-4 times a day or as told by your health care provider. You can do this in the bathroom while a hot shower is running. Limit your exposure to cool or dry air. Medicines  Take over-the-counter and prescription medicines only as told by your health care provider. If you were prescribed an antibiotic medicine, take it as told by your health care provider. Do not stop taking the antibiotic even if you start to feel better. If you have congestion, use a nasal spray to help lessen pressure. Contact a health care provider if: You have a headache  more than one time a week. You have sensitivity to light or sound. You develop a fever. You feel nauseous or you vomit. Your headaches do not get better with treatment. Many people think that they have a sinus headache when they actually have a migraine or a tension headache. Get help right away if: You have vision problems. You have sudden, severe pain in your face or head. You have a seizure. You are confused. You have a stiff neck. Summary A sinus headache occurs when your sinuses become clogged or swollen. A sinus headache can result from various conditions that affect the sinuses, such as a cold, a sinus infection, or an allergy. Treatment for this condition depends on the cause. It may include medicine, such as antibiotics or antihistamines. This information is not intended to replace advice given to you by your health care provider. Make sure you discuss any questions you have with your health care provider. Document Revised: 12/01/2019 Document Reviewed: 12/05/2019 Elsevier Patient Education  2022 [employer].

## 2021-03-31 ENCOUNTER — Other Ambulatory Visit: Payer: Self-pay | Admitting: *Deleted

## 2021-03-31 ENCOUNTER — Other Ambulatory Visit: Payer: Self-pay

## 2021-03-31 DIAGNOSIS — Z6835 Body mass index (BMI) 35.0-35.9, adult: Secondary | ICD-10-CM

## 2021-03-31 DIAGNOSIS — Z0000 Encounter for general adult medical examination without abnormal findings: Secondary | ICD-10-CM

## 2021-03-31 DIAGNOSIS — R7303 Prediabetes: Secondary | ICD-10-CM

## 2021-03-31 DIAGNOSIS — K219 Gastro-esophageal reflux disease without esophagitis: Secondary | ICD-10-CM

## 2021-03-31 NOTE — Progress Notes (Signed)
Recheck labs from July 2022 and scheduled for full [name] panel since due soon.

## 2021-04-01 ENCOUNTER — Encounter: Payer: Self-pay | Admitting: Registered Nurse

## 2021-04-01 LAB — MAGNESIUM: Magnesium: 2.2 mg/dL (ref 1.6–2.3)

## 2021-04-01 LAB — CMP12+LP+TP+TSH+6AC+CBC/D/PLT
ALT: 18 IU/L (ref 0–32)
AST: 16 IU/L (ref 0–40)
Albumin/Globulin Ratio: 3 — ABNORMAL HIGH (ref 1.2–2.2)
Albumin: 4.8 g/dL (ref 3.8–4.9)
Alkaline Phosphatase: 85 IU/L (ref 44–121)
BUN/Creatinine Ratio: 21 (ref 12–28)
BUN: 14 mg/dL (ref 8–27)
Basophils Absolute: 0.1 10*3/uL (ref 0.0–0.2)
Basos: 1 %
Bilirubin Total: 0.3 mg/dL (ref 0.0–1.2)
Calcium: 9.6 mg/dL (ref 8.7–10.3)
Chloride: 102 mmol/L (ref 96–106)
Chol/HDL Ratio: 3.4 ratio (ref 0.0–4.4)
Cholesterol, Total: 216 mg/dL — ABNORMAL HIGH (ref 100–199)
Creatinine, Ser: 0.68 mg/dL (ref 0.57–1.00)
EOS (ABSOLUTE): 0.1 10*3/uL (ref 0.0–0.4)
Eos: 2 %
Estimated CHD Risk: 0.5 times avg. (ref 0.0–1.0)
Free Thyroxine Index: 1.9 (ref 1.2–4.9)
GGT: 19 IU/L (ref 0–60)
Globulin, Total: 1.6 g/dL (ref 1.5–4.5)
Glucose: 105 mg/dL — ABNORMAL HIGH (ref 70–99)
HDL: 64 mg/dL (ref >39)
Hematocrit: 44.1 % (ref 34.0–46.6)
Hemoglobin: 14.3 g/dL (ref 11.1–15.9)
Immature Grans (Abs): 0 10*3/uL (ref 0.0–0.1)
Immature Granulocytes: 0 %
Iron: 87 ug/dL (ref 27–159)
LDH: 171 IU/L (ref 119–226)
LDL Chol Calc (NIH): 133 mg/dL — ABNORMAL HIGH (ref 0–99)
Lymphocytes Absolute: 1.3 10*3/uL (ref 0.7–3.1)
Lymphs: 23 %
MCH: 30 pg (ref 26.6–33.0)
MCHC: 32.4 g/dL (ref 31.5–35.7)
MCV: 93 fL (ref 79–97)
Monocytes Absolute: 0.4 10*3/uL (ref 0.1–0.9)
Monocytes: 7 %
Neutrophils Absolute: 3.9 10*3/uL (ref 1.4–7.0)
Neutrophils: 67 %
Phosphorus: 3.4 mg/dL (ref 3.0–4.3)
Platelets: 249 10*3/uL (ref 150–450)
Potassium: 4.4 mmol/L (ref 3.5–5.2)
RBC: 4.76 x10E6/uL (ref 3.77–5.28)
RDW: 13.6 % (ref 11.7–15.4)
Sodium: 142 mmol/L (ref 134–144)
T3 Uptake Ratio: 25 % (ref 24–39)
T4, Total: 7.7 ug/dL (ref 4.5–12.0)
TSH: 0.766 u[IU]/mL (ref 0.450–4.500)
Total Protein: 6.4 g/dL (ref 6.0–8.5)
Triglycerides: 107 mg/dL (ref 0–149)
Uric Acid: 4.2 mg/dL (ref 3.0–7.2)
VLDL Cholesterol Cal: 19 mg/dL (ref 5–40)
WBC: 5.8 10*3/uL (ref 3.4–10.8)
eGFR: 100 mL/min/{1.73_m2} (ref >59)

## 2021-04-01 LAB — VITAMIN D 25 HYDROXY (VIT D DEFICIENCY, FRACTURES): Vit D, 25-Hydroxy: 39.3 ng/mL (ref 30.0–100.0)

## 2021-04-01 LAB — HGB A1C W/O EAG: Hgb A1c MFr Bld: 5.9 % — ABNORMAL HIGH (ref 4.8–5.6)

## 2021-08-01 ENCOUNTER — Ambulatory Visit: Payer: No Typology Code available for payment source | Admitting: *Deleted

## 2021-08-01 VITALS — BP 135/89 | HR 68 | Ht <= 58 in | Wt 189.0 lb

## 2021-08-01 DIAGNOSIS — Z0000 Encounter for general adult medical examination without abnormal findings: Secondary | ICD-10-CM

## 2021-08-01 NOTE — Progress Notes (Signed)
[name] insurance premium discount evaluation: Labs Drawn Jan 2023. [facility] ROI form signed. Tobacco Free Attestation form signed.  Forms placed in paper chart.

## 2021-10-14 ENCOUNTER — Telehealth: Payer: Self-pay | Admitting: Registered Nurse

## 2021-10-14 DIAGNOSIS — L209 Atopic dermatitis, unspecified: Secondary | ICD-10-CM

## 2021-10-14 MED ORDER — TACROLIMUS 0.1 % EX CREA: 1.0000 | TOPICAL_CREAM | Freq: Two times a day (BID) | CUTANEOUS | 1 refills | Status: DC | PRN

## 2021-10-26 ENCOUNTER — Encounter: Payer: Self-pay | Admitting: Registered Nurse

## 2021-10-26 NOTE — Telephone Encounter (Signed)
Patient requested refill tacrolimus 0.1% cream working well for her dermatitis but has run out for prn use.  Requested larger size tube if possible.  Last filled 05/28/20 per Epic.   Electronic Rx sent to her pharmacy of choice tacrolimus 0.1% cream apply BID prn #30 g RF1 sent to her pharmacy of choice  Patient notified rx sent 10/14/21 for same size tube with 1 refill.  No further questions or concerns at that time.

## 2021-11-28 ENCOUNTER — Ambulatory Visit: Payer: No Typology Code available for payment source

## 2021-12-01 ENCOUNTER — Encounter: Payer: Self-pay | Admitting: *Deleted

## 2021-12-09 ENCOUNTER — Telehealth: Payer: Self-pay | Admitting: Registered Nurse

## 2021-12-09 ENCOUNTER — Encounter: Payer: Self-pay | Admitting: Registered Nurse

## 2021-12-09 NOTE — Telephone Encounter (Signed)
Patient left message for NP "I  spoke with our nurse yesterday about a concern that I have with the mobile mammogram tomorrow.  I was injured the last time I had a mammogram (which was the mobile unit at work on 11/19/2017) and sustained significant tearing under both breasts.  I am wanting to know if they will be using [employer] Protective Coverlets which aid in reducing the risk of tearing the tissue.  I was left bleeding and couldn't wear a bra for over 2 weeks due to this experience.  Our nurse stated that Otila Kluver may be able to find out if they will have these available when conducting mammograms at [facility].  I haven't had a mammogram in 4 years, this was rather traumatic for me, so I'm trying to find out ahead of tomorrow if they will be able to use a coverlet on the plate.  I am working remotely today, so I wanted to email you as I won't be answering my desk phone."

## 2021-12-11 LAB — HM MAMMOGRAPHY

## 2021-12-13 NOTE — Telephone Encounter (Signed)
Mobile mammogram unit cancelled by employer and rescheduled to Jan 2024 and patient did not have mammogram

## 2021-12-23 ENCOUNTER — Encounter: Payer: Self-pay | Admitting: Family Medicine

## 2021-12-28 ENCOUNTER — Encounter: Payer: Self-pay | Admitting: Family Medicine

## 2021-12-29 ENCOUNTER — Telehealth: Payer: Self-pay | Admitting: Registered Nurse

## 2021-12-29 ENCOUNTER — Encounter: Payer: Self-pay | Admitting: Registered Nurse

## 2021-12-29 DIAGNOSIS — Z23 Encounter for immunization: Secondary | ICD-10-CM

## 2021-12-29 NOTE — Telephone Encounter (Signed)
Patient sent NP message  I'm 61, and I'd like to get the Shingrix vaccine.  The problem is, my mother passed away 4 years ago this coming April, and I don't know if I had the chickenpox when I was a child.  Can I get a blood draw here to be sent to [facility] to test for that?  If I didn't have chickenpox, then it's my understanding I need to be vaccinated for both Shingles and Chickenpox.  Is that correct?     Thank you for your help with this.

## 2021-12-29 NOTE — Telephone Encounter (Signed)
Sandra Lara,     We do not have chickenpox vaccine I recommend follow up with your [facility] for lab to see if you have antibodies and then counseling if vaccine needed since you are unsure if you have ever had chickenpox.     We could test blood here for chickenpox antibodies but not recommended by ACIP or CDC prior to shingrix vaccination.     If negative for chickenpox antibodies it is recommended you receive chickenpox vaccine      Sincerely,     Gerarda Fraction NP-C

## 2022-01-02 ENCOUNTER — Other Ambulatory Visit: Payer: No Typology Code available for payment source

## 2022-01-02 DIAGNOSIS — Z23 Encounter for immunization: Secondary | ICD-10-CM

## 2022-01-02 NOTE — Progress Notes (Signed)
Attempted lab draw x 3 per patient request.  Unsuccessful.  Patient to hydrate over the weekend and to attempt lab draw on Monday.

## 2022-01-05 ENCOUNTER — Other Ambulatory Visit: Payer: Self-pay | Admitting: Registered Nurse

## 2022-01-06 ENCOUNTER — Telehealth: Payer: Self-pay | Admitting: Registered Nurse

## 2022-01-06 DIAGNOSIS — Z23 Encounter for immunization: Secondary | ICD-10-CM

## 2022-01-06 DIAGNOSIS — Z8619 Personal history of other infectious and parasitic diseases: Secondary | ICD-10-CM

## 2022-01-06 LAB — VARICELLA ZOSTER ANTIBODY, IGG: Varicella zoster IgG: 539 index (ref >165)

## 2022-01-06 NOTE — Telephone Encounter (Signed)
Patient had varicella lab drawn with RN Joaquim Lai and results positive see tcon dated 01/06/22 shingrix vaccination recommended and available at [facility] clinic.

## 2022-01-06 NOTE — Telephone Encounter (Signed)
Results reviewed in [facility] portal positive for history of varicella infection.  Left message for patient that I do recommend shingrix vaccination and it is available at [facility] clinic.  [employer] handout Zoster vaccine information sent to patient my chart   Varicella Zoster IgG  539                                               Negative          <135                                               Equivocal    135 - 165                                               Positive          >165                A positive result generally indicates exposure to the                pathogen or administration of specific immunoglobulins,                but it is not indication of active infection or stage                of disease.

## 2022-02-09 ENCOUNTER — Ambulatory Visit: Payer: No Typology Code available for payment source | Admitting: Occupational Medicine

## 2022-02-09 VITALS — BP 130/80 | HR 82 | Temp 98.7°F

## 2022-02-09 DIAGNOSIS — J069 Acute upper respiratory infection, unspecified: Secondary | ICD-10-CM

## 2022-02-09 NOTE — Progress Notes (Signed)
Patient reports having Covid and RSV vaccine last Tuesday. This weekend thru today feeling hot and cold like fever. Also Blowing out green chunks of mucous. Hearing ears pop as well. Covid Negative. Given Saline Spray sudanyl pe and loratadine. Schedule appt with [facility] in am.

## 2022-02-10 ENCOUNTER — Ambulatory Visit: Payer: No Typology Code available for payment source | Admitting: Registered Nurse

## 2022-02-11 ENCOUNTER — Telehealth: Payer: Self-pay | Admitting: Registered Nurse

## 2022-02-11 ENCOUNTER — Encounter: Payer: Self-pay | Admitting: Registered Nurse

## 2022-02-11 NOTE — Telephone Encounter (Signed)
Patient scheduled for 21 Dec shingrix vaccination with RN Evlyn Kanner.  Epic reviewed.  Patient on antibiotics ensure infection cleared/afebrile.  Last vaccine covid and RSV 02/03/22.  CDC and up to date reviewed regarding timing between Covid/RSV and shingrix vaccines.

## 2022-02-12 NOTE — Telephone Encounter (Signed)
Patient vaccination scheduled with RN Evlyn Kanner for 26 Feb 2022.

## 2022-02-13 NOTE — Telephone Encounter (Signed)
Noted mammogram completed at different location.

## 2022-02-13 NOTE — Telephone Encounter (Signed)
Patient notified RN Evlyn Kanner she is going to get vaccine shingrix at pharmacy near her home instead now  appt canceled with [facility]

## 2022-02-19 ENCOUNTER — Encounter: Payer: Self-pay | Admitting: *Deleted

## 2022-03-30 ENCOUNTER — Encounter: Payer: No Typology Code available for payment source | Admitting: Family Medicine

## 2022-04-25 ENCOUNTER — Other Ambulatory Visit: Payer: Self-pay | Admitting: Registered Nurse

## 2022-04-25 DIAGNOSIS — L209 Atopic dermatitis, unspecified: Secondary | ICD-10-CM

## 2022-04-27 NOTE — Telephone Encounter (Unsigned)
Patient left message needs refill of tacrolimus.  Electronic Rx sent to her pharmacy of choice.

## 2022-05-27 ENCOUNTER — Telehealth: Payer: Self-pay | Admitting: Registered Nurse

## 2022-05-27 ENCOUNTER — Encounter: Payer: Self-pay | Admitting: Registered Nurse

## 2022-05-27 DIAGNOSIS — Z0000 Encounter for general adult medical examination without abnormal findings: Secondary | ICD-10-CM

## 2022-05-27 NOTE — Telephone Encounter (Signed)
Overdue [name] 2025 appt last Jan 2023 labs ordered executive panel, Hgba1c, magnesium and vitamin D please schedule fasting

## 2022-06-02 NOTE — Telephone Encounter (Signed)
noted 

## 2022-06-30 ENCOUNTER — Ambulatory Visit (INDEPENDENT_AMBULATORY_CARE_PROVIDER_SITE_OTHER): Payer: No Typology Code available for payment source | Admitting: Family Medicine

## 2022-06-30 ENCOUNTER — Encounter: Payer: Self-pay | Admitting: Family Medicine

## 2022-06-30 VITALS — BP 134/82 | HR 64 | Temp 98.3°F | Ht <= 58 in | Wt 194.8 lb

## 2022-06-30 DIAGNOSIS — D126 Benign neoplasm of colon, unspecified: Secondary | ICD-10-CM

## 2022-06-30 DIAGNOSIS — J301 Allergic rhinitis due to pollen: Secondary | ICD-10-CM | POA: Diagnosis not present

## 2022-06-30 DIAGNOSIS — L301 Dyshidrosis [pompholyx]: Secondary | ICD-10-CM

## 2022-06-30 DIAGNOSIS — Z0001 Encounter for general adult medical examination with abnormal findings: Secondary | ICD-10-CM

## 2022-06-30 DIAGNOSIS — Z23 Encounter for immunization: Secondary | ICD-10-CM | POA: Diagnosis not present

## 2022-06-30 DIAGNOSIS — K219 Gastro-esophageal reflux disease without esophagitis: Secondary | ICD-10-CM | POA: Diagnosis not present

## 2022-06-30 DIAGNOSIS — R1011 Right upper quadrant pain: Secondary | ICD-10-CM

## 2022-06-30 DIAGNOSIS — Z0000 Encounter for general adult medical examination without abnormal findings: Secondary | ICD-10-CM

## 2022-06-30 LAB — TSH: TSH: 1.31 u[IU]/mL (ref 0.35–5.50)

## 2022-06-30 LAB — LIPID PANEL
Cholesterol: 224 mg/dL — ABNORMAL HIGH (ref 0–200)
HDL: 62.7 mg/dL (ref >39.00)
LDL Cholesterol: 138 mg/dL — ABNORMAL HIGH (ref 0–99)
NonHDL: 160.96
Total CHOL/HDL Ratio: 4
Triglycerides: 117 mg/dL (ref 0.0–149.0)
VLDL: 23.4 mg/dL (ref 0.0–40.0)

## 2022-06-30 LAB — CBC WITH DIFFERENTIAL/PLATELET
Basophils Absolute: 0.1 10*3/uL (ref 0.0–0.1)
Basophils Relative: 1 % (ref 0.0–3.0)
Eosinophils Absolute: 0.1 10*3/uL (ref 0.0–0.7)
Eosinophils Relative: 1.9 % (ref 0.0–5.0)
HCT: 44.2 % (ref 36.0–46.0)
Hemoglobin: 14.9 g/dL (ref 12.0–15.0)
Lymphocytes Relative: 29.2 % (ref 12.0–46.0)
Lymphs Abs: 1.5 10*3/uL (ref 0.7–4.0)
MCHC: 33.7 g/dL (ref 30.0–36.0)
MCV: 90.9 fl (ref 78.0–100.0)
Monocytes Absolute: 0.5 10*3/uL (ref 0.1–1.0)
Monocytes Relative: 9.3 % (ref 3.0–12.0)
Neutro Abs: 2.9 10*3/uL (ref 1.4–7.7)
Neutrophils Relative %: 58.6 % (ref 43.0–77.0)
Platelets: 271 10*3/uL (ref 150.0–400.0)
RBC: 4.86 Mil/uL (ref 3.87–5.11)
RDW: 14.5 % (ref 11.5–15.5)
WBC: 5 10*3/uL (ref 4.0–10.5)

## 2022-06-30 LAB — COMPREHENSIVE METABOLIC PANEL
ALT: 17 U/L (ref 0–35)
AST: 16 U/L (ref 0–37)
Albumin: 4.6 g/dL (ref 3.5–5.2)
Alkaline Phosphatase: 78 U/L (ref 39–117)
BUN: 13 mg/dL (ref 6–23)
CO2: 24 mEq/L (ref 19–32)
Calcium: 9.9 mg/dL (ref 8.4–10.5)
Chloride: 105 mEq/L (ref 96–112)
Creatinine, Ser: 0.68 mg/dL (ref 0.40–1.20)
GFR: 93.73 mL/min (ref >60.00)
Glucose, Bld: 96 mg/dL (ref 70–99)
Potassium: 4.1 mEq/L (ref 3.5–5.1)
Sodium: 140 mEq/L (ref 135–145)
Total Bilirubin: 0.5 mg/dL (ref 0.2–1.2)
Total Protein: 6.7 g/dL (ref 6.0–8.3)

## 2022-06-30 MED ORDER — CLOTRIMAZOLE-BETAMETHASONE 1-0.05 % EX CREA: 1.0000 | TOPICAL_CREAM | Freq: Two times a day (BID) | CUTANEOUS | 1 refills | Status: DC

## 2022-06-30 NOTE — Progress Notes (Signed)
Subjective  Chief Complaint  Patient presents with   Annual Exam    Pt here for Annual Exam and is currently fasting     HPI: Sandra Lara is a 62 y.o. female who presents to [facility] [place] today for a Female Wellness Visit. She also has the concerns and/or needs as listed above in the chief complaint. These will be addressed in addition to the Health Maintenance Visit.  Last visit with me November 2020  Wellness Visit: annual visit with health maintenance review and exam without Pap  Health maintenance: Here to reestablish and for annual exam.  Has an appointment with GYN for female wellness upcoming.  Eligible for Shingrix vaccinations.  Has been receiving her health care with her employer's employee wellness program. Chronic disease f/u and/or acute problem visit: (deemed necessary to be done in addition to the wellness visit): History of tubular adenoma of the colon: She reports she had her most recent colonoscopy within the last 2 or 3 years.  We will call for results.  She believes they were stable and is now on a q. 10-year recall schedule. GERD is well-controlled on omeprazole 20 mg daily. Continues on allergy medicines. Morbid obesity: She had lost significant weight over the last several years but has regained it.  Working now on better diet and exercise again.  No symptoms of hypoglycemia but has had some elevated A1c's. Complains of eczematous changes on her hands and elbows.  Has been treated by her prior doctor with tacrolimus and betamethasone.  Better but persist.  Itching at times. Complains of right upper quadrant pain described as feeling like her rib cage is being pushed outward.  Intermittent over the last month.  No clear pattern.  Not necessarily postprandial.  Also admits to some gassy distention.  No real cramping, no melena lower abdominal pain, nausea or vomiting.  Denies fevers or chills.  Denies constipation  Assessment  1. Annual  physical exam   2. Tubular adenoma of colon   3. Gastroesophageal reflux disease without esophagitis   4. Seasonal allergic rhinitis due to pollen   5. Need for shingles vaccine   6. Morbid obesity   7. Dyshidrotic eczema   8. RUQ pain      Plan  Female Wellness Visit: Age appropriate Health Maintenance and Prevention measures were discussed with patient. Included topics are cancer screening recommendations, ways to keep healthy (see AVS) including dietary and exercise recommendations, regular eye and dental care, use of seat belts, and avoidance of moderate alcohol use and tobacco use.  Pap smear to be done by GYN soon.  Other screens are current.  Will get colonoscopy records BMI: discussed patient's BMI and encouraged positive lifestyle modifications to help get to or maintain a target BMI. HM needs and immunizations were addressed and ordered. See below for orders. See HM and immunization section for updates.  Shingrix, first given today.  Return in 6 months for second dose.  Education given Routine labs and screening tests ordered including cmp, cbc and lipids where appropriate. Discussed recommendations regarding Vit D and calcium supplementation (see AVS)  Chronic disease management visit and/or acute problem visit: History of tubular adenomas: Will follow-up GERD: Well-controlled on omeprazole 20 daily Seasonal allergies well-controlled on Flonase Morbid obesity: Discussed diet and exercise.  Patient to follow-up if needs more help. Possible dyshidrotic eczema versus secondary tinea infection: Trial of Lotrisone twice daily Right upper quadrant pain: Unclear cause, check lab work and right  upper quadrant ultrasound.  Benign exam today.  Follow up: 12 months for complete physical, sooner if needed for other problems Orders Placed This Encounter  Procedures   US ABDOMEN LIMITED RUQ (LIVER/GB)   Zoster Recombinant (Shingrix )   Comprehensive metabolic panel   CBC with  Differential/Platelet   Lipid panel   TSH   Hemoglobin A1c   Meds ordered this encounter  Medications   clotrimazole-betamethasone (LOTRISONE) cream    Sig: Apply 1 Application topically 2 (two) times daily.    Dispense:  45 g    Refill:  1      Body mass index is 40.71 kg/m. Wt Readings from Last 3 Encounters:  06/30/22 194 lb 12.8 oz (88.4 kg)  08/01/21 189 lb (85.7 kg)  05/28/20 169 lb (76.7 kg)     Patient Active Problem List   Diagnosis Date Noted   DJD (degenerative joint disease), lumbar 08/26/2017    Priority: Medium     Xray 2017    Tubular adenoma of colon 11/26/2015    Priority: Medium     Repeat q 5 years. Next 2022     Diverticulosis of large intestine 05/24/2015    Priority: Medium     Colonoscopy - has had recurrent diverticulitis x 3 in 2016     Gastroesophageal reflux disease without esophagitis 09/28/2014    Priority: Medium    Greater trochanteric bursitis of left hip 09/28/2014    Priority: Medium    Menopause syndrome 09/28/2014    Priority: Medium     Delrae Rend MD - [facility]     Seasonal allergic rhinitis due to pollen 05/21/2016    Priority: Low   Dyshidrotic eczema 06/30/2022   Morbid obesity 10/29/2016   Health Maintenance  Topic Date Due   COVID-19 Vaccine (2 - 2023-24 season) 07/16/2022 (Originally 03/31/2022)   PAP SMEAR-Modifier  07/18/2022 (Originally 05/23/2020)   Zoster Vaccines- Shingrix (2 of 2) 08/25/2022   INFLUENZA VACCINE  10/08/2022   MAMMOGRAM  12/12/2022   DTaP/Tdap/Td (2 - Td or Tdap) 09/27/2024   COLONOSCOPY (Pts 45-77yrs Insurance coverage will need to be confirmed)  11/21/2025   Hepatitis C Screening  Completed   HIV Screening  Completed   HPV VACCINES  Aged Out   Immunization History  Administered Date(s) Administered   Influenza,inj,Quad PF,6+ Mos 03/19/2016, 12/14/2017, 01/23/2019, 12/10/2020   Influenza-Unspecified 12/26/2019, 11/28/2021   Pfizer Covid-19 Vaccine Bivalent Booster  58yrs & up 02/03/2022   RSV,unspecified 02/03/2022   Tdap 09/28/2014   Zoster Recombinat (Shingrix) 06/30/2022   We updated and reviewed the patient's past history in detail and it is documented below. Allergies: Patient is allergic to sulfamethoxazole-trimethoprim, doxycycline, erythromycin, achromycin [tetracycline], and macrolides and ketolides. Past Medical History Patient  has a past medical history of Allergy, Diverticulitis, Diverticulitis, Family history of polyps in the colon, GERD (gastroesophageal reflux disease), Hyperlipidemia, and OSA on CPAP. Past Surgical History Patient  has a past surgical history that includes Cholecystectomy (2005); Wrist fracture surgery (Right); Gallbladder surgery (2005); Bone graft hip iliac crest; colon polyps ; and prolapse. Family History: Patient family history includes Arthritis in her mother; Breast cancer (age of onset: 56) in her mother; COPD in her mother; Cancer in her mother; Diabetes in her maternal grandfather; Hearing loss in her mother; Heart attack in her brother and maternal grandfather; Hyperlipidemia in her mother; Stroke in her maternal grandmother. Social History:  Patient  reports that she quit smoking about 10 years ago. Her smoking use included  cigarettes. She has never used smokeless tobacco. She reports current alcohol use of about 7.0 standard drinks of alcohol per week. She reports that she does not use drugs.  Review of Systems: Constitutional: negative for fever or malaise Ophthalmic: negative for photophobia, double vision or loss of vision Cardiovascular: negative for chest pain, dyspnea on exertion, or new LE swelling Respiratory: negative for SOB or persistent cough Gastrointestinal: Positive for right upper quadrant abdominal pain, no change in bowel habits or melena Genitourinary: negative for dysuria or gross hematuria, no abnormal uterine bleeding or disharge Musculoskeletal: negative for new gait disturbance or  muscular weakness Integumentary: negative for new or persistent rashes, no breast lumps Neurological: negative for TIA or stroke symptoms Psychiatric: negative for SI or delusions Allergic/Immunologic: negative for hives  Patient Care Team    Relationship Specialty Notifications Start End  Willow Ora, MD PCP - General Family Medicine  08/26/17   Konrad Penta, MD Referring Physician Gastroenterology  08/26/17   Sallye Lat, MD Consulting Physician Ophthalmology  08/26/17   Dr. Celine Mans    08/26/17    Comment: Dentist, [phone]  Carrington Clamp, MD Consulting Physician Obstetrics and Gynecology  06/30/22     Objective  Vitals: BP 134/82   Pulse 64   Temp 98.3 F (36.8 C)   Ht  (1.473 m)   Wt 194 lb 12.8 oz (88.4 kg)   SpO2 99%   BMI 40.71 kg/m  General:  Well developed, well nourished, no acute distress  Psych:  Alert and orientedx3,normal mood and affect HEENT:  Normocephalic, atraumatic, non-icteric sclera,  supple neck without adenopathy, mass or thyromegaly Cardiovascular:  Normal S1, S2, RRR without gallop, rub or murmur Respiratory:  Good breath sounds bilaterally, CTAB with normal respiratory effort Gastrointestinal: normal bowel sounds, soft, non-tender, no noted masses. No HSM MSK: extremities without edema, joints without erythema or swelling Neurologic:    Mental status is normal.  Gross motor and sensory exams are normal.  No tremor Skin: Eczematous patches on bilateral palms and left forearm with flaking  Commons side effects, risks, benefits, and alternatives for medications and treatment plan prescribed today were discussed, and the patient expressed understanding of the given instructions. Patient is instructed to call or message via MyChart if he/she has any questions or concerns regarding our treatment plan. No barriers to understanding were identified. We discussed Red Flag symptoms and signs in detail. Patient expressed understanding  regarding what to do in case of urgent or emergency type symptoms.  Medication list was reconciled, printed and provided to the patient in AVS. Patient instructions and summary information was reviewed with the patient as documented in the AVS. This note was prepared with assistance of Dragon voice recognition software. Occasional wrong-word or sound-a-like substitutions may have occurred due to the inherent limitations of voice recognition software

## 2022-06-30 NOTE — Patient Instructions (Signed)
Please return in 12 months for your annual complete physical; please come fasting. Sooner if needed for abdominal pain or other problems.   We will call you to get you set up for an abdominal ultrasound of your liver and gallbladder.   I will release your lab results to you on your MyChart account with further instructions. You may see the results before I do, but when I review them I will send you a message with my report or have my assistant call you if things need to be discussed. Please reply to my message with any questions. Thank you!   Today you were given your 1st of 2 Shingrix vaccination.  Please schedule a nurse visit in 6 months to receive your 2nd dose.   If you have any questions or concerns, please don't hesitate to send me a message via MyChart or call the office at [phone]. Thank you for visiting with [place] today! It's our pleasure caring for you.   Please do these things to maintain good health!  Exercise at least 30-45 minutes a day,  4-5 days a week.  Eat a low-fat diet with lots of fruits and vegetables, up to 7-9 servings per day. Drink plenty of water daily. Try to drink 8 8oz glasses per day. Seatbelts can save your life. Always wear your seatbelt. Place Smoke Detectors on every level of your home and check batteries every year. Schedule an appointment with an eye doctor for an eye exam every 1-2 years Safe sex - use condoms to protect yourself from STDs if you could be exposed to these types of infections. Use birth control if you do not want to become pregnant and are sexually active. Avoid heavy alcohol use. If you drink, keep it to less than 2 drinks/day and not every day. Health Care Power of Attorney.  Choose someone you trust that could speak for you if you became unable to speak for yourself. Depression is common in our stressful world.If you're feeling down or losing interest in things you normally enjoy, please come in for a visit. If anyone is threatening or  hurting you, please get help. Physical or Emotional Violence is never OK.

## 2022-07-01 ENCOUNTER — Ambulatory Visit: Payer: No Typology Code available for payment source | Admitting: Occupational Medicine

## 2022-07-01 ENCOUNTER — Encounter: Payer: Self-pay | Admitting: Family Medicine

## 2022-07-01 ENCOUNTER — Other Ambulatory Visit: Payer: Self-pay

## 2022-07-01 VITALS — BP 134/80

## 2022-07-01 DIAGNOSIS — Z0000 Encounter for general adult medical examination without abnormal findings: Secondary | ICD-10-CM

## 2022-07-01 LAB — HEMOGLOBIN A1C
Hgb A1c MFr Bld: 6 % of total Hgb — ABNORMAL HIGH (ref <5.7)
Mean Plasma Glucose: 126 mg/dL
eAG (mmol/L): 7 mmol/L

## 2022-07-01 MED ORDER — OMEPRAZOLE 20 MG PO CPDR: 20.0000 mg | DELAYED_RELEASE_CAPSULE | Freq: Every day | ORAL | 3 refills | Status: DC

## 2022-07-01 NOTE — Progress Notes (Signed)
[name] insurance premium discount evaluation: MET  Patient completed PCM office visit epic reviewed by RN Bess Kinds and transcribed. Labs  Tobacco attestation signed. [facility] ROI formed signed. Forms placed in the chart.   Patient given handouts for [facility] pharmacies and discount drugs list,MyChart, Tele doc setup, Tele doc [address], [place] counseling and [employer] counseling.  What to do for infectious illness protocol. Given handout for list of medications that can be filled at [facility]. Given Clinic hours and Clinic Email.

## 2022-07-01 NOTE — Telephone Encounter (Signed)
Spoke with pt and medication has been sent to pharmacy.

## 2022-07-02 ENCOUNTER — Encounter: Payer: Self-pay | Admitting: Registered Nurse

## 2022-07-02 ENCOUNTER — Telehealth: Payer: Self-pay | Admitting: Registered Nurse

## 2022-07-02 DIAGNOSIS — K219 Gastro-esophageal reflux disease without esophagitis: Secondary | ICD-10-CM

## 2022-07-02 MED ORDER — OMEPRAZOLE 20 MG PO CPDR: 20.0000 mg | DELAYED_RELEASE_CAPSULE | Freq: Every day | ORAL | 3 refills | Status: DC

## 2022-07-02 NOTE — Telephone Encounter (Signed)
Patient last filled 90 day supply omeprazole DR  daily po 8/3/202 from [facility].  Magnesium normal   Latest Reference Range & Units 03/31/21 09:09  Magnesium 1.6 - 2.3 mg/dL 2.2  Patient requested to restart refills through work [facility] clinic.  [name] labs completed for 2025 noted patient has had weight gain over the past year.25 lbs.  Patient had stopped noom program.  90 day supply omeprazole  for GERD dispensed  to patient today.  Will check magnesium level with next labs.

## 2022-07-08 ENCOUNTER — Ambulatory Visit: Payer: No Typology Code available for payment source | Admitting: Occupational Medicine

## 2022-07-08 VITALS — BP 126/78

## 2022-07-08 DIAGNOSIS — Z0000 Encounter for general adult medical examination without abnormal findings: Secondary | ICD-10-CM

## 2022-07-15 ENCOUNTER — Ambulatory Visit: Payer: No Typology Code available for payment source | Admitting: Occupational Medicine

## 2022-07-15 VITALS — BP 128/72

## 2022-07-15 DIAGNOSIS — Z0000 Encounter for general adult medical examination without abnormal findings: Secondary | ICD-10-CM

## 2022-07-17 ENCOUNTER — Ambulatory Visit
Admission: RE | Admit: 2022-07-17 | Discharge: 2022-07-17 | Disposition: A | Discharge status: Home / Self Care | Payer: No Typology Code available for payment source | Source: Ambulatory Visit | Attending: Family Medicine | Admitting: Family Medicine

## 2022-07-17 DIAGNOSIS — R1011 Right upper quadrant pain: Secondary | ICD-10-CM

## 2022-07-21 ENCOUNTER — Encounter: Payer: Self-pay | Admitting: Family Medicine

## 2022-07-21 ENCOUNTER — Ambulatory Visit: Payer: No Typology Code available for payment source | Admitting: Occupational Medicine

## 2022-07-21 DIAGNOSIS — K760 Fatty (change of) liver, not elsewhere classified: Secondary | ICD-10-CM | POA: Insufficient documentation

## 2022-07-22 ENCOUNTER — Encounter: Payer: Self-pay | Admitting: Family Medicine

## 2022-07-25 NOTE — Telephone Encounter (Signed)
Labs completed at [facility] WNL, lipids, Hgba1c        Component Ref Range & Units 3 wk ago (06/30/22) 1 yr ago (03/31/21) 1 yr ago (09/17/20) 2 yr ago (03/21/20) 4 yr ago (01/13/18)  Cholesterol 0 - 200 mg/dL 161 High     096 CM  Comment: ATP III Classification       Desirable:  < 200 mg/dL               Borderline High:  200 - 239 mg/dL          High:  > = 045 mg/dL  Triglycerides 0.0 - 409.8 mg/dL 119.1 478 R 295 High  R 82 R 95.0 CM  Comment: Normal:  <150 mg/dLBorderline High:  150 - 199 mg/dL  HDL >62.13 mg/dL 08.65 64 R 70 R 55 R 78.46  VLDL 0.0 - 40.0 mg/dL 96.2    95.2  LDL Cholesterol 0 - 99 mg/dL 841 High     324 High   Total CHOL/HDL Ratio 4 3.4 R, CM 3.4 R, CM 3.7 R, CM 4 CM  Comment:                Men          Women1/2 Average Risk     3.4          3.3Average Risk          5.0          4.42X Average Risk          9.6          7.13X Average Risk          15.0          11.0                      NonHDL 160.96    145.23 CM  Comment: NOTE:  Non-HDL goal should be 30 mg/dL higher than patient's LDL goal (i.e. LDL goal of < 70 mg/dL, would have non-HDL goal of < 100 mg/dL)  Resulting [facility] [place] [place] [facility]         Specimen Collected: 06/30/22 10:02 Last Resulted: 06/30/22 15:28           Latest Reference Range & Units 06/30/22 10:02  WBC 4.0 - 10.5 K/uL 5.0  RBC 3.87 - 5.11 Mil/uL 4.86  Hemoglobin 12.0 - 15.0 g/dL 40.1  HCT 02.7 - 25.3 % 44.2  MCV 78.0 - 100.0 fl 90.9  MCHC 30.0 - 36.0 g/dL 66.4  RDW 40.3 - 47.4 % 14.5  Platelets 150.0 - 400.0 K/uL 271.0  Neutrophils 43.0 - 77.0 % 58.6  Lymphocytes 12.0 - 46.0 % 29.2  Monocytes Relative 3.0 - 12.0 % 9.3  Eosinophil 0.0 - 5.0 % 1.9  Basophil 0.0 - 3.0 % 1.0  NEUT# 1.4 - 7.7 K/uL 2.9  Lymphocyte # 0.7 - 4.0 K/uL 1.5  Monocyte # 0.1 - 1.0 K/uL 0.5  Eosinophils Absolute 0.0 - 0.7 K/uL 0.1  Basophils Absolute 0.0 - 0.1 K/uL 0.1  eAG (mmol/L) mmol/L 7.0  Glucose 70 - 99 mg/dL  96  Hemoglobin Q5Z <5.6 % of total Hgb 6.0 (H)  (H): Data is abnormally high  BP 134/80 with RN Kimrey 07/01/22  Hgba1c and BP met requirements for 2025 insurance discount.  Patient signed paperwork with RN Kimrey.

## 2022-08-06 ENCOUNTER — Encounter: Payer: Self-pay | Admitting: Registered Nurse

## 2022-08-06 ENCOUNTER — Ambulatory Visit: Payer: No Typology Code available for payment source | Admitting: Registered Nurse

## 2022-08-06 VITALS — BP 140/80 | HR 72 | Temp 97.5°F

## 2022-08-06 DIAGNOSIS — J029 Acute pharyngitis, unspecified: Secondary | ICD-10-CM

## 2022-08-06 DIAGNOSIS — J069 Acute upper respiratory infection, unspecified: Secondary | ICD-10-CM

## 2022-08-06 MED ORDER — AMOXICILLIN 875 MG PO TABS: 875.0000 mg | ORAL_TABLET | Freq: Two times a day (BID) | ORAL | 0 refills | Status: AC

## 2022-08-06 MED ORDER — PREDNISONE 10 MG (21) PO TBPK: ORAL_TABLET | ORAL | 0 refills | Status: DC

## 2022-08-06 MED ORDER — BENZONATATE 100 MG PO CAPS: 100.0000 mg | ORAL_CAPSULE | Freq: Two times a day (BID) | ORAL | 0 refills | Status: AC | PRN

## 2022-08-06 MED ORDER — SALINE SPRAY 0.65 % NA SOLN: 2.0000 | NASAL | 0 refills | Status: DC

## 2022-08-06 NOTE — Progress Notes (Signed)
Subjective:    Patient ID: Sandra Lara, female    DOB: [birth year], 62 y.o.   MRN: [id number]  61y/o established caucasian female here for evaluation sinus pain/pressure, post nasal drip, nasal congestion, rhinitis, and cough.  Not improving with sudafed, nasal saline.  Denied sick contacts/fever/chills/n/v/d.  Had upper teeth pain that has resolved/saw her dentist and no dental injury/cavity noted.      Review of Systems  Constitutional:  Negative for activity change, appetite change, chills, diaphoresis, fatigue, fever and unexpected weight change.  HENT:  Positive for congestion, postnasal drip, rhinorrhea, sinus pressure and sinus pain. Negative for dental problem, drooling, ear discharge, ear pain, facial swelling, hearing loss, mouth sores, nosebleeds, sneezing, sore throat, tinnitus, trouble swallowing and voice change.   Eyes:  Negative for photophobia, pain, discharge, redness, itching and visual disturbance.  Respiratory:  Positive for cough. Negative for choking, chest tightness, shortness of breath, wheezing and stridor.   Cardiovascular:  Negative for chest pain, palpitations and leg swelling.  Gastrointestinal:  Negative for diarrhea, nausea and vomiting.  Endocrine: Negative for cold intolerance and heat intolerance.  Genitourinary:  Negative for difficulty urinating.  Musculoskeletal:  Negative for arthralgias, back pain, gait problem, joint swelling, myalgias, neck pain and neck stiffness.  Skin:  Negative for color change, pallor, rash and wound.  Allergic/Immunologic: Positive for environmental allergies. Negative for food allergies.  Neurological:  Negative for dizziness, tremors, seizures, syncope, facial asymmetry, speech difficulty, weakness, light-headedness and numbness.  Hematological:  Negative for adenopathy. Does not bruise/bleed easily.  Psychiatric/Behavioral:  Negative for agitation, behavioral problems, confusion and sleep disturbance.         Objective:   Physical Exam Vitals and nursing note reviewed.  Constitutional:      General: She is awake. She is not in acute distress.    Appearance: Normal appearance. She is well-developed and well-groomed. She is obese. She is not ill-appearing, toxic-appearing or diaphoretic.  HENT:     Head: Normocephalic and atraumatic.     Jaw: There is normal jaw occlusion. No trismus.     Salivary Glands: Right salivary gland is not diffusely enlarged or tender. Left salivary gland is not diffusely enlarged or tender.     Right Ear: Hearing, ear canal and external ear normal. No decreased hearing noted. No laceration, drainage, swelling or tenderness. A middle ear effusion is present. There is no impacted cerumen. No foreign body. No mastoid tenderness. No PE tube. No hemotympanum. Tympanic membrane is not injected, scarred, perforated, erythematous, retracted or bulging.     Left Ear: Hearing, ear canal and external ear normal. No decreased hearing noted. No laceration, drainage, swelling or tenderness. A middle ear effusion is present. There is no impacted cerumen. No foreign body. No mastoid tenderness. No PE tube. No hemotympanum. Tympanic membrane is not injected, scarred, perforated, erythematous, retracted or bulging.     Ears:     Comments: Bilateral TMs intact air fluid level clear    Nose: Mucosal edema, congestion and rhinorrhea present. No nasal deformity, septal deviation or laceration. Rhinorrhea is clear.     Right Turbinates: Enlarged and swollen. Not pale.     Left Turbinates: Enlarged and swollen. Not pale.     Right Sinus: Maxillary sinus tenderness present. No frontal sinus tenderness.     Left Sinus: Maxillary sinus tenderness present. No frontal sinus tenderness.     Mouth/Throat:     Lips: Pink. No lesions.     Mouth: Mucous membranes are moist. Mucous  membranes are not pale, not dry and not cyanotic. No lacerations, oral lesions or angioedema.     Dentition: No dental  abscesses or gum lesions.     Tongue: No lesions. Tongue does not deviate from midline.     Palate: No mass and lesions.     Pharynx: Uvula midline. Pharyngeal swelling and posterior oropharyngeal erythema present. No oropharyngeal exudate or uvula swelling.     Tonsils: No tonsillar exudate or tonsillar abscesses. 1+ on the right. 1+ on the left.     Comments: Cobblestoning posterior pharynx; macular erythema oropharynx; bilateral allergic shiners; nasal turbinates edema/erythema clear discharge; nasal congestion audible Eyes:     General: Lids are normal. Vision grossly intact. Gaze aligned appropriately. Allergic shiner present. No scleral icterus.       Right eye: No foreign body, discharge or hordeolum.        Left eye: No foreign body, discharge or hordeolum.     Extraocular Movements: Extraocular movements intact.     Right eye: Normal extraocular motion and no nystagmus.     Left eye: Normal extraocular motion and no nystagmus.     Conjunctiva/sclera: Conjunctivae normal.     Right eye: Right conjunctiva is not injected. No chemosis, exudate or hemorrhage.    Left eye: Left conjunctiva is not injected. No chemosis, exudate or hemorrhage.    Pupils: Pupils are equal, round, and reactive to light. Pupils are equal.     Right eye: Pupil is round and reactive.     Left eye: Pupil is round and reactive.  Neck:     Thyroid: No thyroid mass or thyromegaly.     Trachea: Trachea and phonation normal. No tracheal tenderness or tracheal deviation.  Cardiovascular:     Rate and Rhythm: Normal rate and regular rhythm.     Pulses: Normal pulses.          Radial pulses are 2+ on the right side and 2+ on the left side.     Heart sounds: Normal heart sounds, S1 normal and S2 normal.  Pulmonary:     Effort: Pulmonary effort is normal. No accessory muscle usage or respiratory distress.     Breath sounds: Normal breath sounds and air entry. No stridor or decreased air movement. No decreased breath  sounds, wheezing, rhonchi or rales.     Comments: Spoke full sentences without difficulty; nasal sniffing observed in exam room Chest:     Chest wall: No tenderness.  Abdominal:     General: There is no distension.     Palpations: Abdomen is soft.  Musculoskeletal:        General: No tenderness. Normal range of motion.     Right hand: Normal strength. Normal capillary refill.     Left hand: Normal strength. Normal capillary refill.     Cervical back: Normal range of motion and neck supple. No swelling, edema, deformity, erythema, signs of trauma, lacerations, rigidity, spasms, torticollis, tenderness or crepitus. No pain with movement or muscular tenderness. Normal range of motion.     Thoracic back: No swelling, edema, deformity, signs of trauma, lacerations, spasms or tenderness. Normal range of motion.     Right lower leg: No edema.     Left lower leg: No edema.  Lymphadenopathy:     Head:     Right side of head: No submental, submandibular, tonsillar, preauricular, posterior auricular or occipital adenopathy.     Left side of head: No submental, submandibular, tonsillar, preauricular, posterior auricular or occipital  adenopathy.     Cervical: No cervical adenopathy.     Right cervical: No superficial, deep or posterior cervical adenopathy.    Left cervical: No superficial, deep or posterior cervical adenopathy.  Skin:    General: Skin is warm and dry.     Capillary Refill: Capillary refill takes less than 2 seconds.     Coloration: Skin is not ashen, cyanotic, jaundiced, mottled, pale or sallow.     Findings: No abrasion, abscess, acne, bruising, burn, ecchymosis, erythema, signs of injury, laceration, lesion, petechiae, rash or wound.     Nails: There is no clubbing.  Neurological:     General: No focal deficit present.     Mental Status: She is alert and oriented to person, place, and time. She is not disoriented.     GCS: GCS eye subscore is 4. GCS verbal subscore is 5. GCS  motor subscore is 6.     Cranial Nerves: Cranial nerves 2-12 are intact. No cranial nerve deficit, dysarthria or facial asymmetry.     Sensory: No sensory deficit.     Motor: Motor function is intact. No weakness, tremor, atrophy, abnormal muscle tone or seizure activity.     Coordination: Coordination is intact. Coordination normal.     Gait: Gait is intact. Gait normal.     Comments: In/out of chair and on/off exam table without difficulty; gait sure and steady in clinic; bilateral hand grasp equal 5/5  Psychiatric:        Attention and Perception: Attention and perception normal.        Mood and Affect: Mood and affect normal.        Speech: Speech normal.        Behavior: Behavior normal. Behavior is cooperative.        Thought Content: Thought content normal.        Cognition and Memory: Cognition and memory normal.        Judgment: Judgment normal.      Home covid test results negative     Assessment & Plan:   A-URI with cough and congestion, acute pharyngitis  P-free [place] govt home covid test given to patient to complete prior to returning to [facility].   Discussed use albuterol inhaler 117mcg/act 2 puffs po q4-6h prn chest tightness, wheezing, protracted coughing already at home.  Honey 1 tablespoon po q4h prn cough. Prednisone 10mg  taper po with breakfast (60mg  day 1/50/40/30/20/10) #21 RF0 dispensed from [facility] to patient has worked well in the past for viral sinusitis not responding to usual regimen saline/flonase/sudafed/antihistamine/shower. Tessalon pearles 200mg  po q8h prn cough #30 RF0 sent to her pharmacy of choice.  Discussed flu typically body aches/fever which she does not have   Discussed need to dry up post nasal drip and will help with cough/sore throat also.  Discussed many viruses circulating in community.  Discussed hydrate with water to keep urine pale yellow clear and voiding every 2-4 hours while awake.  Patient may use normal saline nasal spray 2 sprays each  nostril q2h wa as needed. flonase 1 spray each nostril BID OTC.  Sudafed 30mg  1-2 tabs po q4-6h max 240mg /8 tabs per 24 hours may cause drowsiness/elevated heart rate/insomnia/med head.  Avoid driving until she knows how he responds to medication.  OTC antihistamine of choice claritin/zyrtec 10mg  po daily.  May use OTC robitussin, delsym, nyquil per manufacturer instructions for cough if honey not helping enough.  Discussed post nasal drip triggering cough need to get it dried up.  Avoid triggers if possible.  Shower prior to bedtime if exposed to triggers.  If allergic dust/dust mites recommend mattress/pillow covers/encasements; washing linens, vacuuming, sweeping, dusting weekly.  Call or return to clinic as needed if these symptoms worsen or fail to improve as anticipated.   Exitcare handouts viral UR with cough, and sinus rinse sent to patient my chart.  Patient verbalized understanding of instructions, agreed with plan of care and had no further questions at this time.  P2:  Avoidance and hand washing.     Usually no specific medical treatment is needed if a virus is causing the sore throat. The throat most often gets better on its own within 5 to 7 days. Antibiotic medicine does not cure viral pharyngitis.  -For acute pharyngitis caused by bacteria, your healthcare provider will prescribe an antibiotic.   Start amoxicillin 875mg  po BID x 10 days if white spots back of throat or worsening throat pain after 48 hours of prednisone and still red oropharynx.   - Tylenol 1000mg  by mouth every 6 hours as needed for pain or fever OTC ER/call 911 if drooling, unable to swallow, trouble breathing discussed tonsillar abscess symptoms/hot potato voice. -Do not smoke.  -Avoid secondhand smoke and other air pollutants.  -Use a cool mist humidifier to add moisture to the air.  -Get plenty of rest; sleep 7-8 hours per night -You may want to rest your throat by talking less and eating a diet that is mostly  liquid or soft for a day or two.  -Nonprescription throat lozenges and mouthwashes should help relieve the soreness.  -Gargling with warm saltwater and drinking warm liquids may help. (You can make a saltwater solution by adding 1/4 teaspoon of salt to 8 ounces, or 240 mL, of warm water.)  -Change your toothbrush on your last day of antibiotic -Avoid oral intimate contact until antibiotics finished -Wash dishes/silverware/glasses in hot water or diswasher -Do not share glasses/silverware/dishes during meals that touch your lips/mouth -Usually no specific medical treatment is needed if a virus is causing the sore throat. The throat most often gets better on its own within 5 to 7 days. Antibiotic medicine does not cure viral pharyngitis. DDx mononucleosis, strep throat, hand foot and mouth, post nasal drip irritation pharynx/throat -Exitcare handout on pharyngitis acute and strep throat given to patient FOLLOW UP with clinic provider if no improvements in the next 7-10 days. Patient verbalized understanding of instructions and agreed with plan of care.  P2: Hand washing and diet.

## 2022-08-06 NOTE — Patient Instructions (Addendum)
Strep Throat, Adult Strep throat is an infection in the throat that is caused by bacteria. It is common during the cold months of the year. It mostly affects children who are 31-62 years old. However, people of all ages can get it at any time of the year. This infection spreads from person to person (is contagious) through coughing, sneezing, or having close contact. Your health care provider may use other names to describe the infection. When strep throat affects the tonsils, it is called tonsillitis. When it affects the back of the throat, it is called pharyngitis. What are the causes? This condition is caused by the Streptococcus pyogenes bacteria. What increases the risk? You are more likely to develop this condition if: You care for school-age children, or are around school-age children. Children are more likely to get strep throat and may spread it to others. You spend time in crowded places where the infection can spread easily. You have close contact with someone who has strep throat. What are the signs or symptoms? Symptoms of this condition include: Fever or chills. Redness, swelling, or pain in the tonsils or throat. Pain or difficulty when swallowing. White or yellow spots on the tonsils or throat. Tender glands in the neck and under the jaw. Bad smelling breath. Red rash all over the body. This is rare. How is this diagnosed? This condition is diagnosed by tests that check for the presence and the amount of bacteria that cause strep throat. They are: Rapid strep test. Your throat is swabbed and checked for the presence of bacteria. Results are usually ready in minutes. Throat culture test. Your throat is swabbed. The sample is placed in a cup that allows infections to grow. Results are usually ready in 1 or 2 days. How is this treated? This condition may be treated with: Medicines that kill germs (antibiotics). Medicines that relieve pain or fever. These include: Ibuprofen or  acetaminophen. Aspirin, only for people who are over the age of 15. Throat lozenges. Throat sprays. Follow these instructions at home: Medicines  Take over-the-counter and prescription medicines only as told by your health care provider. Take your antibiotic medicine as told by your health care provider. Do not stop taking the antibiotic even if you start to feel better. Eating and drinking  If you have trouble swallowing, try eating soft foods until your sore throat feels better. Drink enough fluid to keep your urine pale yellow. To help relieve pain, you may have: Warm fluids, such as soup and tea. Cold fluids, such as frozen desserts or popsicles. General instructions Gargle with a salt-water mixture 3-4 times a day or as needed. To make a salt-water mixture, completely dissolve -1 tsp (3-6 g) of salt in 1 cup (237 mL) of warm water. Get plenty of rest. Stay home from work or school until you have been taking antibiotics for 24 hours. Do not use any products that contain nicotine or tobacco. These products include cigarettes, chewing tobacco, and vaping devices, such as e-cigarettes. If you need help quitting, ask your health care provider. It is up to you to get your test results. Ask your health care provider, or the department that is doing the test, when your results will be ready. Keep all follow-up visits. This is important. How is this prevented?  Do not share food, drinking cups, or personal items that could cause the infection to spread to other people. Wash your hands often with soap and water for at least 20 seconds. If soap  and water are not available, use hand sanitizer. Make sure that all people in your house wash their hands well. Have family members tested if they have a sore throat or fever. They may need an antibiotic if they have strep throat. Contact a health care provider if: You have swelling in your neck that keeps getting bigger. You develop a rash, cough, or  earache. You cough up a thick mucus that is green, yellow-brown, or bloody. You have pain or discomfort that does not get better with medicine. Your symptoms seem to be getting worse. You have a fever. Get help right away if: You have new symptoms, such as vomiting, severe headache, stiff or painful neck, chest pain, or shortness of breath. You have severe throat pain, drooling, or changes in your voice. You have swelling of the neck, or the skin on the neck becomes red and tender. You have signs of dehydration, such as tiredness (fatigue), dry mouth, and decreased urination. You become increasingly sleepy, or you cannot wake up completely. Your joints become red or painful. These symptoms may represent a serious problem that is an emergency. Do not wait to see if the symptoms will go away. Get medical help right away. Call your local emergency services (911 in the [place] Do not drive yourself to the hospital. Summary Strep throat is an infection in the throat that is caused by the Streptococcus pyogenes bacteria. This infection is spread from person to person (is contagious) through coughing, sneezing, or having close contact. Take your medicines, including antibiotics, as told by your health care provider. Do not stop taking the antibiotic even if you start to feel better. To prevent the spread of germs, wash your hands well with soap and water. Have others do the same. Do not share food, drinking cups, or personal items. Get help right away if you have new symptoms, such as vomiting, severe headache, stiff or painful neck, chest pain, or shortness of breath. This information is not intended to replace advice given to you by your health care provider. Make sure you discuss any questions you have with your health care provider. Document Revised: 06/18/2020 Document Reviewed: 06/18/2020 Elsevier Patient Education  2024 [employer] Pharyngitis  Pharyngitis is inflammation of the throat  (pharynx). It is a very common cause of sore throat. Pharyngitis can be caused by a bacteria, but it is usually caused by a virus. Most cases of pharyngitis get better on their own without treatment. What are the causes? This condition may be caused by: Infection by viruses (viral). Viral pharyngitis spreads easily from person to person (is contagious) through coughing, sneezing, and sharing of personal items or utensils such as cups, forks, spoons, and toothbrushes. Infection by bacteria (bacterial). Bacterial pharyngitis may be spread by touching the nose or face after coming in contact with the bacteria, or through close contact, such as kissing. Allergies. Allergies can cause buildup of mucus in the throat (post-nasal drip), leading to inflammation and irritation. Allergies can also cause blocked nasal passages, forcing breathing through the mouth, which dries and irritates the throat. What increases the risk? You are more likely to develop this condition if: You are 79-20 years old. You are exposed to crowded environments such as daycare, school, or dormitory living. You live in a cold climate. You have a weakened disease-fighting (immune) system. What are the signs or symptoms? Symptoms of this condition vary by the cause. Common symptoms of this condition include: Sore throat. Fatigue. Low-grade fever. Stuffy nose (nasal  congestion) and cough. Headache. Other symptoms may include: Glands in the neck (lymph nodes) that are swollen. Skin rashes. Plaque-like film on the throat or tonsils. This is often a symptom of bacterial pharyngitis. Vomiting. Red, itchy eyes (conjunctivitis). Loss of appetite. Joint pain and muscle aches. Enlarged tonsils. How is this diagnosed? This condition may be diagnosed based on your medical history and a physical exam. Your health care provider will ask you questions about your illness and your symptoms. A swab of your throat may be done to check for  bacteria (rapid strep test). Other lab tests may also be done, depending on the suspected cause, but these are rare. How is this treated? Many times, treatment is not needed for this condition. Pharyngitis usually gets better in 3-4 days without treatment. Bacterial pharyngitis may be treated with antibiotic medicines. Follow these instructions at home: Medicines Take over-the-counter and prescription medicines only as told by your health care provider. If you were prescribed an antibiotic medicine, take it as told by your health care provider. Do not stop taking the antibiotic even if you start to feel better. Use throat sprays to soothe your throat as told by your health care provider. Children can get pharyngitis. Do not give your child aspirin because of the association with Reye's syndrome. Managing pain To help with pain, try: Sipping warm liquids, such as broth, herbal tea, or warm water. Eating or drinking cold or frozen liquids, such as frozen ice pops. Gargling with a mixture of salt and water 3-4 times a day or as needed. To make salt water, completely dissolve -1 tsp (3-6 g) of salt in 1 cup (237 mL) of warm water. Sucking on hard candy or throat lozenges. Putting a cool-mist humidifier in your bedroom at night to moisten the air. Sitting in the bathroom with the door closed for 5-10 minutes while you run hot water in the shower.  General instructions  Do not use any products that contain nicotine or tobacco. These products include cigarettes, chewing tobacco, and vaping devices, such as e-cigarettes. If you need help quitting, ask your health care provider. Rest as told by your health care provider. Drink enough fluid to keep your urine pale yellow. How is this prevented? To help prevent becoming infected or spreading infection: Wash your hands often with soap and water for at least 20 seconds. If soap and water are not available, use hand sanitizer. Do not touch your eyes,  nose, or mouth with unwashed hands, and wash hands after touching these areas. Do not share cups or eating utensils. Avoid close contact with people who are sick. Contact a health care provider if: You have large, tender lumps in your neck. You have a rash. You cough up green, yellow-brown, or bloody mucus. Get help right away if: Your neck becomes stiff. You drool or are unable to swallow liquids. You cannot drink or take medicines without vomiting. You have severe pain that does not go away, even after you take medicine. You have trouble breathing, and it is not caused by a stuffy nose. You have new pain and swelling in your joints such as the knees, ankles, wrists, or elbows. These symptoms may represent a serious problem that is an emergency. Do not wait to see if the symptoms will go away. Get medical help right away. Call your local emergency services (911 in the [place] Do not drive yourself to the hospital. Summary Pharyngitis is redness, pain, and swelling (inflammation) of the throat (pharynx). While pharyngitis  can be caused by a bacteria, the most common causes are viral. Most cases of pharyngitis get better on their own without treatment. Bacterial pharyngitis is treated with antibiotic medicines. This information is not intended to replace advice given to you by your health care provider. Make sure you discuss any questions you have with your health care provider. Document Revised: 05/22/2020 Document Reviewed: 05/22/2020 Elsevier Patient Education  2024 [employer] Cough, Adult Coughing is a reflex that clears your throat and airways (respiratory system). It helps heal and protect your lungs. It is normal to cough from time to time. A cough that happens with other symptoms or that lasts a long time may be a sign of a condition that needs treatment. A short-term (acute) cough may only last 2-3 weeks. A long-term (chronic) cough may last 8 or more weeks. Coughing is often  caused by: Diseases, such as: An infection of the respiratory system. Asthma or other heart or lung diseases. Gastroesophageal reflux. This is when acid comes back up from the stomach. Breathing in things that irritate your lungs. Allergies. Postnasal drip. This is when mucus runs down the back of your throat. Smoking. Some medicines. Follow these instructions at home: Medicines Take over-the-counter and prescription medicines only as told by your health care provider. Talk with your provider before you take cough medicine (cough suppressants). Eating and drinking Do not drink alcohol. Avoid caffeine. Drink enough fluid to keep your pee (urine) pale yellow. Lifestyle Avoid cigarette smoke. Do not use any products that contain nicotine or tobacco. These products include cigarettes, chewing tobacco, and vaping devices, such as e-cigarettes. If you need help quitting, ask your provider. Avoid things that make you cough. These may include perfumes, candles, cleaning products, or campfire smoke. General instructions  Watch for any changes to your cough. Tell your provider about them. Always cover your mouth when you cough. If the air is dry in your bedroom or home, use a cool mist vaporizer or humidifier. If your cough is worse at night, try to sleep in a semi-upright position. Rest as needed. Contact a health care provider if: You have new symptoms, or your symptoms get worse. You cough up pus. You have a fever that does not go away or a cough that does not get better after 2-3 weeks. You cannot control your cough with medicine, and you are losing sleep. You have pain that gets worse or is not helped with medicine. You lose weight for no clear reason. You have night sweats. Get help right away if: You cough up blood. You have trouble breathing. Your heart is beating very fast. These symptoms may be an emergency. Get help right away. Call 911. Do not wait to see if the symptoms  will go away. Do not drive yourself to the hospital. This information is not intended to replace advice given to you by your health care provider. Make sure you discuss any questions you have with your health care provider. Document Revised: 10/24/2021 Document Reviewed: 10/24/2021 Elsevier Patient Education  2024 [employer] Viral Respiratory Infection A respiratory infection is an illness that affects part of the respiratory system, such as the lungs, nose, or throat. A respiratory infection that is caused by a virus is called a viral respiratory infection. Common types of viral respiratory infections include: A cold. The flu (influenza). A respiratory syncytial virus (RSV) infection. What are the causes? This condition is caused by a virus. The virus may spread through contact with droplets or direct  contact with infected people or their mucus or secretions. The virus may spread from person to person (is contagious). What are the signs or symptoms? Symptoms of this condition include: A stuffy or runny nose. A sore throat or cough. Shortness of breath or difficulty breathing. Yellow or green mucus (sputum). Other symptoms may include: A fever. Sweating or chills. Fatigue. Achy muscles. A headache. How is this diagnosed? This condition may be diagnosed based on: Your symptoms. A physical exam. Testing of secretions from the nose or throat. Chest X-ray. How is this treated? This condition may be treated with medicines, such as: Antiviral medicine. This may shorten the length of time a person has symptoms. Expectorants. These make it easier to cough up mucus. Decongestant nasal sprays. Acetaminophen or NSAIDs, such as ibuprofen, to relieve fever and pain. Antibiotic medicines are not prescribed for viral infections.This is because antibiotics are designed to kill bacteria. They do not kill viruses. Follow these instructions at home: Managing pain and congestion Take  over-the-counter and prescription medicines only as told by your health care provider. If you have a sore throat, gargle with a mixture of salt and water 3-4 times a day or as needed. To make salt water, completely dissolve -1 tsp (3-6 g) of salt in 1 cup (237 mL) of warm water. Use nose drops made from salt water to ease congestion and soften raw skin around your nose. Take 2 tsp (10 mL) of honey at bedtime to lessen coughing at night. Do not give honey to children who are younger than 1 year. Drink enough fluid to keep your urine pale yellow. This helps prevent dehydration and helps loosen up mucus. General instructions  Rest as much as possible. Do not drink alcohol. Do not use any products that contain nicotine or tobacco. These products include cigarettes, chewing tobacco, and vaping devices, such as e-cigarettes. If you need help quitting, ask your health care provider. Keep all follow-up visits. This is important. How is this prevented?     Get an annual flu shot. You may get the flu shot in late summer, fall, or winter. Ask your health care provider when you should get your flu shot. Avoid spreading your infection to other people. If you are sick: Wash your hands with soap and water often, especially after you cough or sneeze. Wash for at least 20 seconds. If soap and water are not available, use alcohol-based hand sanitizer. Cover your mouth when you cough. Cover your nose and mouth when you sneeze. Do not share cups or eating utensils. Clean commonly used objects often. Clean commonly touched surfaces. Stay home from work or school as told by your health care provider. Avoid contact with people who are sick during cold and flu season. This is generally fall and winter. Contact a health care provider if: Your symptoms last for 10 days or longer. Your symptoms get worse over time. You have severe sinus pain in your face or forehead. The glands in your jaw or neck become very  swollen. You have shortness of breath. Get help right away if you: Feel pain or pressure in your chest. Have trouble breathing. Faint or feel like you will faint. Have severe and persistent vomiting. Feel confused or disoriented. These symptoms may represent a serious problem that is an emergency. Do not wait to see if the symptoms will go away. Get medical help right away. Call your local emergency services (911 in the [place] Do not drive yourself to the hospital. Summary A  respiratory infection is an illness that affects part of the respiratory system, such as the lungs, nose, or throat. A respiratory infection that is caused by a virus is called a viral respiratory infection. Common types of viral respiratory infections include a cold, influenza, and respiratory syncytial virus (RSV) infection. Symptoms of this condition include a stuffy or runny nose, cough, fatigue, achy muscles, sore throat, and fevers or chills. Antibiotic medicines are not prescribed for viral infections. This is because antibiotics are designed to kill bacteria. They are not effective against viruses. This information is not intended to replace advice given to you by your health care provider. Make sure you discuss any questions you have with your health care provider. Document Revised: 05/30/2020 Document Reviewed: 05/30/2020 Elsevier Patient Education  2024 [employer].

## 2022-08-19 ENCOUNTER — Encounter: Payer: Self-pay | Admitting: Family Medicine

## 2022-08-21 NOTE — Telephone Encounter (Signed)
Provider [name] 2025 paperwork signed 07/02/22

## 2022-08-23 NOTE — Progress Notes (Signed)
Noted patient has had follow up labs 

## 2022-12-22 DIAGNOSIS — Z0289 Encounter for other administrative examinations: Secondary | ICD-10-CM

## 2022-12-23 ENCOUNTER — Encounter: Payer: Self-pay | Admitting: Family Medicine

## 2022-12-23 ENCOUNTER — Ambulatory Visit: Payer: No Typology Code available for payment source | Admitting: Family Medicine

## 2022-12-23 VITALS — BP 143/84 | HR 82 | Temp 98.1°F | Ht <= 58 in | Wt 193.0 lb

## 2022-12-23 DIAGNOSIS — R42 Dizziness and giddiness: Secondary | ICD-10-CM | POA: Insufficient documentation

## 2022-12-23 DIAGNOSIS — Z6841 Body Mass Index (BMI) 40.0 and over, adult: Secondary | ICD-10-CM

## 2022-12-23 DIAGNOSIS — E66813 Obesity, class 3: Secondary | ICD-10-CM | POA: Diagnosis not present

## 2022-12-23 DIAGNOSIS — E785 Hyperlipidemia, unspecified: Secondary | ICD-10-CM | POA: Diagnosis not present

## 2022-12-23 DIAGNOSIS — R7303 Prediabetes: Secondary | ICD-10-CM | POA: Insufficient documentation

## 2022-12-23 DIAGNOSIS — E662 Morbid (severe) obesity with alveolar hypoventilation: Secondary | ICD-10-CM

## 2022-12-23 NOTE — Assessment & Plan Note (Signed)
She describes episodic dizziness that is related to quick head movements, on and off > 6 years. She had a neuro workup for this back then but has now had to limit her enjoyable activities like fishing and has fear of going for walking due to this.  This has only contributed to more weight gain and a worse quality of life.    Recommended setting up a follow up visit with ENT for eval and treatment

## 2022-12-23 NOTE — Assessment & Plan Note (Signed)
Lab Results  Component Value Date   HGBA1C 6.0 (H) 06/30/2022   She has been in the prediabetic range, at risk for T2DM with a BMI of 41, a Visceral Fat rating of 21 and a sedentary lifestyle.  She has never been on metformin.    Look for improvements in prediabetic with weight reduction and dietary changes.

## 2022-12-23 NOTE — Assessment & Plan Note (Signed)
Lab Results  Component Value Date   CHOL 224 (H) 06/30/2022   HDL 62.70 06/30/2022   LDLCALC 138 (H) 06/30/2022   TRIG 117.0 06/30/2022   CHOLHDL 4 06/30/2022    She is not on any lipid lowering medications.  She has been working on dietary changes but is limited with exercise due to episodic dizziness.  F/u with PCP for management Look for improvements with healthy dietary changes and weight loss

## 2022-12-23 NOTE — Progress Notes (Signed)
98 Office: [phone]  /  Fax: [phone]   Initial Visit  Smt. Loder was seen in clinic today to evaluate for obesity. She is interested in losing weight to improve overall health and reduce the risk of weight related complications. She presents today to review program treatment options, initial physical assessment, and evaluation.     She was referred by: Friend or Family  When asked what else they would like to accomplish? She states: Improve energy levels and physical activity, Improve existing medical conditions, Reduce number of medications, and Improve quality of life  Weight history:  has been overweight since early childhood.  Has had some success with weight loss using NOOM, down to 165 lb.  Been on multiple diets thru the years. She would like to be <165 lb.  Works a sedentary job.  Has had dizziness that limits her physical activity.    When asked how has your weight affected you? She states: Contributed to medical problems, Contributed to orthopedic problems or mobility issues, and Having fatigue  Some associated conditions: GERD and Vitamin D Deficiency  PDM, hepatic steatosis, HLD  Contributing factors: Family history, Stress, Reduced physical activity, and Menopause  Weight promoting medications identified: None  Current nutrition plan: None  Current level of physical activity: NEAT  Current or previous pharmacotherapy: Other: HCG only  Response to medication: Never tried medications   Past medical history includes:   Past Medical History:  Diagnosis Date   Allergy    Diverticulitis    Diverticulitis    Family history of polyps in the colon    GERD (gastroesophageal reflux disease)    Heartburn   Hyperlipidemia    OSA on CPAP      Objective:   BP (!) 143/84   Pulse 82   Temp 98.1 F (36.7 C)   Ht 4\' 9"  (1.448 m)   Wt 193 lb (87.5 kg)   SpO2 96%   BMI 41.76 kg/m  She was weighed on the bioimpedance scale: Body mass index is 41.76  kg/m.  Peak Weight:211 , Body Fat%:51.3, Visceral Fat Rating:17, Weight trend over the last 12 months: Unchanged  General:  Alert, oriented and cooperative. Patient is in no acute distress.  Respiratory: Normal respiratory effort, no problems with respiration noted   Gait: able to ambulate independently  Mental Status: Normal mood and affect. Normal behavior. Normal judgment and thought content.   DIAGNOSTIC DATA REVIEWED:  BMET    Component Value Date/Time   NA 140 06/30/2022 1002   NA 142 03/31/2021 0909   K 4.1 06/30/2022 1002   CL 105 06/30/2022 1002   CO2 24 06/30/2022 1002   GLUCOSE 96 06/30/2022 1002   BUN 13 06/30/2022 1002   BUN 14 03/31/2021 0909   CREATININE 0.68 06/30/2022 1002   CALCIUM 9.9 06/30/2022 1002   GFRNONAA 97 03/21/2020 0905   GFRAA 112 03/21/2020 0905   Lab Results  Component Value Date   HGBA1C 6.0 (H) 06/30/2022   HGBA1C 5.9 (H) 03/21/2020   No results found for: "INSULIN" CBC    Component Value Date/Time   WBC 5.0 06/30/2022 1002   RBC 4.86 06/30/2022 1002   HGB 14.9 06/30/2022 1002   HGB 14.3 03/31/2021 0909   HCT 44.2 06/30/2022 1002   HCT 44.1 03/31/2021 0909   PLT 271.0 06/30/2022 1002   PLT 249 03/31/2021 0909   MCV 90.9 06/30/2022 1002   MCV 93 03/31/2021 0909   MCH 30.0 03/31/2021 0909   MCHC  33.7 06/30/2022 1002   RDW 14.5 06/30/2022 1002   RDW 13.6 03/31/2021 0909   Iron/TIBC/Ferritin/ %Sat    Component Value Date/Time   IRON 87 03/31/2021 0909   Lipid Panel     Component Value Date/Time   CHOL 224 (H) 06/30/2022 1002   CHOL 216 (H) 03/31/2021 0909   TRIG 117.0 06/30/2022 1002   HDL 62.70 06/30/2022 1002   HDL 64 03/31/2021 0909   CHOLHDL 4 06/30/2022 1002   VLDL 23.4 06/30/2022 1002   LDLCALC 138 (H) 06/30/2022 1002   LDLCALC 133 (H) 03/31/2021 0909   Hepatic Function Panel     Component Value Date/Time   PROT 6.7 06/30/2022 1002   PROT 6.4 03/31/2021 0909   ALBUMIN 4.6 06/30/2022 1002   ALBUMIN 4.8  03/31/2021 0909   AST 16 06/30/2022 1002   ALT 17 06/30/2022 1002   ALKPHOS 78 06/30/2022 1002   BILITOT 0.5 06/30/2022 1002   BILITOT 0.3 03/31/2021 0909      Component Value Date/Time   TSH 1.31 06/30/2022 1002     Assessment and Plan:   Prediabetes Assessment & Plan: Lab Results  Component Value Date   HGBA1C 6.0 (H) 06/30/2022   She has been in the prediabetic range, at risk for T2DM with a BMI of 41, a Visceral Fat rating of 21 and a sedentary lifestyle.  She has never been on metformin.    Look for improvements in prediabetic with weight reduction and dietary changes.   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  She has been working on dietary changes but is limited with exercise due to episodic dizziness.  F/u with PCP for management Look for improvements with healthy dietary changes and weight loss   Dizziness Assessment & Plan: She describes episodic dizziness that is related to quick head movements, on and off > 6 years. She had a neuro workup for this back then but has now had to limit her enjoyable activities like fishing and has fear of going for walking due to this.  This has only contributed to more weight gain and a worse quality of life.    Recommended setting up a follow up visit with ENT for eval and treatment         Obesity Treatment / Action Plan:  Patient will work on garnering support from family and friends to begin weight loss journey. Will work on eliminating or reducing the presence of highly palatable, calorie dense foods in the home. Will complete  provided nutritional and psychosocial assessment questionnaire before the next appointment. Will be scheduled for indirect calorimetry to determine resting energy expenditure in a fasting state.  This will allow [place] to create a reduced calorie, high-protein meal plan to promote loss of fat mass while preserving muscle mass. Will think about ideas on how to incorporate physical activity into their daily routine. Counseled on the health benefits of losing 5%-15% of total body weight. Was counseled on nutritional approaches to weight loss and benefits of reducing processed foods and consuming plant-based foods and high quality protein as part of nutritional weight management.  Was counseled on pharmacotherapy and role as an adjunct in weight management.   Obesity Education Performed Today:  She was weighed on the bioimpedance scale and results were discussed and documented in the synopsis.  We discussed obesity as a disease and the importance of a more detailed evaluation of all the factors contributing to the disease.  We discussed the importance of long term lifestyle changes which include nutrition, exercise and behavioral modifications as well as the importance of customizing this to her specific health and social needs.  We discussed the benefits of reaching a healthier weight to alleviate the symptoms of existing conditions and reduce the risks of the biomechanical, metabolic and psychological effects of obesity.  Sandra Lara appears to be in the action stage of change and states they are ready to start intensive lifestyle modifications and behavioral modifications.  30 minutes was spent today on this visit including the above counseling, pre-visit chart review, and post-visit documentation.  Reviewed by clinician on day of visit: allergies, medications, problem list, medical history, surgical history, family history, social history, and previous encounter notes pertinent to obesity  diagnosis.    Seymour Bars, D.O. DABFM, DABOM [facility] [phone] [place], [place] [address] [phone]

## 2022-12-28 ENCOUNTER — Ambulatory Visit: Payer: No Typology Code available for payment source

## 2023-01-04 ENCOUNTER — Ambulatory Visit: Payer: No Typology Code available for payment source

## 2023-01-13 ENCOUNTER — Ambulatory Visit (INDEPENDENT_AMBULATORY_CARE_PROVIDER_SITE_OTHER): Payer: PRIVATE HEALTH INSURANCE | Admitting: Family Medicine

## 2023-01-14 ENCOUNTER — Ambulatory Visit: Payer: No Typology Code available for payment source | Admitting: Family Medicine

## 2023-01-14 ENCOUNTER — Encounter: Payer: Self-pay | Admitting: Family Medicine

## 2023-01-14 VITALS — BP 142/83 | HR 64 | Temp 97.9°F | Ht <= 58 in | Wt 194.0 lb

## 2023-01-14 DIAGNOSIS — E66813 Obesity, class 3: Secondary | ICD-10-CM

## 2023-01-14 DIAGNOSIS — R5383 Other fatigue: Secondary | ICD-10-CM | POA: Diagnosis not present

## 2023-01-14 DIAGNOSIS — Z1331 Encounter for screening for depression: Secondary | ICD-10-CM

## 2023-01-14 DIAGNOSIS — R0602 Shortness of breath: Secondary | ICD-10-CM | POA: Diagnosis not present

## 2023-01-14 DIAGNOSIS — G4733 Obstructive sleep apnea (adult) (pediatric): Secondary | ICD-10-CM

## 2023-01-14 DIAGNOSIS — E559 Vitamin D deficiency, unspecified: Secondary | ICD-10-CM | POA: Diagnosis not present

## 2023-01-14 DIAGNOSIS — Z6841 Body Mass Index (BMI) 40.0 and over, adult: Secondary | ICD-10-CM

## 2023-01-14 DIAGNOSIS — R7303 Prediabetes: Secondary | ICD-10-CM

## 2023-01-14 DIAGNOSIS — E662 Morbid (severe) obesity with alveolar hypoventilation: Secondary | ICD-10-CM

## 2023-01-14 DIAGNOSIS — K219 Gastro-esophageal reflux disease without esophagitis: Secondary | ICD-10-CM | POA: Diagnosis not present

## 2023-01-14 NOTE — Assessment & Plan Note (Signed)
Last vitamin D Lab Results  Component Value Date   VD25OH 39.3 03/31/2021   She reports a history of vitamin D deficiency.  She is currently not on a vitamin D supplement and has some complaints of fatigue.  Recheck vitamin D level today

## 2023-01-14 NOTE — Assessment & Plan Note (Signed)
Lab Results  Component Value Date   HGBA1C 6.0 (H) 06/30/2022   She has a history of prediabetes.  She worries about developing type 2 diabetes over time.  She is at risk for type 2 diabetes with an elevated visceral fat rating, BMI of 41.9.  Begin prescribed dietary plan.  This reduces added sugar and excess starches.  We discussed a plan to increase regular exercise.  Will evaluate fasting insulin and A1c today.  Consider use of metformin.

## 2023-01-14 NOTE — Assessment & Plan Note (Signed)
Currently on omeprazole 20 mg once daily.  This seems to be controlling her heartburn well.  Look for improvements in heartburn with weight reduction.

## 2023-01-14 NOTE — Assessment & Plan Note (Signed)
She has done well on CPAP  for 10 years Averaging only 5 hrs of sleep at night mostly due to screen time at night  We discussed the importance of increasing sleep time to 7 to 8 hours at night wearing CPAP to improve energy levels.  We discussed the importance of adequate sleep and the role of obesity management.  Begin active plan for weight reduction for the treatment of OSA.

## 2023-01-14 NOTE — Progress Notes (Signed)
At a Glance:  Vitals Temp: 97.9 F (36.6 C) BP: (!) 142/83 Pulse Rate: 64 SpO2: 97 %   Anthropometric Measurements Height: 4\' 9"  (1.448 m) Weight: 194 lb (88 kg) BMI (Calculated): 41.97 Starting Weight: 194lb Peak Weight: 211lb Waist Measurement : 45 inches   Body Composition  Body Fat %: 49.1 % Fat Mass (lbs): 95.6 lbs Muscle Mass (lbs): 94 lbs Total Body Water (lbs): 72.4 lbs Visceral Fat Rating : 17   Other Clinical Data RMR: [id number] Fasting: Yes Labs: Yes Today's Visit #: 1 Starting Date: 01/14/23   EKG: Normal sinus rhythm, rate 65 BPM   Indirect Calorimeter completed today shows a VO2 of 194 and a REE of 1339.  Her calculated basal metabolic rate is 5638 thus her basal metabolic rate is worse than expected.  Chief Complaint:  Obesity   Subjective:  Sandra Lara (MR# [id number]) is a 62 y.o. female who presents for evaluation and treatment of obesity and related comorbidities.   Sandra Lara is currently in the action stage of change and ready to dedicate time achieving and maintaining a healthier weight. Sandra Lara is interested in becoming our patient and working on intensive lifestyle modifications including (but not limited to) diet and exercise for weight loss.  Sandra Lara has been struggling with her weight. She has been unsuccessful in either losing weight, maintaining weight loss, or reaching her healthy weight goal.  Sandra Lara habits were reviewed today and are as follows.  She does well at breakfast.  She doesn't make time for lunch, has a snack late afternoon and then a bigger dinner.  She also skips lunch on the weekends.  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    She started gaining weight since early childhood. She has dealt with weight issues for most of her life. She has steadily increased her weight thru adulthood with some weight loss on diets.  She lives with her spouse.  She would like to lose 30 lb.  She cooks at home.  Denies excessive  hunger.  Admits to stress and boredom eating.    Other Fatigue Sandra Lara admits to daytime somnolence and denies waking up still tired. Patient has a history of symptoms of daytime fatigue. Sandra Lara generally gets 5 hours of sleep per night, and states that she has generally restful sleep. Snoring is present. Apneic episodes are present. Epworth Sleepiness Score is 17 . Wears CPAP x 10 years  Shortness of Breath Sandra Lara notes increasing shortness of breath with exercising and seems to be worsening over time with weight gain. She notes getting out of breath sooner with activity than she used to. This has gotten worse recently. Sandra Lara denies shortness of breath at rest or orthopnea.   Depression Screen Sandra Lara Food and Mood (modified PHQ-9) score was 12.     06/30/2022    9:28 AM  Depression screen PHQ 2/9  Decreased Interest 0  Down, Depressed, Hopeless 0  PHQ - 2 Score 0     Assessment and Plan:   Other Fatigue Sandra Lara does feel that her weight is causing her energy to be lower than it should be. Fatigue may be related to obesity, depression or many other causes. Labs will be ordered, and in the meanwhile, Sandra Lara will focus on self care including making healthy food choices, increasing physical activity and focusing on stress reduction.  Shortness of Breath Sandra Lara does feel that she gets out of breath more easily that she used to when she exercises. Sandra Lara's shortness of breath  appears to be obesity related and exercise induced. She has agreed to work on weight loss and gradually increase exercise to treat her exercise induced shortness of breath. Will continue to monitor closely.  Sandra Lara had a positive depression screening. Depression is commonly associated with obesity and often results in emotional eating behaviors. We will monitor this closely and work on CBT to help improve the non-hunger eating patterns. Referral to Psychology may be required if no improvement is seen as she  continues in our clinic.    Problem List Items Addressed This Visit     Gastroesophageal reflux disease    Currently on omeprazole 20 mg once daily.  This seems to be controlling her heartburn well.  Look for improvements in heartburn with weight reduction.      Prediabetes    Lab Results  Component Value Date   HGBA1C 6.0 (H) 06/30/2022   She has a history of prediabetes.  She worries about developing type 2 diabetes over time.  She is at risk for type 2 diabetes with an elevated visceral fat rating, BMI of 41.9.  Begin prescribed dietary plan.  This reduces added sugar and excess starches.  We discussed a plan to increase regular exercise.  Will evaluate fasting insulin and A1c today.  Consider use of metformin.      Vitamin D deficiency    Last vitamin D Lab Results  Component Value Date   VD25OH 39.3 03/31/2021   She reports a history of vitamin D deficiency.  She is currently not on a vitamin D supplement and has some complaints of fatigue.  Recheck vitamin D level today      OSA on CPAP    She has done well on CPAP  for 10 years Averaging only 5 hrs of sleep at night mostly due to screen time at night  We discussed the importance of increasing sleep time to 7 to 8 hours at night wearing CPAP to improve energy levels.  We discussed the importance of adequate sleep and the role of obesity management.  Begin active plan for weight reduction for the treatment of OSA.      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  This seems to be controlling her heartburn well.  Look for improvements in heartburn with weight reduction.      Prediabetes    Lab Results  Component Value Date   HGBA1C 6.0 (H) 06/30/2022   She has a history of prediabetes.  She worries about developing type 2 diabetes over time.  She is at risk for type 2 diabetes with an elevated visceral fat rating, BMI of 41.9.  Begin prescribed dietary plan.  This reduces added sugar and excess starches.  We discussed a plan to increase regular exercise.  Will evaluate fasting insulin and A1c today.  Consider use of metformin.      Vitamin D  deficiency    Last vitamin D Lab Results  Component Value Date   VD25OH 39.3 03/31/2021   She reports a history of vitamin D deficiency.  She is currently not on a vitamin D supplement and has some complaints of fatigue.  Recheck vitamin D level today      OSA on CPAP    She has done well on CPAP  for 10 years Averaging only 5 hrs of sleep at night mostly due to screen time at night  We discussed the importance of increasing sleep time to 7 to 8 hours at night wearing CPAP to improve energy levels.  We discussed the importance of adequate sleep and the role of obesity management.  Begin active plan for weight reduction for the treatment of OSA.      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 I recommend Miquela begin the structured treatment  plan as follows:  She has agreed to Category 1 Plan  Exercise goals: All adults should avoid inactivity. Some activity is better than none, and adults who participate in any amount of physical activity, gain some health benefits.  Behavioral modification strategies:increasing lean protein intake, decreasing simple carbohydrates, increase H2O intake, decrease liquid calories, no skipping meals, meal planning and cooking strategies, keeping healthy foods in the home, better snacking choices, planning for success, and decrease junk food   She was informed of the importance of frequent follow-up visits to maximize her success with intensive lifestyle modifications for her multiple health conditions. She was informed we would discuss her lab results at her next visit unless there is a critical issue that needs to be addressed sooner. Shantele agreed to keep her next visit at the agreed upon time to discuss these results.  Objective:  General: Cooperative, alert, well developed, in no acute distress. HEENT: Conjunctivae and lids unremarkable. Cardiovascular: Regular rhythm.  Lungs: Normal work of breathing. Neurologic: No focal deficits.   Lab Results  Component Value Date   CREATININE 0.68 06/30/2022   BUN 13 06/30/2022   NA 140 06/30/2022   K 4.1 06/30/2022   CL 105 06/30/2022   CO2 24 06/30/2022   Lab Results  Component Value Date   ALT 17 06/30/2022   AST 16 06/30/2022   GGT 19 03/31/2021   ALKPHOS 78 06/30/2022   BILITOT 0.5 06/30/2022   Lab Results  Component Value Date   HGBA1C 6.0 (H) 06/30/2022   HGBA1C 5.9 (H) 03/31/2021   HGBA1C 5.7 (H) 09/17/2020   HGBA1C 5.9 (H) 03/21/2020   No results found for: "INSULIN" Lab Results  Component Value Date   TSH 1.31 06/30/2022   Lab Results  Component Value Date   CHOL 224 (H) 06/30/2022   HDL 62.70 06/30/2022   LDLCALC 138 (H) 06/30/2022   TRIG 117.0 06/30/2022   CHOLHDL 4 06/30/2022   Lab Results  Component Value Date  WBC 5.0 06/30/2022   HGB 14.9 06/30/2022   HCT 44.2 06/30/2022   MCV 90.9 06/30/2022   PLT 271.0 06/30/2022   Lab Results  Component Value Date   IRON 87 03/31/2021    Attestation Statements:  Reviewed by clinician on day of visit: allergies, medications, problem list, medical history, surgical history, family history, social history, and previous encounter notes.  Time spent on visit including pre-visit chart review and post-visit charting and care was 50 minutes.   Glennis Brink, DO

## 2023-01-15 LAB — COMPREHENSIVE METABOLIC PANEL
ALT: 19 [IU]/L (ref 0–32)
AST: 18 [IU]/L (ref 0–40)
Albumin: 4.3 g/dL (ref 3.9–4.9)
Alkaline Phosphatase: 83 [IU]/L (ref 44–121)
BUN/Creatinine Ratio: 22 (ref 12–28)
BUN: 13 mg/dL (ref 8–27)
Bilirubin Total: 0.3 mg/dL (ref 0.0–1.2)
CO2: 23 mmol/L (ref 20–29)
Calcium: 9.8 mg/dL (ref 8.7–10.3)
Chloride: 103 mmol/L (ref 96–106)
Creatinine, Ser: 0.6 mg/dL (ref 0.57–1.00)
Globulin, Total: 1.9 g/dL (ref 1.5–4.5)
Glucose: 100 mg/dL — ABNORMAL HIGH (ref 70–99)
Potassium: 4.4 mmol/L (ref 3.5–5.2)
Sodium: 140 mmol/L (ref 134–144)
Total Protein: 6.2 g/dL (ref 6.0–8.5)
eGFR: 101 mL/min/{1.73_m2} (ref >59)

## 2023-01-15 LAB — VITAMIN B12: Vitamin B-12: 368 pg/mL (ref 232–1245)

## 2023-01-15 LAB — HEMOGLOBIN A1C
Est. average glucose Bld gHb Est-mCnc: 126 mg/dL
Hgb A1c MFr Bld: 6 % — ABNORMAL HIGH (ref 4.8–5.6)

## 2023-01-15 LAB — VITAMIN D 25 HYDROXY (VIT D DEFICIENCY, FRACTURES): Vit D, 25-Hydroxy: 63.9 ng/mL (ref 30.0–100.0)

## 2023-01-15 LAB — INSULIN, RANDOM: INSULIN: 10 u[IU]/mL (ref 2.6–24.9)

## 2023-01-15 LAB — FOLATE: Folate: 11.1 ng/mL (ref >3.0)

## 2023-01-28 ENCOUNTER — Ambulatory Visit (INDEPENDENT_AMBULATORY_CARE_PROVIDER_SITE_OTHER): Payer: PRIVATE HEALTH INSURANCE | Admitting: Family Medicine

## 2023-01-28 ENCOUNTER — Encounter: Payer: Self-pay | Admitting: Family Medicine

## 2023-01-28 ENCOUNTER — Ambulatory Visit: Payer: No Typology Code available for payment source | Admitting: Family Medicine

## 2023-01-28 VITALS — BP 137/86 | HR 64 | Temp 98.1°F | Ht <= 58 in | Wt 187.0 lb

## 2023-01-28 DIAGNOSIS — G4733 Obstructive sleep apnea (adult) (pediatric): Secondary | ICD-10-CM | POA: Diagnosis not present

## 2023-01-28 DIAGNOSIS — R7303 Prediabetes: Secondary | ICD-10-CM

## 2023-01-28 DIAGNOSIS — E66813 Obesity, class 3: Secondary | ICD-10-CM

## 2023-01-28 DIAGNOSIS — Z6841 Body Mass Index (BMI) 40.0 and over, adult: Secondary | ICD-10-CM | POA: Diagnosis not present

## 2023-01-28 NOTE — Progress Notes (Signed)
Office: [phone]  /  Fax: [phone]  WEIGHT SUMMARY AND BIOMETRICS  Starting Date: 01/14/23  Starting Weight: 194lb   Weight Lost Since Last Visit: 7lb   Vitals Temp: 98.1 F (36.7 C) BP: 137/86 Pulse Rate: 64 SpO2: 97 %   Body Composition  Body Fat %: 40.9 % Fat Mass (lbs): 76.8 lbs Muscle Mass (lbs): 105.4 lbs Total Body Water (lbs): 72.2 lbs Visceral Fat Rating : 14    HPI  Chief Complaint: OBESITY  Sandra Lara is here to discuss her progress with her obesity treatment plan. She is on the the Category 1 Plan and states she is following her eating plan approximately 95 % of the time. She states she is exercising 15 minutes 2 times per week.   Interval History:  Since last office visit she is down 7 lb She is tracking using calories using Noom getting ~900  cal/ day and 90 g of protein daily She is  feeling adequately full at the end  her meals and denies cravings She is doing better eating on a schedule Continue to have L knee pain that limits walking time She does not eat out She is getting in plenty of water  Pharmacotherapy: none  PHYSICAL EXAM:  Blood pressure 137/86, pulse 64, temperature 98.1 F (36.7 C), height 4\' 9"  (1.448 m), weight 187 lb (84.8 kg), SpO2 97%. Body mass index is 40.47 kg/m.  General: She is overweight, cooperative, alert, well developed, and in no acute distress. PSYCH: Has normal mood, affect and thought process.   Lungs: Normal breathing effort, no conversational dyspnea.   ASSESSMENT AND PLAN  TREATMENT PLAN FOR OBESITY:  Recommended Dietary Goals  Sandra Lara is currently in the action stage of change. As such, her goal is to continue weight management plan. She has agreed to keeping a food journal and adhering to recommended goals of [phone] calories and 90+ g of  protein.  Behavioral Intervention  We discussed the following Behavioral Modification Strategies today: increasing lean protein intake to established  goals, increasing vegetables, increasing lower glycemic fruits, increasing fiber rich foods, work on meal planning and preparation, work on tracking and journaling calories using tracking application, keeping healthy foods at home, avoiding temptations and identifying enticing environmental cues, planning for success, and continue to work on maintaining a reduced calorie state, getting the recommended amount of protein, incorporating whole foods, making healthy choices, staying well hydrated and practicing mindfulness when eating..  Additional resources provided today: NA  Recommended Physical Activity Goals  Sandra Lara has been advised to work up to 150 minutes of moderate intensity aerobic activity a week and strengthening exercises 2-3 times per week for cardiovascular health, weight loss maintenance and preservation of muscle mass.   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  Continue a low sugar diet focused on lean protein and fiber with meals Short walks as tolerated, adding in water exercise 2 days/ wk recommended  Class 3 severe obesity due to excess calories with body mass index (BMI) of 40.0 to 44.9 in adult, unspecified whether serious comorbidity present (HCC)  OSA on CPAP Assessment & Plan: Doing well on CPAP nightly Averages 7-8 hrs of sleep at night with CPAP  Look for OSA improvements with weight loss       She was informed of the importance  of frequent follow up visits to maximize her success with intensive lifestyle modifications for her multiple health conditions.   ATTESTASTION STATEMENTS:  Reviewed by clinician on day of visit: allergies, medications, problem list, medical history, surgical history, family history, social history, and previous encounter notes pertinent to obesity diagnosis.   I have personally spent 30 minutes total time today in preparation, patient care, nutritional counseling and documentation for this visit, including the following: review of clinical lab tests; review of medical tests/procedures/services.      Glennis Brink, DO DABFM, DABOM [facility] [address] [place], [place] [address] [phone]

## 2023-01-28 NOTE — Assessment & Plan Note (Signed)
Lab Results  Component Value Date   HGBA1C 6.0 (H) 01/14/2023   Reviewed lab results with patient She has been stable with her A1c x 2 years with a + fam hx of DM2 She has been doing well over the past 2 weeks with dietary logging and is doing well with weight reduction Exercise has been limited due to L knee DJD pain  Continue a low sugar diet focused on lean protein and fiber with meals Short walks as tolerated, adding in water exercise 2 days/ wk recommended

## 2023-01-28 NOTE — Assessment & Plan Note (Signed)
Doing well on CPAP nightly Averages 7-8 hrs of sleep at night with CPAP  Look for OSA improvements with weight loss

## 2023-01-28 NOTE — Patient Instructions (Signed)
Check out [place]  Aim for [phone] cal/ day This should include 80-110 g of protein daily  Limit high sugar items You can have 2 fruit servings per day  Continue to work on eating on a schedule

## 2023-02-18 ENCOUNTER — Encounter: Payer: Self-pay | Admitting: Family Medicine

## 2023-02-18 ENCOUNTER — Ambulatory Visit: Payer: No Typology Code available for payment source | Admitting: Family Medicine

## 2023-02-18 VITALS — BP 120/78 | HR 64 | Temp 98.0°F | Ht <= 58 in | Wt 183.0 lb

## 2023-02-18 DIAGNOSIS — E785 Hyperlipidemia, unspecified: Secondary | ICD-10-CM | POA: Diagnosis not present

## 2023-02-18 DIAGNOSIS — G4733 Obstructive sleep apnea (adult) (pediatric): Secondary | ICD-10-CM

## 2023-02-18 DIAGNOSIS — R7303 Prediabetes: Secondary | ICD-10-CM

## 2023-02-18 DIAGNOSIS — Z6839 Body mass index (BMI) 39.0-39.9, adult: Secondary | ICD-10-CM

## 2023-02-18 DIAGNOSIS — E66812 Obesity, class 2: Secondary | ICD-10-CM

## 2023-02-18 NOTE — Progress Notes (Signed)
Office: [phone]  /  Fax: [phone]  WEIGHT SUMMARY AND BIOMETRICS  Starting Date: 01/14/23  Starting Weight: 194lb   Weight Lost Since Last Visit: 4lb   Vitals Temp: 98 F (36.7 C) BP: 120/78 Pulse Rate: 64 SpO2: 99 %   Body Composition  Body Fat %: 41.3 % Fat Mass (lbs): 75.6 lbs Muscle Mass (lbs): 102.2 lbs Total Body Water (lbs): 70.2 lbs Visceral Fat Rating : 13    HPI  Chief Complaint: OBESITY  Sandra Lara is here to discuss her progress with her obesity treatment plan. She is on the the Category 1 Plan and states she is following her eating plan approximately 95 % of the time. She states she is exercising 0 minutes 0 times per week.   Interval History:  Since last office visit she is down 4 lb This gives her a net weight loss 11 lb in the past month of medical This is a 5.6% TBW Loss since starting our program She is tracking her calories getting in ~850 cal/ day and aims for 90 g of protein intake daily She has not yet added in exercise but plans to check out [facility] for water exercise  She would like to hit a target weight of 150 lb by May She denies hunger or cravings  Pharmacotherapy: none  PHYSICAL EXAM:  Blood pressure 120/78, pulse 64, temperature 98 F (36.7 C), height 4\' 9"  (1.448 m), weight 183 lb (83 kg), SpO2 99%. Body mass index is 39.6 kg/m.  General: She is overweight, cooperative, alert, well developed, and in no acute distress. PSYCH: Has normal mood, affect and thought process.   Lungs: Normal breathing effort, no conversational dyspnea.   ASSESSMENT AND PLAN  TREATMENT PLAN FOR OBESITY:  Recommended Dietary Goals  Noel is currently in the action stage of change. As such, her goal is to continue weight management plan. She has agreed to keeping a food journal and adhering to recommended goals of 800- 1100 calories and 80+ g  protein.  Behavioral Intervention  We discussed the following Behavioral Modification  Strategies today: increasing lean protein intake to established goals, increasing vegetables, increasing water intake , work on meal planning and preparation, work on tracking and journaling calories using tracking application, reading food labels , keeping healthy foods at home, practice mindfulness eating and understand the difference between hunger signals and cravings, avoiding temptations and identifying enticing environmental cues, and continue to work on maintaining a reduced calorie state, getting the recommended amount of protein, incorporating whole foods, making healthy choices, staying well hydrated and practicing mindfulness when eating..  Additional resources provided today: NA  Recommended Physical Activity Goals  Orabelle has been advised to work up to 150 minutes of moderate intensity aerobic activity a week and strengthening exercises 2-3 times per week for cardiovascular health, weight loss maintenance and preservation of muscle mass.   She has agreed to Patient will begin to exercise plan to add in water exercise at least once a week starting in Jan  Pharmacotherapy changes for the treatment of obesity: none. We reviewed MOA and website for [employer] in case she hits a weight loss plateau  ASSOCIATED CONDITIONS ADDRESSED TODAY  Prediabetes Assessment & Plan: Lab Results  Component Value Date   HGBA1C 6.0 (H) 01/14/2023   She is doing well with a 5.6% TBW loss in the past month with dietary logging and creation of a caloric deficit, reducing sugar and starches.  She is not on metformin.  Continue work on  diet and exercise changes Plan to repeat A1c in the next 3 mos Avoid products with > 8 g of added sugar per serving   Class 2 severe obesity due to excess calories with serious comorbidity and body mass index (BMI) of 39.0 to 39.9 in adult (HCC)  OSA on CPAP Assessment & Plan: She is doing well with use of CPAP at night  Continue active plan for weight reduction Aim  for 7-8 hrs of sleep at night   Hyperlipidemia, unspecified hyperlipidemia type Assessment & Plan: Lab Results  Component Value Date   CHOL 224 (H) 06/30/2022   HDL 62.70 06/30/2022   LDLCALC 138 (H) 06/30/2022   TRIG 117.0 06/30/2022   CHOLHDL 4 06/30/2022   She reports a + fam hx of HLD but is not on lipid lowering medication She has been working on weight loss with a low saturated fat diet  Repeat lipids in the next 3 mos The 10-year ASCVD risk score (Arnett DK, et al., 2019) is: 3.6%   Values used to calculate the score:     Age: 62 years     Sex: Female     Is Non-Hispanic African American: No     Diabetic: No     Tobacco smoker: No     Systolic Blood Pressure: 120 mmHg     Is BP treated: No     HDL Cholesterol: 62.7 mg/dL     Total Cholesterol: 224 mg/dL        She was informed of the importance of frequent follow up visits to maximize her success with intensive lifestyle modifications for her multiple health conditions.   ATTESTASTION STATEMENTS:  Reviewed by clinician on day of visit: allergies, medications, problem list, medical history, surgical history, family history, social history, and previous encounter notes pertinent to obesity diagnosis.   I have personally spent 30 minutes total time today in preparation, patient care, nutritional counseling and documentation for this visit, including the following: review of clinical lab tests; review of medical tests/procedures/services.      Glennis Brink, DO DABFM, DABOM [facility] [address] [place], [place] [address] [phone]

## 2023-02-18 NOTE — Assessment & Plan Note (Signed)
Lab Results  Component Value Date   CHOL 224 (H) 06/30/2022   HDL 62.70 06/30/2022   LDLCALC 138 (H) 06/30/2022   TRIG 117.0 06/30/2022   CHOLHDL 4 06/30/2022   She reports a + fam hx of HLD but is not on lipid lowering medication She has been working on weight loss with a low saturated fat diet  Repeat lipids in the next 3 mos The 10-year ASCVD risk score (Arnett DK, et al., 2019) is: 3.6%   Values used to calculate the score:     Age: 62 years     Sex: Female     Is Non-Hispanic African American: No     Diabetic: No     Tobacco smoker: No     Systolic Blood Pressure: 120 mmHg     Is BP treated: No     HDL Cholesterol: 62.7 mg/dL     Total Cholesterol: 224 mg/dL

## 2023-02-18 NOTE — Assessment & Plan Note (Signed)
Lab Results  Component Value Date   HGBA1C 6.0 (H) 01/14/2023   She is doing well with a 5.6% TBW loss in the past month with dietary logging and creation of a caloric deficit, reducing sugar and starches.  She is not on metformin.  Continue work on diet and exercise changes Plan to repeat A1c in the next 3 mos Avoid products with > 8 g of added sugar per serving

## 2023-02-18 NOTE — Assessment & Plan Note (Signed)
She is doing well with use of CPAP at night  Continue active plan for weight reduction Aim for 7-8 hrs of sleep at night

## 2023-03-16 ENCOUNTER — Ambulatory Visit: Payer: No Typology Code available for payment source | Admitting: Family Medicine

## 2023-03-25 ENCOUNTER — Encounter: Payer: Self-pay | Admitting: Family Medicine

## 2023-03-25 ENCOUNTER — Ambulatory Visit: Payer: No Typology Code available for payment source | Admitting: Family Medicine

## 2023-03-25 VITALS — BP 132/85 | HR 70 | Temp 98.5°F | Ht <= 58 in | Wt 177.0 lb

## 2023-03-25 DIAGNOSIS — E66812 Obesity, class 2: Secondary | ICD-10-CM | POA: Diagnosis not present

## 2023-03-25 DIAGNOSIS — R7303 Prediabetes: Secondary | ICD-10-CM | POA: Diagnosis not present

## 2023-03-25 DIAGNOSIS — Z6838 Body mass index (BMI) 38.0-38.9, adult: Secondary | ICD-10-CM

## 2023-03-25 DIAGNOSIS — E785 Hyperlipidemia, unspecified: Secondary | ICD-10-CM | POA: Diagnosis not present

## 2023-03-25 NOTE — Assessment & Plan Note (Signed)
Lab Results  Component Value Date   CHOL 224 (H) 06/30/2022   HDL 62.70 06/30/2022   LDLCALC 138 (H) 06/30/2022   TRIG 117.0 06/30/2022   CHOLHDL 4 06/30/2022   She is not on lipid lowering medications.  She is actively working on a reduced kcal prescribed diet which is low in saturated fat and is down 8.7% TBW in the past 2 mos of medically supervised weight management.  Plan to have FLP rechecked with Dr Mardelle Matte at next CPE

## 2023-03-25 NOTE — Assessment & Plan Note (Signed)
Lab Results  Component Value Date   HGBA1C 6.0 (H) 01/14/2023   She is doing well with weight reduction on a reduced kcal prescribed diet and has been able to increase walking time with a reduction in L knee pain due to weight loss.  She is not on medication for prediabetes.   She has reduced intake of added sugar and starches.  She is down 8.7% TBW in 2 mos of medically supervised weight management.  Continue current lifestyle changes Plan to add in weight training 1-2 x a week at [facility] in the next month Repeat A1c in 2 mos

## 2023-03-25 NOTE — Progress Notes (Signed)
Office: [phone]  /  Fax: [phone]  WEIGHT SUMMARY AND BIOMETRICS  Starting Date: 01/14/23  Starting Weight: 194lb   Weight Lost Since Last Visit: 6lb   Vitals Temp: 98.5 F (36.9 C) BP: 132/85 Pulse Rate: 70 SpO2: 97 %   Body Composition  Body Fat %: 40.5 % Fat Mass (lbs): 72 lbs Muscle Mass (lbs): 100.2 lbs Total Body Water (lbs): 69.6 lbs Visceral Fat Rating : 13    HPI  Chief Complaint: OBESITY  Sandra Lara is here to discuss her progress with her obesity treatment plan. She is on the the Category 1 Plan and states she is following her eating plan approximately 99 % of the time. She states she is walking on the treadmill for 30 minutes 3-4 times per week.  Interval History:  Since last office visit she is down 6 lb This gives her a net weight loss of 17 lb in 2 mos She is holding onto her lean body mass This is a 8.7% TBW loss in the past 2 mos She has a good support system Her L knee pain is improving with weight loss and she has been able to add in treadmill walking and plans to start at [employer] soon She is working remote for the Winter  Pharmacotherapy: none  PHYSICAL EXAM:  Blood pressure 132/85, pulse 70, temperature 98.5 F (36.9 C), height 4\' 9"  (1.448 m), weight 177 lb (80.3 kg), SpO2 97%. Body mass index is 38.3 kg/m.  General: She is overweight, cooperative, alert, well developed, and in no acute distress. PSYCH: Has normal mood, affect and thought process.   Lungs: Normal breathing effort, no conversational dyspnea.   ASSESSMENT AND PLAN  TREATMENT PLAN FOR OBESITY:  Recommended Dietary Goals  Sandra Lara is currently in the action stage of change. As such, her goal is to continue weight management plan. She has agreed to the Category 1 Plan.  Behavioral Intervention  We discussed the following Behavioral Modification Strategies today: increasing lean protein intake to established goals, increasing vegetables, increasing lower  glycemic fruits, increasing fiber rich foods, increasing water intake , keeping healthy foods at home, planning for success, and continue to work on maintaining a reduced calorie state, getting the recommended amount of protein, incorporating whole foods, making healthy choices, staying well hydrated and practicing mindfulness when eating..  Additional resources provided today: NA  Recommended Physical Activity Goals  [name] has been advised to work up to 150 minutes of moderate intensity aerobic activity a week and strengthening exercises 2-3 times per week for cardiovascular health, weight loss maintenance and preservation of muscle mass.   She has agreed to Start aerobic activity with a goal of 150 minutes a week at moderate intensity.  and Increase the intensity, frequency or duration of strengthening exercises   Pharmacotherapy changes for the treatment of obesity: none  ASSOCIATED CONDITIONS ADDRESSED TODAY  Prediabetes Assessment & Plan: Lab Results  Component Value Date   HGBA1C 6.0 (H) 01/14/2023   She is doing well with weight reduction on a reduced kcal prescribed diet and has been able to increase walking time with a reduction in L knee pain due to weight loss.  She is not on medication for prediabetes.   She has reduced intake of added sugar and starches.  She is down 8.7% TBW in 2 mos of medically supervised weight management.  Continue current lifestyle changes Plan to add in weight training 1-2 x a week at [facility] in the next month Repeat A1c in  2 mos   Class 2 severe obesity due to excess calories with serious comorbidity and body mass index (BMI) of 38.0 to 38.9 in adult [facility])  Hyperlipidemia, unspecified hyperlipidemia type Assessment & Plan: Lab Results  Component Value Date   CHOL 224 (H) 06/30/2022   HDL 62.70 06/30/2022   LDLCALC 138 (H) 06/30/2022   TRIG 117.0 06/30/2022   CHOLHDL 4 06/30/2022   She is not on lipid lowering medications.  She is actively  working on a reduced kcal prescribed diet which is low in saturated fat and is down 8.7% TBW in the past 2 mos of medically supervised weight management.  Plan to have FLP rechecked with Dr Mardelle Matte at next CPE       She was informed of the importance of frequent follow up visits to maximize her success with intensive lifestyle modifications for her multiple health conditions.   ATTESTASTION STATEMENTS:  Reviewed by clinician on day of visit: allergies, medications, problem list, medical history, surgical history, family history, social history, and previous encounter notes pertinent to obesity diagnosis.   I have personally spent 30 minutes total time today in preparation, patient care, nutritional counseling and documentation for this visit, including the following: review of clinical lab tests; review of medical tests/procedures/services.      Glennis Brink, DO DABFM, DABOM [facility] [address] [place], [place] [address] [phone]

## 2023-04-16 LAB — HM MAMMOGRAPHY

## 2023-04-20 ENCOUNTER — Encounter: Payer: Self-pay | Admitting: Family Medicine

## 2023-04-27 ENCOUNTER — Ambulatory Visit: Payer: No Typology Code available for payment source | Admitting: Family Medicine

## 2023-05-11 ENCOUNTER — Ambulatory Visit: Payer: No Typology Code available for payment source | Admitting: Family Medicine

## 2023-05-11 ENCOUNTER — Encounter: Payer: Self-pay | Admitting: Family Medicine

## 2023-05-11 VITALS — BP 133/85 | HR 72 | Temp 98.6°F | Ht <= 58 in | Wt 170.0 lb

## 2023-05-11 DIAGNOSIS — F439 Reaction to severe stress, unspecified: Secondary | ICD-10-CM | POA: Insufficient documentation

## 2023-05-11 DIAGNOSIS — R7303 Prediabetes: Secondary | ICD-10-CM

## 2023-05-11 DIAGNOSIS — E66812 Obesity, class 2: Secondary | ICD-10-CM | POA: Diagnosis not present

## 2023-05-11 DIAGNOSIS — Z6836 Body mass index (BMI) 36.0-36.9, adult: Secondary | ICD-10-CM

## 2023-05-11 NOTE — Progress Notes (Signed)
 Office: [phone]  /  Fax: [phone]  WEIGHT SUMMARY AND BIOMETRICS  Starting Date: 01/14/23  Starting Weight: 194lb   Weight Lost Since Last Visit: 7lb   Vitals Temp: 98.6 F (37 C) BP: 133/85 Pulse Rate: 72 SpO2: 97 %   Body Composition  Body Fat %: 40.3 % Fat Mass (lbs): 68.8 lbs Muscle Mass (lbs): 96.8 lbs Total Body Water (lbs): 67.8 lbs Visceral Fat Rating : 12     HPI  Chief Complaint: OBESITY  Sandra Lara is here to discuss her progress with her obesity treatment plan. She is on the the Category 1 Plan and states she is following her eating plan approximately 100 % of the time. She states she is exercising 0 minutes 0 times per week.   Interval History:  Since last office visit she is down 7 lb She is down 3.4 pounds of muscle mass and down 3.2 pounds of body fat since her last visit Stress levels have been higher-- her dad broke his hip and she has been driving out to help him He will be moving to a SNF Her sibling have been helping out with her dad She did eat off plan while traveling She is getting back on plan after feeling the inflammation from poor eating habits She has a net weight loss of 24 lb in 3 mos This is a 12.3% TBW loss without use of antiobesity medication in 3 months L knee pain is improving She is fitting in smaller sizes  Additional breakfast and lunch handouts given She is thinking about her ideas for exercise  Pharmacotherapy: none  PHYSICAL EXAM:  Blood pressure 133/85, pulse 72, temperature 98.6 F (37 C), height 4\' 9"  (1.448 m), weight 170 lb (77.1 kg), SpO2 97%. Body mass index is 36.79 kg/m.  General: She is overweight, cooperative, alert, well developed, and in no acute distress. PSYCH: Has normal mood, affect and thought process.   Lungs: Normal breathing effort, no conversational dyspnea.   ASSESSMENT AND PLAN  TREATMENT PLAN FOR OBESITY:  Recommended Dietary Goals  Boots is currently in the action  stage of change. As such, her goal is to continue weight management plan. She has agreed to the Category 1 Plan.  Behavioral Intervention  We discussed the following Behavioral Modification Strategies today: increasing lean protein intake to established goals, increasing fiber rich foods, increasing water intake , work on meal planning and preparation, keeping healthy foods at home, identifying sources and decreasing liquid calories, practice mindfulness eating and understand the difference between hunger signals and cravings, work on managing stress, creating time for self-care and relaxation, avoiding temptations and identifying enticing environmental cues, continue to work on implementation of reduced calorie nutritional plan, and continue to work on maintaining a reduced calorie state, getting the recommended amount of protein, incorporating whole foods, making healthy choices, staying well hydrated and practicing mindfulness when eating..  Additional resources provided today: NA  Recommended Physical Activity Goals  Alicya has been advised to work up to 150 minutes of moderate intensity aerobic activity a week and strengthening exercises 2-3 times per week for cardiovascular health, weight loss maintenance and preservation of muscle mass.   She has agreed to Think about enjoyable ways to increase daily physical activity and overcoming barriers to exercise and Increase physical activity in their day and reduce sedentary time (increase NEAT). Plan to increase walking time and look at both gym options and swimming for the spring and summer months  Pharmacotherapy changes for the treatment of  obesity: None  ASSOCIATED CONDITIONS ADDRESSED TODAY  Situational stress New.  Stress levels have increased since her last visit due to her father's fall with a hip fracture.  He will be moving to a skilled nursing facility this week.  She does have her siblings for support.  She has had to travel more to  help take care of him which has limited her time for meal planning, exercise and has led to more emotional eating and eating out.  She is already back on track.  She has a good support system.  Look for improvements over time.  Lean on family for support.  Keep junk food triggers out of sight  Class 2 severe obesity due to excess calories with serious comorbidity and body mass index (BMI) of 36.0 to 36.9 in adult [facility])  Prediabetes Lab Results  Component Value Date   HGBA1C 6.0 (H) 01/14/2023  She has done well with a 12.3% total body weight loss in the past 3 months of medically supervised weight management.  She is doing well with reducing intake of added sugar and starches.  She has plans to increase regular exercise.  Plan to recheck A1c in the next 1 to 2 months      She was informed of the importance of frequent follow up visits to maximize her success with intensive lifestyle modifications for her multiple health conditions.   ATTESTASTION STATEMENTS:  Reviewed by clinician on day of visit: allergies, medications, problem list, medical history, surgical history, family history, social history, and previous encounter notes pertinent to obesity diagnosis.   I have personally spent 30 minutes total time today in preparation, patient care, nutritional counseling and documentation for this visit, including the following: review of clinical lab tests; review of medical tests/procedures/services.      Glennis Brink, DO DABFM, DABOM [facility] [address] [place], [place] [address] [phone]

## 2023-06-10 ENCOUNTER — Ambulatory Visit: Admitting: Family Medicine

## 2023-06-10 ENCOUNTER — Encounter: Payer: Self-pay | Admitting: Family Medicine

## 2023-06-10 VITALS — BP 127/83 | HR 72 | Temp 98.4°F | Ht <= 58 in | Wt 166.0 lb

## 2023-06-10 DIAGNOSIS — R7303 Prediabetes: Secondary | ICD-10-CM

## 2023-06-10 DIAGNOSIS — F439 Reaction to severe stress, unspecified: Secondary | ICD-10-CM | POA: Diagnosis not present

## 2023-06-10 DIAGNOSIS — E66812 Obesity, class 2: Secondary | ICD-10-CM

## 2023-06-10 DIAGNOSIS — M1612 Unilateral primary osteoarthritis, left hip: Secondary | ICD-10-CM | POA: Diagnosis not present

## 2023-06-10 DIAGNOSIS — Z6835 Body mass index (BMI) 35.0-35.9, adult: Secondary | ICD-10-CM

## 2023-06-10 NOTE — Progress Notes (Signed)
 Office: [phone]  /  Fax: [phone]  WEIGHT SUMMARY AND BIOMETRICS  Starting Date: 01/14/23  Starting Weight: 194lb   Weight Lost Since Last Visit: 4lb   Vitals Temp: 98.4 F (36.9 C) BP: 127/83 Pulse Rate: 72 SpO2: 98 %   Body Composition  Body Fat %: 38.1 % Fat Mass (lbs): 63.6 lbs Muscle Mass (lbs): 98 lbs Total Body Water (lbs): 67.8 lbs Visceral Fat Rating : 12   HPI  Chief Complaint: OBESITY  Sandra Lara is here to discuss her progress with her obesity treatment plan. She is on the the Category 1 Plan and states she is following her eating plan approximately 100 % of the time. She states she is exercising 0 minutes 0 times per week.  Interval History:  Since last office visit she is down 4 lb She is up 1.2 lb of muscle mass and is down 5.2 lb of body fat since last visit This gives her a total weight loss of 28 pounds in the past 5 months  This is a 14.4% total body weight loss without use of antiobesity medication in 5 months of medically supervised weight management She is working a hybrid position Her L hip pain is getting worse and is radiating to her L knee, waking her up from sleep Exercise has been limited due to pain Denies hunger or cravings She has a good support system She is keeping junk food out of the house  she is still dealing with stress of her dad's placement in a skilled nursing facility  Pharmacotherapy: None  PHYSICAL EXAM:  Blood pressure 127/83, pulse 72, temperature 98.4 F (36.9 C), height 4\' 9"  (1.448 m), weight 166 lb (75.3 kg), SpO2 98%. Body mass index is 35.92 kg/m.  General: She is overweight, cooperative, alert, well developed, and in no acute distress. PSYCH: Has normal mood, affect and thought process.   Lungs: Normal breathing effort, no conversational dyspnea.   ASSESSMENT AND PLAN  TREATMENT PLAN FOR OBESITY:  Recommended Dietary Goals  Sandra Lara is currently in the action stage of change. As such, her  goal is to continue weight management plan. She has agreed to the Category 1 Plan.  Behavioral Intervention  We discussed the following Behavioral Modification Strategies today: increasing lean protein intake to established goals, increasing fiber rich foods, increasing water intake , work on meal planning and preparation, keeping healthy foods at home, work on managing stress, creating time for self-care and relaxation, planning for success, and continue to work on maintaining a reduced calorie state, getting the recommended amount of protein, incorporating whole foods, making healthy choices, staying well hydrated and practicing mindfulness when eating..  Additional resources provided today: NA  Recommended Physical Activity Goals  [name] has been advised to work up to 150 minutes of moderate intensity aerobic activity a week and strengthening exercises 2-3 times per week for cardiovascular health, weight loss maintenance and preservation of muscle mass.   She has agreed to Think about enjoyable ways to increase daily physical activity and overcoming barriers to exercise and Increase physical activity in their day and reduce sedentary time (increase NEAT).  Pharmacotherapy changes for the treatment of obesity: None  ASSOCIATED CONDITIONS ADDRESSED TODAY  Primary osteoarthritis of left hip Worsening Left hip pain is now radiating to the left knee.  She is scheduled for a follow-up visit with Dr. Darnell Level on 06/27/2023.  Reviewed previous x-rays obtained by Dr. Christell Constant dated 01/23/2019 showing moderate narrowing of the left hip joint.  She  has received corticosteroid injections for left greater trochanteric bursitis with only temporary relief.  She is no longer taking Celebrex but is using Tylenol as needed.  Her left hip pain is impeding her ability to exercise for additional weight reduction and is causing sleep disruption at night.  She has been able lose 28 pounds in the past 5 months of  medically supervised weight management left hip pain has not improved.  Keep upcoming visit scheduled with Dr. Christell Constant.  Class 2 severe obesity due to excess calories with serious comorbidity and body mass index (BMI) of 35.0 to 35.9 in adult [facility]  Prediabetes Lab Results  Component Value Date   HGBA1C 6.0 (H) 01/14/2023  Patient has done great with reducing intake of starches and sugar.  She has lost over 10% of her total body weight in the past 5 months of medically supervised weight management.  Exercise has been limited due to left hip pain.  She is scheduled for a follow-up visit with Dr Mardelle Matte this month to recheck A1c  Situational stress Stable.  She is still dealing with the stress of placing her dad in a skilled nursing facility.  He is having some behavior changes with altered mental status.  She has had occasional thoughts of emotional eating but has been able to practice mindfulness and is keeping junk food trigger foods out of the house.  She has a good support system.  Continue to work on stress reduction, high-quality sleep at night, good nutrition.     She was informed of the importance of frequent follow up visits to maximize her success with intensive lifestyle modifications for her multiple health conditions.   ATTESTASTION STATEMENTS:  Reviewed by clinician on day of visit: allergies, medications, problem list, medical history, surgical history, family history, social history, and previous encounter notes pertinent to obesity diagnosis.   I have personally spent 30 minutes total time today in preparation, patient care, nutritional counseling and documentation for this visit, including the following: review of clinical lab tests; review of medical tests/procedures/services.      Glennis Brink, DO DABFM, DABOM [facility] [address] [place], [place] [address] [phone]

## 2023-06-23 ENCOUNTER — Other Ambulatory Visit

## 2023-06-23 VITALS — BP 128/78 | Ht <= 58 in | Wt 172.0 lb

## 2023-06-23 DIAGNOSIS — Z0000 Encounter for general adult medical examination without abnormal findings: Secondary | ICD-10-CM

## 2023-06-23 NOTE — Progress Notes (Signed)
 Exec panel and HgbA1C

## 2023-06-24 ENCOUNTER — Telehealth: Payer: Self-pay | Admitting: Registered Nurse

## 2023-06-24 ENCOUNTER — Encounter: Payer: Self-pay | Admitting: Registered Nurse

## 2023-06-24 NOTE — Telephone Encounter (Signed)
 Patient had labs with [facility] Nov 2024 signed [name] ROI and tobacco attestation with RN Thersia Flax nonsmoker/vaper in previous 12 months.  Hgba1c 6 BP 128/78 weight 172lbs last year 124/82 Hgba1c 6 weight 194lbs.  LDL 138  Patient has had sustained weight loss 22lbs since last [name].

## 2023-06-25 NOTE — Telephone Encounter (Signed)
 NP signed BW forms and completed [employer] insurance discount requirements met for discount starting 08 Dec 2023. Patient also needs [facility] refill omeprazole  patient notified will fill 06/29/23 patient reported she had sufficient medications to not run out.

## 2023-06-29 ENCOUNTER — Ambulatory Visit: Admitting: Registered Nurse

## 2023-06-29 ENCOUNTER — Encounter: Payer: Self-pay | Admitting: Registered Nurse

## 2023-06-29 VITALS — BP 126/78

## 2023-06-29 DIAGNOSIS — K219 Gastro-esophageal reflux disease without esophagitis: Secondary | ICD-10-CM

## 2023-06-29 DIAGNOSIS — Z0000 Encounter for general adult medical examination without abnormal findings: Secondary | ICD-10-CM

## 2023-06-29 DIAGNOSIS — R7303 Prediabetes: Secondary | ICD-10-CM

## 2023-06-29 MED ORDER — OMEPRAZOLE 20 MG PO CPDR: 20.0000 mg | DELAYED_RELEASE_CAPSULE | Freq: Every day | ORAL | 3 refills | Status: AC

## 2023-06-29 MED ORDER — SALINE SPRAY 0.65 % NA SOLN: 2.0000 | NASAL | Status: DC

## 2023-06-29 NOTE — Progress Notes (Signed)
 Subjective:    Patient ID: Sandra Lara, female    DOB: Jul 30, 1960, 63 y.o.   MRN: [id number]  62y/o established caucasian female here for [facility] lab draw lipids/A1c/cmet for annual physical with RN Thersia Flax.  RN Thersia Flax was unable to obtain labs last week and scheduled with NP to draw today.  Patient also requested [facility] fill omeprazole  DR 20mg  last fill 07/03/22 not using daily.  Patient reported has been working with [facility] and very happy with her results.  She has received employer weight loss bonus in the past (a one time benefit). She thought [facility] was good but due to holistic approach with labs/nutrition likes [facility] more.   She signed [name] ROI and tobacco attestation with RN Thersia Flax (nonsmoker/vaper in previous 12 months).  She has [employer] account.  Last fill omeprazole  from [facility] 07/01/22 90 capsules.        Review of Systems  Constitutional:  Negative for chills and fever.  HENT:  Negative for trouble swallowing and voice change.   Respiratory:  Negative for cough.   Cardiovascular:  Negative for chest pain.  Gastrointestinal:  Negative for diarrhea, nausea and vomiting.  Musculoskeletal:  Negative for gait problem.  Psychiatric/Behavioral:  Negative for confusion.        Objective:   Physical Exam Vitals and nursing note reviewed.  Constitutional:      General: She is awake. She is not in acute distress.    Appearance: Normal appearance. She is well-developed, well-groomed and overweight. She is not ill-appearing, toxic-appearing or diaphoretic.  HENT:     Head: Normocephalic and atraumatic.     Jaw: There is normal jaw occlusion.     Salivary Glands: Right salivary gland is not diffusely enlarged. Left salivary gland is not diffusely enlarged.     Right Ear: Hearing and external ear normal.     Left Ear: Hearing and external ear normal.     Nose: Nose normal. No congestion or rhinorrhea.     Mouth/Throat:      Lips: Pink. No lesions.     Mouth: Mucous membranes are moist.     Pharynx: Oropharynx is clear.  Eyes:     General: Lids are normal. Vision grossly intact. Gaze aligned appropriately. Allergic shiner present. No scleral icterus.       Right eye: No discharge.        Left eye: No discharge.     Extraocular Movements: Extraocular movements intact.     Conjunctiva/sclera: Conjunctivae normal.     Pupils: Pupils are equal, round, and reactive to light.  Neck:     Trachea: Trachea normal.  Cardiovascular:     Rate and Rhythm: Normal rate and regular rhythm.     Pulses: Normal pulses.          Radial pulses are 2+ on the right side and 2+ on the left side.  Pulmonary:     Effort: Pulmonary effort is normal.     Breath sounds: Normal breath sounds and air entry. No stridor or transmitted upper airway sounds. No wheezing.     Comments: Spoke full sentences without difficulty; no cough observed in exam room Abdominal:     General: Abdomen is flat.  Musculoskeletal:        General: Normal range of motion.     Cervical back: Normal range of motion and neck supple.     Right lower leg: No edema.  Left lower leg: No edema.  Lymphadenopathy:     Head:     Right side of head: No submandibular or preauricular adenopathy.     Left side of head: No submandibular or preauricular adenopathy.     Cervical:     Right cervical: No superficial cervical adenopathy.    Left cervical: No superficial cervical adenopathy.  Skin:    General: Skin is warm and dry.     Capillary Refill: Capillary refill takes less than 2 seconds.     Coloration: Skin is not ashen, cyanotic, jaundiced, mottled, pale or sallow.     Findings: No abrasion, abscess, acne, bruising, burn, ecchymosis, erythema, signs of injury, laceration, lesion, petechiae, rash or wound.     Nails: There is no clubbing.     Comments: Face/anterior neck and ankles visually inspected  Neurological:     General: No focal deficit present.      Mental Status: She is alert and oriented to person, place, and time. Mental status is at baseline.     GCS: GCS eye subscore is 4. GCS verbal subscore is 5. GCS motor subscore is 6.     Cranial Nerves: No cranial nerve deficit, dysarthria or facial asymmetry.     Motor: Motor function is intact. No weakness, tremor, atrophy, abnormal muscle tone or seizure activity.     Coordination: Coordination is intact. Coordination normal.     Gait: Gait is intact. Gait normal.     Comments: In/out of chair without difficulty; gait sure and steady in clinic; bilateral hand grasp equal 5/5  Psychiatric:        Attention and Perception: Attention and perception normal.        Mood and Affect: Mood and affect normal.        Speech: Speech normal.        Behavior: Behavior normal. Behavior is cooperative.        Thought Content: Thought content normal.        Cognition and Memory: Cognition and memory normal.        Judgment: Judgment normal.        Latest Reference Range & Units 03/31/21 09:09  Magnesium 1.6 - 2.3 mg/dL 2.2      Assessment & Plan:   A-preventative health care, GERD  P-will send my chart message with lab results once available from [facility].  Patient to see RN Thersia Flax if she wants printed copy or results sent electronically to [facility] later this week.  [name] [employer] form given to HR Tonya 06/24/2023  Patient nonsmoker/nonvaper in previous 12 months.  NP signed [name] 2026 forms including [employer].  Patient verbalized understanding information/instructions and had no further questions at that time.  Omeprazole  DR 20mg  po daily refilled 90 capsules from [facility] to patient today.  Last magnesium recheck 2023 ordered for recheck in the next 6-12 months as chronic use previous magnesium level normal.  Patient reported not needing daily for heartburn/GERD takes prn.  Patient verbalized understanding information/instructions, agreed with plan of care and had no further questions at this time.

## 2023-06-29 NOTE — Progress Notes (Signed)
 [name] Labs

## 2023-06-29 NOTE — Telephone Encounter (Signed)
 Patient seen in clinic 06/29/2023 see office note.  Patient reported she received the one time employer weight loss bonus many years ago.  Discussed that this is a one time not annual benefit at this time with patient.

## 2023-06-30 ENCOUNTER — Encounter: Payer: Self-pay | Admitting: Registered Nurse

## 2023-06-30 LAB — CMP12+LP+TP+TSH+6AC+CBC/D/PLT
ALT: 27 IU/L (ref 0–32)
AST: 16 IU/L (ref 0–40)
Albumin: 4.6 g/dL (ref 3.9–4.9)
Alkaline Phosphatase: 97 IU/L (ref 44–121)
BUN/Creatinine Ratio: 22 (ref 12–28)
BUN: 14 mg/dL (ref 8–27)
Basophils Absolute: 0 10*3/uL (ref 0.0–0.2)
Basos: 1 %
Bilirubin Total: 0.3 mg/dL (ref 0.0–1.2)
Calcium: 9.9 mg/dL (ref 8.7–10.3)
Chloride: 97 mmol/L (ref 96–106)
Chol/HDL Ratio: 4.2 ratio (ref 0.0–4.4)
Cholesterol, Total: 200 mg/dL — ABNORMAL HIGH (ref 100–199)
Creatinine, Ser: 0.65 mg/dL (ref 0.57–1.00)
EOS (ABSOLUTE): 0.1 10*3/uL (ref 0.0–0.4)
Eos: 1 %
Estimated CHD Risk: 0.9 times avg. (ref 0.0–1.0)
Free Thyroxine Index: 1.8 (ref 1.2–4.9)
GGT: 24 IU/L (ref 0–60)
Globulin, Total: 1.7 g/dL (ref 1.5–4.5)
Glucose: 99 mg/dL (ref 70–99)
HDL: 48 mg/dL (ref >39)
Hematocrit: 45 % (ref 34.0–46.6)
Hemoglobin: 14.5 g/dL (ref 11.1–15.9)
Immature Grans (Abs): 0 10*3/uL (ref 0.0–0.1)
Immature Granulocytes: 0 %
Iron: 78 ug/dL (ref 27–139)
LDH: 144 IU/L (ref 119–226)
LDL Chol Calc (NIH): 132 mg/dL — ABNORMAL HIGH (ref 0–99)
Lymphocytes Absolute: 1.7 10*3/uL (ref 0.7–3.1)
Lymphs: 33 %
MCH: 31 pg (ref 26.6–33.0)
MCHC: 32.2 g/dL (ref 31.5–35.7)
MCV: 96 fL (ref 79–97)
Monocytes Absolute: 0.4 10*3/uL (ref 0.1–0.9)
Monocytes: 8 %
Neutrophils Absolute: 2.9 10*3/uL (ref 1.4–7.0)
Neutrophils: 57 %
Phosphorus: 3.9 mg/dL (ref 3.0–4.3)
Platelets: 241 10*3/uL (ref 150–450)
Potassium: 4.8 mmol/L (ref 3.5–5.2)
RBC: 4.68 x10E6/uL (ref 3.77–5.28)
RDW: 14.1 % (ref 11.7–15.4)
Sodium: 135 mmol/L (ref 134–144)
T3 Uptake Ratio: 25 % (ref 24–39)
T4, Total: 7.2 ug/dL (ref 4.5–12.0)
TSH: 1 u[IU]/mL (ref 0.450–4.500)
Total Protein: 6.3 g/dL (ref 6.0–8.5)
Triglycerides: 111 mg/dL (ref 0–149)
Uric Acid: 4.2 mg/dL (ref 3.0–7.2)
VLDL Cholesterol Cal: 20 mg/dL (ref 5–40)
WBC: 5.1 10*3/uL (ref 3.4–10.8)
eGFR: 99 mL/min/{1.73_m2} (ref >59)

## 2023-06-30 LAB — HEMOGLOBIN A1C
Est. average glucose Bld gHb Est-mCnc: 117 mg/dL
Hgb A1c MFr Bld: 5.7 % — ABNORMAL HIGH (ref 4.8–5.6)

## 2023-06-30 NOTE — Addendum Note (Signed)
 Addended by: Sherilyn Dings A on: 06/30/2023 08:38 PM   Modules accepted: Orders

## 2023-07-01 ENCOUNTER — Encounter: Payer: Self-pay | Admitting: Family Medicine

## 2023-07-01 ENCOUNTER — Ambulatory Visit: Payer: No Typology Code available for payment source | Admitting: Family Medicine

## 2023-07-01 VITALS — BP 122/67 | HR 73 | Temp 97.9°F | Ht <= 58 in | Wt 169.6 lb

## 2023-07-01 DIAGNOSIS — K219 Gastro-esophageal reflux disease without esophagitis: Secondary | ICD-10-CM

## 2023-07-01 DIAGNOSIS — K760 Fatty (change of) liver, not elsewhere classified: Secondary | ICD-10-CM

## 2023-07-01 DIAGNOSIS — Z0000 Encounter for general adult medical examination without abnormal findings: Secondary | ICD-10-CM | POA: Diagnosis not present

## 2023-07-01 DIAGNOSIS — R7303 Prediabetes: Secondary | ICD-10-CM | POA: Diagnosis not present

## 2023-07-01 DIAGNOSIS — Z0001 Encounter for general adult medical examination with abnormal findings: Secondary | ICD-10-CM

## 2023-07-01 DIAGNOSIS — E559 Vitamin D deficiency, unspecified: Secondary | ICD-10-CM

## 2023-07-01 DIAGNOSIS — D126 Benign neoplasm of colon, unspecified: Secondary | ICD-10-CM

## 2023-07-01 NOTE — Progress Notes (Signed)
 Subjective  Chief Complaint  Patient presents with   Annual Exam    Pt here for Annual exam and is currently fasting. Blood work has already been done. Pap is scheduled for 08/30/2023    HPI: Sandra Lara is a 63 y.o. female who presents to [facility] [place] [facility] today for a Female Wellness Visit. She also has the concerns and/or needs as listed above in the chief complaint. These will be addressed in addition to the Health Maintenance Visit.   Wellness Visit: annual visit with health maintenance review and exam  HM: sees gyn and has wellness/pap scheduled for June, Dr. Leslee Rase. Imms, eye exam up to date. Doing well. CRC screen current (q 5 year screens due to h/o adenoma, next due 2027) Chronic disease f/u and/or acute problem visit: (deemed necessary to be done in addition to the wellness visit): Obesity: Doing very well with healthy weight wellness.  Feels great.  Following low calorie diet, good hydration and healthy proteins.  Exercising.  Very happy with success.  I reviewed recent lab work from recent office visit.  See below. History of hepatic steatosis without right upper quadrant pain.  Recent LFTs are normal.  Eating low fat and low processed carbs GERD is well-controlled with chronic PPI.  Due to check B12 levels. History of vitamin D  deficiency Prediabetes: A1c down to 5.7 from a high of 6.0 I believe.  Healthy diet improving numbers.  Assessment  1. Encounter for well adult exam with abnormal findings   2. Hepatic steatosis   3. Gastroesophageal reflux disease, unspecified whether esophagitis present   4. Morbid obesity (HCC)   5. Prediabetes   6. Vitamin D  deficiency   7. Tubular adenoma of colon      Plan  Female Wellness Visit: Age appropriate Health Maintenance and Prevention measures were discussed with patient. Included topics are cancer screening recommendations, ways to keep healthy (see AVS) including dietary and exercise recommendations,  regular eye and dental care, use of seat belts, and avoidance of moderate alcohol use and tobacco use.  Pap smear due and scheduled.  Other screens current BMI: discussed patient's BMI and encouraged positive lifestyle modifications to help get to or maintain a target BMI. HM needs and immunizations were addressed and ordered. See below for orders. See HM and immunization section for updates.  Up-to-date Routine labs and screening tests ordered including cmp, cbc and lipids where appropriate. Discussed recommendations regarding Vit D and calcium supplementation (see AVS)  Chronic disease management visit and/or acute problem visit: LFTs normal with hepatic steatosis.  Healthy diet Obesity improving.  Discussed diet exercise and possible use of GLP-1's if needed. Prediabetes is improving.  Continue weight loss and healthy diet Discussed ways to improve HDL Continue vitamin D  supplementation Continue chronic PPI  Follow up: 1 year for complete physical No orders of the defined types were placed in this encounter.  No orders of the defined types were placed in this encounter.     Body mass index is 36.07 kg/m. Wt Readings from Last 3 Encounters:  07/01/23 169 lb 9.6 oz (76.9 kg)  06/23/23 172 lb (78 kg)  06/10/23 166 lb (75.3 kg)     Patient Active Problem List   Diagnosis Date Noted Date Diagnosed   OSA on CPAP 01/14/2023     Priority: High   Prediabetes 12/23/2022     Priority: High   Hepatic steatosis 07/21/2022     Priority: High    By ultrasound  07/2022; normal LFTs; rec dietary changes    DJD (degenerative joint disease), lumbar 08/26/2017     Priority: Medium     Xray 2017    Tubular adenoma of colon 11/26/2015     Priority: Medium     Repeat q 5 years. Next 2027     Diverticulosis of large intestine 05/24/2015     Priority: Medium     Colonoscopy - has had recurrent diverticulitis x 3 in 2016     Gastroesophageal reflux disease 09/28/2014     Priority:  Medium    Greater trochanteric bursitis of left hip 09/28/2014     Priority: Medium    Menopause syndrome 09/28/2014     Priority: Medium     Marykay Snipes MD - [facility]     Vitamin D  deficiency 01/14/2023     Priority: Low   Dyshidrotic eczema 06/30/2022     Priority: Low   Seasonal allergic rhinitis due to pollen 05/21/2016     Priority: Low   Morbid obesity (HCC) 10/29/2016    Health Maintenance  Topic Date Due   Cervical Cancer Screening (HPV/Pap Cotest)  03/09/2020   COVID-19 Vaccine (2 - 2024-25 season) 07/17/2023 (Originally 11/08/2022)   INFLUENZA VACCINE  10/08/2023   MAMMOGRAM  04/15/2024   DTaP/Tdap/Td (2 - Td or Tdap) 09/27/2024   Colonoscopy  11/21/2025   Hepatitis C Screening  Completed   HIV Screening  Completed   Zoster Vaccines- Shingrix   Completed   HPV VACCINES  Aged Out   Meningococcal B Vaccine  Aged Out   Immunization History  Administered Date(s) Administered   Influenza,inj,Quad PF,6+ Mos 03/19/2016, 12/14/2017, 01/23/2019, 12/10/2020   Influenza-Unspecified 12/26/2019, 11/28/2021   Pfizer Covid-19 Vaccine Bivalent Booster 89yrs & up 02/03/2022   RSV,unspecified 02/03/2022   Tdap 09/28/2014   Zoster Recombinant(Shingrix ) 06/30/2022, 12/19/2022   We updated and reviewed the patient's past history in detail and it is documented below. Allergies: Patient is allergic to sulfamethoxazole-trimethoprim, doxycycline , erythromycin, achromycin [tetracycline], and macrolides and ketolides. Past Medical History Patient  has a past medical history of Allergy, Back pain, Diverticulitis, Diverticulitis, Family history of polyps in the colon, Fatty liver, GERD (gastroesophageal reflux disease), Hyperlipidemia, IBS (irritable bowel syndrome), Joint pain, OSA on CPAP, Pre-diabetes, and Sleep apnea. Past Surgical History Patient  has a past surgical history that includes Cholecystectomy (2005); Wrist fracture surgery (Right); Gallbladder surgery (2005);  Bone graft hip iliac crest; colon polyps ; and prolapse. Family History: Patient family history includes Arthritis in her mother; Breast cancer (age of onset: 44) in her mother; COPD in her mother; Cancer in her mother; Diabetes in her maternal grandfather; Hearing loss in her mother; Heart attack in her brother and maternal grandfather; Hyperlipidemia in her mother; Stroke in her maternal grandmother. Social History:  Patient  reports that she quit smoking about 11 years ago. Her smoking use included cigarettes. She has never used smokeless tobacco. She reports current alcohol use of about 7.0 standard drinks of alcohol per week. She reports that she does not use drugs.  Review of Systems: Constitutional: negative for fever or malaise Ophthalmic: negative for photophobia, double vision or loss of vision Cardiovascular: negative for chest pain, dyspnea on exertion, or new LE swelling Respiratory: negative for SOB or persistent cough Gastrointestinal: negative for abdominal pain, change in bowel habits or melena Genitourinary: negative for dysuria or gross hematuria, no abnormal uterine bleeding or disharge Musculoskeletal: negative for new gait disturbance or muscular weakness Integumentary: negative for new or  persistent rashes, no breast lumps Neurological: negative for TIA or stroke symptoms Psychiatric: negative for SI or delusions Allergic/Immunologic: negative for hives  Patient Care Team    Relationship Specialty Notifications Start End  Luevenia Saha, MD PCP - General Family Medicine  08/26/17   Mechele Spiegel, MD Referring Physician Gastroenterology  08/26/17   Devin Foerster, MD Consulting Physician Ophthalmology  08/26/17   Dr. Lanier Pitch    08/26/17    Comment: Dentist, [phone]  Matt Song, MD Consulting Physician Obstetrics and Gynecology  06/30/22     Objective  Vitals: BP 122/67   Pulse 73   Temp 97.9 F (36.6 C)   Ht 4' 9.5" (1.461 m)   Wt 169 lb 9.6  oz (76.9 kg)   SpO2 98%   BMI 36.07 kg/m  General:  Well developed, well nourished, no acute distress  Psych:  Alert and orientedx3,normal mood and affect HEENT:  Normocephalic, atraumatic, non-icteric sclera,  supple neck without adenopathy, mass or thyromegaly Cardiovascular:  Normal S1, S2, RRR without gallop, rub or murmur Respiratory:  Good breath sounds bilaterally, CTAB with normal respiratory effort Gastrointestinal: normal bowel sounds, soft, non-tender, no noted masses. No HSM MSK: extremities without edema, joints without erythema or swelling Neurologic:    Mental status is normal.  Gross motor and sensory exams are normal.  No tremor  No visits with results within 1 Day(s) from this visit.  Latest known visit with results is:  Office Visit on 06/29/2023  Component Date Value Ref Range Status   Glucose 06/29/2023 99  70 - 99 mg/dL Final   Uric Acid 09/81/1914 4.2  3.0 - 7.2 mg/dL Final   BUN 78/29/5621 14  8 - 27 mg/dL Final   Creatinine, Ser 06/29/2023 0.65  0.57 - 1.00 mg/dL Final   eGFR 30/86/5784 99  >59 mL/min/1.73 Final   BUN/Creatinine Ratio 06/29/2023 22  12 - 28 Final   Sodium 06/29/2023 135  134 - 144 mmol/L Final   Potassium 06/29/2023 4.8  3.5 - 5.2 mmol/L Final   Chloride 06/29/2023 97  96 - 106 mmol/L Final   Calcium 06/29/2023 9.9  8.7 - 10.3 mg/dL Final   Phosphorus 69/62/9528 3.9  3.0 - 4.3 mg/dL Final   Total Protein 41/32/4401 6.3  6.0 - 8.5 g/dL Final   Albumin 02/72/5366 4.6  3.9 - 4.9 g/dL Final   Globulin, Total 06/29/2023 1.7  1.5 - 4.5 g/dL Final   Bilirubin Total 06/29/2023 0.3  0.0 - 1.2 mg/dL Final   Alkaline Phosphatase 06/29/2023 97  44 - 121 IU/L Final   LDH 06/29/2023 144  119 - 226 IU/L Final   AST 06/29/2023 16  0 - 40 IU/L Final   ALT 06/29/2023 27  0 - 32 IU/L Final   GGT 06/29/2023 24  0 - 60 IU/L Final   Iron 06/29/2023 78  27 - 139 ug/dL Final   Cholesterol, Total 06/29/2023 200 (H)  100 - 199 mg/dL Final   Triglycerides  06/29/2023 111  0 - 149 mg/dL Final   HDL 44/05/4740 48  >39 mg/dL Final   VLDL Cholesterol Cal 06/29/2023 20  5 - 40 mg/dL Final   LDL Chol Calc (NIH) 06/29/2023 132 (H)  0 - 99 mg/dL Final   Chol/HDL Ratio 06/29/2023 4.2  0.0 - 4.4 ratio Final   Estimated CHD Risk 06/29/2023 0.9  0.0 - 1.0 times avg. Final   TSH 06/29/2023 1.000  0.450 - 4.500 uIU/mL Final   T4,  Total 06/29/2023 7.2  4.5 - 12.0 ug/dL Final   T3 Uptake Ratio 06/29/2023 25  24 - 39 % Final   Free Thyroxine Index 06/29/2023 1.8  1.2 - 4.9 Final   WBC 06/29/2023 5.1  3.4 - 10.8 x10E3/uL Final   RBC 06/29/2023 4.68  3.77 - 5.28 x10E6/uL Final   Hemoglobin 06/29/2023 14.5  11.1 - 15.9 g/dL Final   Hematocrit 16/12/9602 45.0  34.0 - 46.6 % Final   MCV 06/29/2023 96  79 - 97 fL Final   MCH 06/29/2023 31.0  26.6 - 33.0 pg Final   MCHC 06/29/2023 32.2  31.5 - 35.7 g/dL Final   RDW 54/11/8117 14.1  11.7 - 15.4 % Final   Platelets 06/29/2023 241  150 - 450 x10E3/uL Final   Neutrophils 06/29/2023 57  Not Estab. % Final   Lymphs 06/29/2023 33  Not Estab. % Final   Monocytes 06/29/2023 8  Not Estab. % Final   Eos 06/29/2023 1  Not Estab. % Final   Basos 06/29/2023 1  Not Estab. % Final   Neutrophils Absolute 06/29/2023 2.9  1.4 - 7.0 x10E3/uL Final   Lymphocytes Absolute 06/29/2023 1.7  0.7 - 3.1 x10E3/uL Final   Monocytes Absolute 06/29/2023 0.4  0.1 - 0.9 x10E3/uL Final   EOS (ABSOLUTE) 06/29/2023 0.1  0.0 - 0.4 x10E3/uL Final   Basophils Absolute 06/29/2023 0.0  0.0 - 0.2 x10E3/uL Final   Immature Granulocytes 06/29/2023 0  Not Estab. % Final   Immature Grans (Abs) 06/29/2023 0.0  0.0 - 0.1 x10E3/uL Final   Hgb A1c MFr Bld 06/29/2023 5.7 (H)  4.8 - 5.6 % Final   Est. average glucose Bld gHb Est-m* 06/29/2023 117  mg/dL Final     Commons side effects, risks, benefits, and alternatives for medications and treatment plan prescribed today were discussed, and the patient expressed understanding of the given instructions. Patient  is instructed to call or message via MyChart if he/she has any questions or concerns regarding our treatment plan. No barriers to understanding were identified. We discussed Red Flag symptoms and signs in detail. Patient expressed understanding regarding what to do in case of urgent or emergency type symptoms.  Medication list was reconciled, printed and provided to the patient in AVS. Patient instructions and summary information was reviewed with the patient as documented in the AVS. This note was prepared with assistance of Dragon voice recognition software. Occasional wrong-word or sound-a-like substitutions may have occurred due to the inherent limitations of voice recognition software

## 2023-07-01 NOTE — Patient Instructions (Signed)
Please return in 12 months for your annual complete physical; please come fasting.   If you have any questions or concerns, please don't hesitate to send me a message via MyChart or call the office at [phone]. Thank you for visiting with us today! It's our pleasure caring for you.  Please do these things to maintain good health!   Exercise at least 30-45 minutes a day,  4-5 days a week.   Eat a low-fat diet with lots of fruits and vegetables, up to 7-9 servings per day.  Drink plenty of water daily. Try to drink 8 8oz glasses per day.  Seatbelts can save your life. Always wear your seatbelt.  Place Smoke Detectors on every level of your home and check batteries every year.  Schedule an appointment with an eye doctor for an eye exam every 1-2 years  Safe sex - use condoms to protect yourself from STDs if you could be exposed to these types of infections. Use birth control if you do not want to become pregnant and are sexually active.  Avoid heavy alcohol use. If you drink, keep it to less than 2 drinks/day and not every day.  Health Care Power of Attorney.  Choose someone you trust that could speak for you if you became unable to speak for yourself.  Depression is common in our stressful world.If you're feeling down or losing interest in things you normally enjoy, please come in for a visit.  If anyone is threatening or hurting you, please get help. Physical or Emotional Violence is never OK.   

## 2023-07-15 ENCOUNTER — Other Ambulatory Visit (HOSPITAL_BASED_OUTPATIENT_CLINIC_OR_DEPARTMENT_OTHER): Payer: Self-pay

## 2023-07-15 ENCOUNTER — Encounter: Payer: Self-pay | Admitting: Family Medicine

## 2023-07-15 ENCOUNTER — Ambulatory Visit: Admitting: Family Medicine

## 2023-07-15 ENCOUNTER — Other Ambulatory Visit: Payer: Self-pay | Admitting: Family Medicine

## 2023-07-15 VITALS — BP 124/83 | HR 78 | Temp 98.7°F | Ht <= 58 in | Wt 166.0 lb

## 2023-07-15 DIAGNOSIS — M1612 Unilateral primary osteoarthritis, left hip: Secondary | ICD-10-CM

## 2023-07-15 DIAGNOSIS — K760 Fatty (change of) liver, not elsewhere classified: Secondary | ICD-10-CM | POA: Diagnosis not present

## 2023-07-15 DIAGNOSIS — Z6835 Body mass index (BMI) 35.0-35.9, adult: Secondary | ICD-10-CM

## 2023-07-15 DIAGNOSIS — R7303 Prediabetes: Secondary | ICD-10-CM | POA: Diagnosis not present

## 2023-07-15 DIAGNOSIS — E66812 Obesity, class 2: Secondary | ICD-10-CM

## 2023-07-15 MED ORDER — ZEPBOUND 2.5 MG/0.5ML ~~LOC~~ SOAJ
2.5000 mg | SUBCUTANEOUS | 0 refills | Status: DC
Start: 2023-07-15 — End: 2023-08-16
  Filled 2023-07-15 – 2023-07-16 (×2): qty 2, 28d supply, fill #0

## 2023-07-15 MED ORDER — ZEPBOUND 2.5 MG/0.5ML ~~LOC~~ SOAJ
2.5000 mg | SUBCUTANEOUS | 0 refills | Status: DC
Start: 2023-07-15 — End: 2023-07-15

## 2023-07-15 NOTE — Progress Notes (Signed)
 Office: [phone]  /  Fax: [phone]  WEIGHT SUMMARY AND BIOMETRICS  Starting Date: 01/14/23  Starting Weight: 194lb   Weight Lost Since Last Visit: 0lb   Vitals Temp: 98.7 F (37.1 C) BP: 124/83 Pulse Rate: 78 SpO2: 98 %   Body Composition  Body Fat %: 43.1 % Fat Mass (lbs): 71.6 lbs Muscle Mass (lbs): 89.6 lbs Total Body Water (lbs): 67.2 lbs Visceral Fat Rating : 13     HPI  Chief Complaint: OBESITY  Sandra Lara is here to discuss her progress with her obesity treatment plan. She is on the the Category 1 Plan and states she is following her eating plan approximately 90 % of the time. She states she is exercising 0 minutes 0 times per week.  Interval History:  Since last office visit she is down 0 lb This gives her a net weight loss of 28 lb in the past 6 mos of medically supervised weight management without use of AOMs This is a 14.4% TBW loss She has been limited her exercise due to continued L hip and L knee pain She denies large food volumes at mealtime, hunger or cravings Denies stress eating She has been snacking on nuts She has been working on getting in 100+ oz of water She did add in one glass of red wine daily  Pharmacotherapy: none  PHYSICAL EXAM:  Blood pressure 124/83, pulse 78, temperature 98.7 F (37.1 C), height 4\' 9"  (1.448 m), weight 166 lb (75.3 kg), SpO2 98%. Body mass index is 35.92 kg/m.  General: She is overweight, cooperative, alert, well developed, and in no acute distress. PSYCH: Has normal mood, affect and thought process.   Lungs: Normal breathing effort, no conversational dyspnea.   ASSESSMENT AND PLAN  TREATMENT PLAN FOR OBESITY:  Recommended Dietary Goals  [name] is currently in the action stage of change. As such, her goal is to continue weight management plan. She has agreed to the Category 1 Plan.  Behavioral Intervention  We discussed the following Behavioral Modification Strategies today: increasing  lean protein intake to established goals, increasing fiber rich foods, increasing water intake , work on meal planning and preparation, keeping healthy foods at home, practice mindfulness eating and understand the difference between hunger signals and cravings, work on managing stress, creating time for self-care and relaxation, avoiding temptations and identifying enticing environmental cues, planning for success, and continue to work on maintaining a reduced calorie state, getting the recommended amount of protein, incorporating whole foods, making healthy choices, staying well hydrated and practicing mindfulness when eating..  Additional resources provided today: NA  Recommended Physical Activity Goals  [name] has been advised to work up to 150 minutes of moderate intensity aerobic activity a week and strengthening exercises 2-3 times per week for cardiovascular health, weight loss maintenance and preservation of muscle mass.   She has agreed to Start aerobic activity with a goal of 150 minutes a week at moderate intensity.   Pharmacotherapy changes for the treatment of obesity: begin Zepbound 2.5 mg weekly Patient denies a personal or family history of pancreatitis, medullary thyroid carcinoma or multiple endocrine neoplasia type II. Recommend reviewing pen training video online.  ASSOCIATED CONDITIONS ADDRESSED TODAY  Primary osteoarthritis of left hip Reviewed notes from Dr Darden Eaves 07/01/23 Using Celebrex prn for pain Walking has been limited L hip pain is radiating to L knee, Xrays of both updated at ortho visit  Continue active plan for weight loss Encouraged bike, water exercise, weight training for exercise  with walking as tolerated  Class 2 severe obesity due to excess calories with serious comorbidity and body mass index (BMI) of 35.0 to 35.9 in adult (HCC) -     Zepbound; Inject 2.5 mg into the skin once a week.  Dispense: 2 mL; Refill: 0 Reviewed [email or web address], MOA and  potential adverse SE of Zepbound Cut out ETOH Avoid high fat, greasy foods, high sugar items   Prediabetes Lab Results  Component Value Date   HGBA1C 5.7 (H) 06/29/2023   Improving Great job with A1c reduction with lifestyle changes and weight reduction  Hepatic steatosis Lab Results  Component Value Date   ALT 27 06/29/2023   Down 14.4% TBW in 6 mos of medically supervised weight management now with normal LFTs     She was informed of the importance of frequent follow up visits to maximize her success with intensive lifestyle modifications for her multiple health conditions.   ATTESTASTION STATEMENTS:  Reviewed by clinician on day of visit: allergies, medications, problem list, medical history, surgical history, family history, social history, and previous encounter notes pertinent to obesity diagnosis.   I have personally spent 30 minutes total time today in preparation, patient care, nutritional counseling and education,  and documentation for this visit, including the following: review of most recent clinical lab tests, prescribing medications/ refilling medications, reviewing medical assistant documentation, review and interpretation of bioimpedence results.     Micky Albee, D.O. DABFM, DABOM [facility] [address] [place], [place] [address] [phone]

## 2023-07-16 ENCOUNTER — Other Ambulatory Visit (HOSPITAL_BASED_OUTPATIENT_CLINIC_OR_DEPARTMENT_OTHER): Payer: Self-pay

## 2023-07-19 ENCOUNTER — Other Ambulatory Visit (HOSPITAL_BASED_OUTPATIENT_CLINIC_OR_DEPARTMENT_OTHER): Payer: Self-pay

## 2023-07-20 ENCOUNTER — Telehealth: Payer: Self-pay | Admitting: Registered Nurse

## 2023-07-20 ENCOUNTER — Encounter: Payer: Self-pay | Admitting: Registered Nurse

## 2023-07-20 DIAGNOSIS — L209 Atopic dermatitis, unspecified: Secondary | ICD-10-CM

## 2023-07-20 MED ORDER — CLOTRIMAZOLE-BETAMETHASONE 1-0.05 % EX CREA: 1.0000 | TOPICAL_CREAM | Freq: Two times a day (BID) | CUTANEOUS | 0 refills | Status: AC | PRN

## 2023-07-20 NOTE — Telephone Encounter (Signed)
 Patient uses clotrimazole  bethamethazone topical BID prn eczema rash.  Current flare up on toes ran out of refill last Rx from [facility] 06/30/22 30gram tube tried to refill yesterday but refill expired requested new Rx be sent to her pharmacy of choice.  Electronic Rx sent today for 30g tube for patient discussed may use BID topical x 2 weeks but if rash not improving follow up with provider for re-evaluation.  Patient agreed with plan of care and had no further questions at this time.

## 2023-07-30 ENCOUNTER — Other Ambulatory Visit (HOSPITAL_BASED_OUTPATIENT_CLINIC_OR_DEPARTMENT_OTHER): Payer: Self-pay

## 2023-08-07 ENCOUNTER — Other Ambulatory Visit (HOSPITAL_BASED_OUTPATIENT_CLINIC_OR_DEPARTMENT_OTHER): Payer: Self-pay

## 2023-08-09 ENCOUNTER — Other Ambulatory Visit (HOSPITAL_BASED_OUTPATIENT_CLINIC_OR_DEPARTMENT_OTHER): Payer: Self-pay

## 2023-08-09 ENCOUNTER — Telehealth (INDEPENDENT_AMBULATORY_CARE_PROVIDER_SITE_OTHER): Payer: Self-pay

## 2023-08-09 NOTE — Telephone Encounter (Signed)
 Gay Moncivais (Key: [id number]) Rx #: [id number] Zepbound  2.5MG /0.5ML pen-injectors Message from [facility] is covered by current benefit plan. No further PA activity needed

## 2023-08-11 ENCOUNTER — Other Ambulatory Visit (HOSPITAL_BASED_OUTPATIENT_CLINIC_OR_DEPARTMENT_OTHER): Payer: Self-pay

## 2023-08-16 ENCOUNTER — Other Ambulatory Visit (HOSPITAL_BASED_OUTPATIENT_CLINIC_OR_DEPARTMENT_OTHER): Payer: Self-pay

## 2023-08-16 ENCOUNTER — Ambulatory Visit: Admitting: Family Medicine

## 2023-08-16 ENCOUNTER — Encounter: Payer: Self-pay | Admitting: Family Medicine

## 2023-08-16 VITALS — BP 140/84 | HR 82 | Temp 98.3°F | Ht <= 58 in | Wt 162.0 lb

## 2023-08-16 DIAGNOSIS — R030 Elevated blood-pressure reading, without diagnosis of hypertension: Secondary | ICD-10-CM

## 2023-08-16 DIAGNOSIS — E66812 Obesity, class 2: Secondary | ICD-10-CM

## 2023-08-16 DIAGNOSIS — Z6835 Body mass index (BMI) 35.0-35.9, adult: Secondary | ICD-10-CM

## 2023-08-16 DIAGNOSIS — R7303 Prediabetes: Secondary | ICD-10-CM

## 2023-08-16 DIAGNOSIS — M1612 Unilateral primary osteoarthritis, left hip: Secondary | ICD-10-CM

## 2023-08-16 DIAGNOSIS — K760 Fatty (change of) liver, not elsewhere classified: Secondary | ICD-10-CM | POA: Diagnosis not present

## 2023-08-16 MED ORDER — ZEPBOUND 2.5 MG/0.5ML ~~LOC~~ SOAJ
2.5000 mg | SUBCUTANEOUS | 0 refills | Status: AC
Start: 2023-08-16 — End: ?
  Filled 2023-08-16 – 2023-08-26 (×2): qty 2, 28d supply, fill #0

## 2023-08-16 NOTE — Progress Notes (Signed)
 Office: [phone]  /  Fax: [phone]  WEIGHT SUMMARY AND BIOMETRICS  Starting Date: 01/14/23  Starting Weight: 194lb   Weight Lost Since Last Visit: 4lb   Vitals Temp: 98.3 F (36.8 C) BP: (!) 140/84 Pulse Rate: 82 SpO2: 100 %   Body Composition  Body Fat %: 40.6 % Fat Mass (lbs): 65.8 lbs Muscle Mass (lbs): 91.4 lbs Total Body Water (lbs): 63.8 lbs Visceral Fat Rating : 12   HPI  Chief Complaint: OBESITY  Sandra Lara is here to discuss her progress with her obesity treatment plan. She is on the the Category 1 Plan and states she is following her eating plan approximately 95 % of the time. She states she did a lot of walking on vacation last week.   Interval History:  Since last office visit she is down 4 lb She is up 1.8 lb of muscle mass and down 5.8 lb of body fat since last visit She did have a vacation to the [employer] since last visit where she was more active walking on the beach and up and down stairs with some increase in L hip pain She hasn't started Zepbound  yet (issues with the pharmacy) She is getting lean protein with all of her meals She now has net weight loss of 32 lb in the past 7 mos of medically supervised weight management This is a 16.4% TBW loss She is tracking her calorie intake She is having some L hip and knee pain with increasing walking  Pharmacotherapy: none  PHYSICAL EXAM:  Blood pressure (!) 140/84, pulse 82, temperature 98.3 F (36.8 C), height 4\' 9"  (1.448 m), weight 162 lb (73.5 kg), SpO2 100%. Body mass index is 35.06 kg/m.  General: She is overweight, cooperative, alert, well developed, and in no acute distress. PSYCH: Has normal mood, affect and thought process.   Lungs: Normal breathing effort, no conversational dyspnea.   ASSESSMENT AND PLAN  TREATMENT PLAN FOR OBESITY:  Recommended Dietary Goals  Sandra Lara is currently in the action stage of change. As such, her goal is to continue weight management  plan. She has agreed to the Category 2 Plan and keeping a food journal and adhering to recommended goals of 1200 calories and 85 g of  protein.  Behavioral Intervention  We discussed the following Behavioral Modification Strategies today: increasing lean protein intake to established goals, increasing fiber rich foods, increasing water intake , work on meal planning and preparation, work on tracking and journaling calories using tracking application, keeping healthy foods at home, decreasing eating out or consumption of processed foods, and making healthy choices when eating convenient foods, work on managing stress, creating time for self-care and relaxation, avoiding temptations and identifying enticing environmental cues, planning for success, and continue to work on maintaining a reduced calorie state, getting the recommended amount of protein, incorporating whole foods, making healthy choices, staying well hydrated and practicing mindfulness when eating..  Additional resources provided today: NA  Recommended Physical Activity Goals  Sanda has been advised to work up to 150 minutes of moderate intensity aerobic activity a week and strengthening exercises 2-3 times per week for cardiovascular health, weight loss maintenance and preservation of muscle mass.   She has agreed to Increase the intensity, frequency or duration of aerobic exercises    Pharmacotherapy changes for the treatment of obesity: re-sent RX for Zepbound  2.5 mg weekly to start  ASSOCIATED CONDITIONS ADDRESSED TODAY  Primary osteoarthritis of left hip Unchanged She is managed by Dr Darden Eaves,  considering an injection as L hip pain does hinder her ability to ramp up exercise for weight reduction She has successfully lost 32 lb in the past 7 mos Using Celebrex 200 mg daily prn pain  Class 2 severe obesity due to excess calories with serious comorbidity and body mass index (BMI) of 35.0 to 35.9 in adult (HCC) -      Zepbound ; Inject 2.5 mg into the skin once a week.  Dispense: 2 mL; Refill: 0  Prediabetes Lab Results  Component Value Date   HGBA1C 5.7 (H) 06/29/2023   Continue to work on reducing intake of sugar and starches, increasing regular exercise and weight reduction.  Anticipate improvements on Zepbound   Hepatic steatosis Lab Results  Component Value Date   ALT 27 06/29/2023   AST 16 06/29/2023   GGT 24 06/29/2023   ALKPHOS 97 06/29/2023   BILITOT 0.3 06/29/2023   Doing well with body fat reduction. Aim for a visceral fat rating <10, currently at 12  Elevated blood pressure reading BP is elevated today but stress levels have been higher Work on stress reduction, improving sleep and avoiding high sodium foods    She was informed of the importance of frequent follow up visits to maximize her success with intensive lifestyle modifications for her multiple health conditions.   ATTESTASTION STATEMENTS:  Reviewed by clinician on day of visit: allergies, medications, problem list, medical history, surgical history, family history, social history, and previous encounter notes pertinent to obesity diagnosis.   I have personally spent 30 minutes total time today in preparation, patient care, nutritional counseling and education,  and documentation for this visit, including the following: review of most recent clinical lab tests, prescribing medications/ refilling medications, reviewing medical assistant documentation, review and interpretation of bioimpedence results.     Micky Albee, D.O. DABFM, DABOM [facility] [address] [place], [place] [address] [phone]

## 2023-08-19 ENCOUNTER — Other Ambulatory Visit (HOSPITAL_COMMUNITY): Payer: Self-pay

## 2023-08-19 ENCOUNTER — Other Ambulatory Visit (HOSPITAL_BASED_OUTPATIENT_CLINIC_OR_DEPARTMENT_OTHER): Payer: Self-pay

## 2023-08-24 ENCOUNTER — Telehealth: Payer: Self-pay | Admitting: Registered Nurse

## 2023-08-24 ENCOUNTER — Encounter: Payer: Self-pay | Admitting: Registered Nurse

## 2023-08-24 DIAGNOSIS — M5136 Other intervertebral disc degeneration, lumbar region with discogenic back pain only: Secondary | ICD-10-CM

## 2023-08-24 DIAGNOSIS — M62830 Muscle spasm of back: Secondary | ICD-10-CM

## 2023-08-24 MED ORDER — PREDNISONE 10 MG PO TABS: ORAL_TABLET | ORAL | 0 refills | Status: AC

## 2023-08-24 MED ORDER — CYCLOBENZAPRINE HCL 10 MG PO TABS: 10.0000 mg | ORAL_TABLET | Freq: Three times a day (TID) | ORAL | 0 refills | Status: AC | PRN

## 2023-08-24 NOTE — Telephone Encounter (Signed)
 Last flexeril Rx 2019 cannot remember how it worked for her.  She remembers prednisone  taper helping and saw specialist for imaging.  Narrative & Impression  CLINICAL DATA:  Weakness in the right buttock radiating to the right thigh.   EXAM: MRI LUMBAR SPINE WITHOUT CONTRAST   TECHNIQUE: Multiplanar, multisequence MR imaging of the lumbar spine was performed. No intravenous contrast was administered.   COMPARISON:  None.   FINDINGS: Segmentation: S1 is described as a transitional vertebra. Correlation with this numbering scheme would be important should intervention be contemplated.   Alignment:  Exaggerated lumbosacral lordosis.   Vertebrae:  No fracture or primary bone lesion.   Conus medullaris and cauda equina: Conus extends to the L1 level. Conus and cauda equina appear normal.   Paraspinal and other soft tissues: Negative   Disc levels:   T12-L1: Disc degeneration with shallow herniation, indenting the thecal sac but not apparently compressing the neural structures.   L1-2: Mild noncompressive disc bulge.  No stenosis.   L2-3: Mild noncompressive disc bulge.  No stenosis.   L3-4: Disc degeneration with shallow disc protrusion more towards the left. No compressive central canal stenosis. Mild foraminal narrowing on the left without apparent compression of the exiting L3 nerve.   L4-5: Disc degeneration with loss of height. Endplate osteophytes and bulging of the disc. Mild facet hypertrophy. Mild stenosis of both lateral recesses and neural foramina but without distinct neural compression.   L5-S1: Disc degeneration with endplate osteophytes and bulging of the disc more towards the left. Facet and ligamentous hypertrophy. Narrowing of the subarticular lateral recesses left more than right. Mild foraminal narrowing on the left. Neural compression could occur on the left at this level.   S1-2: Transitional and normal appearing level.   IMPRESSION: S1 is  described as a transitional vertebra.   Degenerative disc disease at L3-4, L4-5 and L5-S1. No compressive central canal stenosis. Mild lateral recess and foraminal narrowing more notable on the left at L3-4 and L5-S1. At no level is definite neural compression is stab list. In particular, in this patient with right leg weakness, I do not see a clear explanation for right-sided symptoms.   Shallow disc herniation at T12-L1 indents the thecal sac but does not appear to cause neural compression     Electronically Signed   By: Bettylou Brunner M.D.   On: 08/31/2017 16:26    Was vacuuming prior to having pain this weekend.  Taking her celebrex po daily.  Using her biofreeze.  Called PCM and first available appt July.  cyclobenazeprine/flexeril 10mg  po TID prn muscle spasms #30 RF0 electronic rx to her pharmacy of choice may cut in half   prednisone  10mg  taper take with breakfast po 40mg x2 days 30mg x2days 20mg x2 days and 10mg x2 days #20 RF0 Tylenol  1000mg  po q6h prn pain.  Avoid alcohol intake and driving while taking cyclobenazeprine/flexeril as drowsiness common side effect.  Slow position changes as medication also lower blood pressure.  Home stretches discussed cat/cow back stretches and gentle AROM chin tucks, knee to chest and rock side to side on back. Self massage or professional prn, foam roller use or tennis/racquetball.  Heat/cryotherapy 15 minutes QID prn.   Consider physical therapy referral if no improvement with prescribed therapy from [facility] and/or chiropractic care.  Ensure ergonomics correct desk at work avoid repetitive motions if possible/holding phone/laptop in hand use desk/stand and/or break up lifting items into smaller loads/weights.  Patient was instructed to rest, ice, and ROM exercises.  Activity as  tolerated.   Follow up with provider in person if symptoms persist or worsen especially if loss of bowel/bladder control, arm/leg weakness and/or saddle paresthesias.  Exitcare handout on  muscle spasms and rehab exercises given to patient.  Patient verbalized agreement and understanding of treatment plan and had no further questions at this time.  P2:  Injury Prevention and Fitness.

## 2023-08-26 ENCOUNTER — Other Ambulatory Visit (HOSPITAL_BASED_OUTPATIENT_CLINIC_OR_DEPARTMENT_OTHER): Payer: Self-pay

## 2023-08-31 ENCOUNTER — Ambulatory Visit: Payer: Self-pay | Admitting: Registered Nurse

## 2023-09-06 ENCOUNTER — Other Ambulatory Visit (HOSPITAL_BASED_OUTPATIENT_CLINIC_OR_DEPARTMENT_OTHER): Payer: Self-pay

## 2023-09-13 ENCOUNTER — Ambulatory Visit: Admitting: Family Medicine

## 2023-09-22 ENCOUNTER — Ambulatory Visit: Admitting: Family Medicine

## 2023-09-28 ENCOUNTER — Telehealth: Payer: Self-pay | Admitting: Registered Nurse

## 2023-09-28 ENCOUNTER — Encounter: Payer: Self-pay | Admitting: Registered Nurse

## 2023-09-28 DIAGNOSIS — K219 Gastro-esophageal reflux disease without esophagitis: Secondary | ICD-10-CM

## 2023-09-28 NOTE — Telephone Encounter (Signed)
 [employer] form completed and given to HR Jen 06/29/23 for insurance discount starting 08 Dec 2023

## 2023-09-28 NOTE — Telephone Encounter (Signed)
 Patient requested  refill omeprazole  DR 20mg  po daily #90 RF0 last filled 06/29/23  Last labs    Latest Reference Range & Units 06/29/23 09:10  Sodium 134 - 144 mmol/L 135  Potassium 3.5 - 5.2 mmol/L 4.8  Chloride 96 - 106 mmol/L 97  Glucose 70 - 99 mg/dL 99  BUN 8 - 27 mg/dL 14  Creatinine 9.42 - 8.99 mg/dL 9.34  Calcium 8.7 - 89.6 mg/dL 9.9  BUN/Creatinine Ratio 12 - 28  22  eGFR >59 mL/min/1.73 99  Phosphorus 3.0 - 4.3 mg/dL 3.9  Alkaline Phosphatase 44 - 121 IU/L 97  Albumin 3.9 - 4.9 g/dL 4.6  Uric Acid 3.0 - 7.2 mg/dL 4.2  AST 0 - 40 IU/L 16  ALT 0 - 32 IU/L 27  Total Protein 6.0 - 8.5 g/dL 6.3  Total Bilirubin 0.0 - 1.2 mg/dL 0.3  GGT 0 - 60 IU/L 24  Estimated CHD Risk 0.0 - 1.0 times avg. 0.9  LDH 119 - 226 IU/L 144  Total CHOL/HDL Ratio 0.0 - 4.4 ratio 4.2  Cholesterol, Total 100 - 199 mg/dL 799 (H)  HDL Cholesterol >39 mg/dL 48  Triglycerides 0 - 850 mg/dL 888  VLDL Cholesterol Cal 5 - 40 mg/dL 20  LDL Chol Calc (NIH) 0 - 99 mg/dL 867 (H)  Iron 27 - 860 ug/dL 78  Globulin, Total 1.5 - 4.5 g/dL 1.7  WBC 3.4 - 89.1 k89Z6/lO 5.1  RBC 3.77 - 5.28 x10E6/uL 4.68  Hemoglobin 11.1 - 15.9 g/dL 85.4  HCT 65.9 - 53.3 % 45.0  MCV 79 - 97 fL 96  MCH 26.6 - 33.0 pg 31.0  MCHC 31.5 - 35.7 g/dL 67.7  RDW 88.2 - 84.5 % 14.1  Platelets 150 - 450 x10E3/uL 241  Neutrophils Not Estab. % 57  Immature Granulocytes Not Estab. % 0  NEUT# 1.4 - 7.0 x10E3/uL 2.9  Lymphs Abs 0.7 - 3.1 x10E3/uL 1.7  Monocytes Absolute 0.1 - 0.9 x10E3/uL 0.4  Basophils Absolute 0.0 - 0.2 x10E3/uL 0.0  Immature Grans (Abs) 0.0 - 0.1 x10E3/uL 0.0  Lymphs Not Estab. % 33  Monocytes Not Estab. % 8  Basos Not Estab. % 1  Eos Not Estab. % 1  EOS (ABSOLUTE) 0.0 - 0.4 x10E3/uL 0.1  Hemoglobin A1C 4.8 - 5.6 % 5.7 (H)  Est. average glucose Bld gHb Est-mCnc mg/dL 882  TSH 9.549 - 5.499 uIU/mL 1.000  Thyroxine (T4) 4.5 - 12.0 ug/dL 7.2  Free Thyroxine Index 1.2 - 4.9  1.8  T3 Uptake Ratio 24 - 39 % 25  (H):  Data is abnormally high Last magnesium level 2.2 03/31/21  Dispensed 90 tabs to patient today from [facility].

## 2023-09-28 NOTE — Telephone Encounter (Signed)
 Patient reported pain resolved after stretches and medication and rash resolved after cream use.  Denied further questions or concerns.  Gait sure and steady in clinic in/out of chair without difficulty bilateral hand grasp equal 5/5

## 2023-12-20 ENCOUNTER — Ambulatory Visit: Admitting: General Practice

## 2023-12-30 ENCOUNTER — Encounter: Payer: Self-pay | Admitting: Registered Nurse

## 2023-12-30 ENCOUNTER — Telehealth: Payer: Self-pay | Admitting: Registered Nurse

## 2023-12-30 DIAGNOSIS — K219 Gastro-esophageal reflux disease without esophagitis: Secondary | ICD-10-CM

## 2023-12-30 NOTE — Telephone Encounter (Signed)
 Patient requested  refill omeprazole  DR 20mg  po daily #90 RF0 last filled 09/28/23  Last labs      Latest Reference Range & Units 06/29/23 09:10  Sodium 134 - 144 mmol/L 135  Potassium 3.5 - 5.2 mmol/L 4.8  Chloride 96 - 106 mmol/L 97  Glucose 70 - 99 mg/dL 99  BUN 8 - 27 mg/dL 14  Creatinine 9.42 - 8.99 mg/dL 9.34  Calcium 8.7 - 89.6 mg/dL 9.9  BUN/Creatinine Ratio 12 - 28  22  eGFR >59 mL/min/1.73 99  Phosphorus 3.0 - 4.3 mg/dL 3.9  Alkaline Phosphatase 44 - 121 IU/L 97  Albumin 3.9 - 4.9 g/dL 4.6  Uric Acid 3.0 - 7.2 mg/dL 4.2  AST 0 - 40 IU/L 16  ALT 0 - 32 IU/L 27  Total Protein 6.0 - 8.5 g/dL 6.3  Total Bilirubin 0.0 - 1.2 mg/dL 0.3  GGT 0 - 60 IU/L 24  Estimated CHD Risk 0.0 - 1.0 times avg. 0.9  LDH 119 - 226 IU/L 144  Total CHOL/HDL Ratio 0.0 - 4.4 ratio 4.2  Cholesterol, Total 100 - 199 mg/dL 799 (H)  HDL Cholesterol >39 mg/dL 48  Triglycerides 0 - 850 mg/dL 888  VLDL Cholesterol Cal 5 - 40 mg/dL 20  LDL Chol Calc (NIH) 0 - 99 mg/dL 867 (H)  Iron 27 - 860 ug/dL 78  Globulin, Total 1.5 - 4.5 g/dL 1.7  WBC 3.4 - 89.1 k89Z6/lO 5.1  RBC 3.77 - 5.28 x10E6/uL 4.68  Hemoglobin 11.1 - 15.9 g/dL 85.4  HCT 65.9 - 53.3 % 45.0  MCV 79 - 97 fL 96  MCH 26.6 - 33.0 pg 31.0  MCHC 31.5 - 35.7 g/dL 67.7  RDW 88.2 - 84.5 % 14.1  Platelets 150 - 450 x10E3/uL 241  Neutrophils Not Estab. % 57  Immature Granulocytes Not Estab. % 0  NEUT# 1.4 - 7.0 x10E3/uL 2.9  Lymphs Abs 0.7 - 3.1 x10E3/uL 1.7  Monocytes Absolute 0.1 - 0.9 x10E3/uL 0.4  Basophils Absolute 0.0 - 0.2 x10E3/uL 0.0  Immature Grans (Abs) 0.0 - 0.1 x10E3/uL 0.0  Lymphs Not Estab. % 33  Monocytes Not Estab. % 8  Basos Not Estab. % 1  Eos Not Estab. % 1  EOS (ABSOLUTE) 0.0 - 0.4 x10E3/uL 0.1  Hemoglobin A1C 4.8 - 5.6 % 5.7 (H)  Est. average glucose Bld gHb Est-mCnc mg/dL 882  TSH 9.549 - 5.499 uIU/mL 1.000  Thyroxine (T4) 4.5 - 12.0 ug/dL 7.2  Free Thyroxine Index 1.2 - 4.9  1.8  T3 Uptake Ratio 24 - 39 % 25   (H): Data is abnormally high Last magnesium level 2.2 03/31/21   Dispensed 90 tabs to patient today from [facility].

## 2024-01-11 ENCOUNTER — Telehealth: Payer: Self-pay | Admitting: Registered Nurse

## 2024-01-11 ENCOUNTER — Ambulatory Visit: Admitting: General Practice

## 2024-01-11 VITALS — BP 135/90 | HR 68 | Temp 98.5°F | Resp 20

## 2024-01-11 DIAGNOSIS — R058 Other specified cough: Secondary | ICD-10-CM

## 2024-01-11 DIAGNOSIS — R059 Cough, unspecified: Secondary | ICD-10-CM | POA: Insufficient documentation

## 2024-01-11 DIAGNOSIS — J019 Acute sinusitis, unspecified: Secondary | ICD-10-CM

## 2024-01-11 DIAGNOSIS — R067 Sneezing: Secondary | ICD-10-CM | POA: Insufficient documentation

## 2024-01-11 MED ORDER — SALINE SPRAY 0.65 % NA SOLN: 2.0000 | NASAL | 0 refills | Status: AC

## 2024-01-11 MED ORDER — AMOXICILLIN 875 MG PO TABS: 875.0000 mg | ORAL_TABLET | Freq: Two times a day (BID) | ORAL | 0 refills | Status: AC

## 2024-01-11 NOTE — Telephone Encounter (Signed)
 Patient seen by OBIE Redhead in clinic today negative covid testing afebrile VSS  Mucous clear yellow.  Patient had tried nasal flonase  and allegra but didn't seem to be helping.  Had run out of nasal saline.  RN Redhead discussed trial of sudafed and mucinex with patient.  She is currently working remote and unable to return to clinic for in person evaluation at this time.  Discussed restart nasal saline use also because it sounds like this is viral URI or allergic rhinitis/sinusitis at this time.  Antibiotics do not help viral/allergic sinusitis.  1 Neil sinus rinse bottle/packet and bottle purified water given to patient from clinic stock.  Dispensed 20 tabs amoxicillin  875mg  po BID x 7 days to patient from [facility].  She is to start if cloudy tan mucous/pink opaque mucous.  Restart allegra 180mg  po daily and nasal saline 2 sprays each nostril q2h prn congestion/thick mucous.  Increase water intake.  Discussed allergies and viral URIs are common at this time in the community with ragweed/rain/leaves falling fungal increasing.  Re covid test if fever/chills/loss of taste/smell/sore throat/body aches.  Patient A&Ox3 spoke full sentences without difficulty nasal congestion/sniffing audible during 5 minute telephone call.  No audible cough/wheeze.  Patient agreed with plan of care and had no further questions at this time.

## 2024-01-11 NOTE — Progress Notes (Signed)
 Employee presents to the clinic today requesting a COVID test. Reports sneezing, sinus congestion, blowing out yellow blood tinged mucus from her nose, always having a cough. Denies a productive cough.  See VS, RN observed a dry cough, afebrile. Completed COVID test, negative result. Will schedule an appointment with clinic NP.

## 2024-03-28 ENCOUNTER — Encounter: Payer: Self-pay | Admitting: Registered Nurse

## 2024-03-28 ENCOUNTER — Telehealth: Payer: Self-pay | Admitting: Registered Nurse

## 2024-03-28 DIAGNOSIS — Z0000 Encounter for general adult medical examination without abnormal findings: Secondary | ICD-10-CM

## 2024-03-28 DIAGNOSIS — K219 Gastro-esophageal reflux disease without esophagitis: Secondary | ICD-10-CM

## 2024-03-28 DIAGNOSIS — R7303 Prediabetes: Secondary | ICD-10-CM

## 2024-03-28 NOTE — Telephone Encounter (Signed)
 Epic reminder Hgba1c recheck overdue.  Patient [name] labs due April 2026 female executive panel, Hgba1c and magnesium ordered for patient and RN Karene to schedule and complete [name] appt with patient.

## 2024-03-30 ENCOUNTER — Telehealth: Payer: Self-pay | Admitting: General Practice

## 2024-03-30 NOTE — Telephone Encounter (Signed)
 RN placed a call to Sandra Lara yesterday to schedule [facility] lab work. Per [name], she is planning to have hip surgery on 04/18/24. 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 She will complete health assessment at a later date or refer to pre-op labs.

## 2024-03-31 NOTE — Telephone Encounter (Signed)
 Notified by RN Karene 03/31/23 patient stated she is having surgery next week and does not want to come into clinic this week for labs.  She will follow up with clinic staff when she returns to work--date TBD

## 2024-07-03 ENCOUNTER — Encounter: Admitting: Family Medicine
